# Patient Record
Sex: Female | Born: 1955 | Race: White | Hispanic: No | Smoking: Never smoker
Health system: Southern US, Community
[De-identification: ages and names within clinical notes are randomized; demographics above are authoritative.]

## PROBLEM LIST (undated history)

## (undated) DIAGNOSIS — K219 Gastro-esophageal reflux disease without esophagitis: Secondary | ICD-10-CM

## (undated) DIAGNOSIS — I10 Essential (primary) hypertension: Secondary | ICD-10-CM

## (undated) DIAGNOSIS — N289 Disorder of kidney and ureter, unspecified: Secondary | ICD-10-CM

## (undated) DIAGNOSIS — F32A Depression, unspecified: Secondary | ICD-10-CM

## (undated) DIAGNOSIS — T7840XA Allergy, unspecified, initial encounter: Secondary | ICD-10-CM

## (undated) DIAGNOSIS — R011 Cardiac murmur, unspecified: Secondary | ICD-10-CM

## (undated) DIAGNOSIS — D649 Anemia, unspecified: Secondary | ICD-10-CM

## (undated) DIAGNOSIS — E039 Hypothyroidism, unspecified: Secondary | ICD-10-CM

## (undated) DIAGNOSIS — J45909 Unspecified asthma, uncomplicated: Secondary | ICD-10-CM

## (undated) DIAGNOSIS — E559 Vitamin D deficiency, unspecified: Secondary | ICD-10-CM

## (undated) DIAGNOSIS — K5792 Diverticulitis of intestine, part unspecified, without perforation or abscess without bleeding: Secondary | ICD-10-CM

## (undated) DIAGNOSIS — F329 Major depressive disorder, single episode, unspecified: Secondary | ICD-10-CM

## (undated) DIAGNOSIS — E785 Hyperlipidemia, unspecified: Secondary | ICD-10-CM

## (undated) DIAGNOSIS — G4700 Insomnia, unspecified: Secondary | ICD-10-CM

## (undated) DIAGNOSIS — N200 Calculus of kidney: Secondary | ICD-10-CM

## (undated) DIAGNOSIS — F419 Anxiety disorder, unspecified: Secondary | ICD-10-CM

## (undated) DIAGNOSIS — J309 Allergic rhinitis, unspecified: Secondary | ICD-10-CM

## (undated) DIAGNOSIS — Z780 Asymptomatic menopausal state: Secondary | ICD-10-CM

## (undated) DIAGNOSIS — K222 Esophageal obstruction: Secondary | ICD-10-CM

## (undated) DIAGNOSIS — K449 Diaphragmatic hernia without obstruction or gangrene: Secondary | ICD-10-CM

## (undated) HISTORY — DX: Depression, unspecified: F32.A

## (undated) HISTORY — DX: Cardiac murmur, unspecified: R01.1

## (undated) HISTORY — DX: Essential (primary) hypertension: I10

## (undated) HISTORY — DX: Insomnia, unspecified: G47.00

## (undated) HISTORY — DX: Gastro-esophageal reflux disease without esophagitis: K21.9

## (undated) HISTORY — DX: Allergic rhinitis, unspecified: J30.9

## (undated) HISTORY — DX: Vitamin D deficiency, unspecified: E55.9

## (undated) HISTORY — PX: RETINAL DETACHMENT SURGERY: SHX105

## (undated) HISTORY — PX: HEMORRHOID SURGERY: SHX153

## (undated) HISTORY — PX: STOMACH SURGERY: SHX791

## (undated) HISTORY — PX: UPPER GASTROINTESTINAL ENDOSCOPY: SHX188

## (undated) HISTORY — DX: Unspecified asthma, uncomplicated: J45.909

## (undated) HISTORY — DX: Anemia, unspecified: D64.9

## (undated) HISTORY — DX: Calculus of kidney: N20.0

## (undated) HISTORY — DX: Hypothyroidism, unspecified: E03.9

## (undated) HISTORY — DX: Asymptomatic menopausal state: Z78.0

## (undated) HISTORY — DX: Esophageal obstruction: K22.2

## (undated) HISTORY — DX: Diaphragmatic hernia without obstruction or gangrene: K44.9

## (undated) HISTORY — DX: Allergy, unspecified, initial encounter: T78.40XA

## (undated) HISTORY — DX: Disorder of kidney and ureter, unspecified: N28.9

## (undated) HISTORY — DX: Diverticulitis of intestine, part unspecified, without perforation or abscess without bleeding: K57.92

## (undated) HISTORY — DX: Major depressive disorder, single episode, unspecified: F32.9

## (undated) HISTORY — DX: Hyperlipidemia, unspecified: E78.5

## (undated) HISTORY — DX: Anxiety disorder, unspecified: F41.9

## (undated) HISTORY — PX: FOOT TENDON SURGERY: SHX958

---

## 1997-08-05 HISTORY — PX: NASAL SINUS SURGERY: SHX719

## 1998-03-10 ENCOUNTER — Other Ambulatory Visit: Admission: RE | Admit: 1998-03-10 | Discharge: 1998-03-10 | Payer: Self-pay | Admitting: Gynecology

## 1998-09-15 ENCOUNTER — Other Ambulatory Visit: Admission: RE | Admit: 1998-09-15 | Discharge: 1998-09-15 | Payer: Self-pay | Admitting: Gynecology

## 1998-11-07 ENCOUNTER — Ambulatory Visit (HOSPITAL_COMMUNITY): Admission: RE | Admit: 1998-11-07 | Discharge: 1998-11-07 | Payer: Self-pay | Admitting: Internal Medicine

## 1999-01-24 ENCOUNTER — Other Ambulatory Visit: Admission: RE | Admit: 1999-01-24 | Discharge: 1999-01-24 | Payer: Self-pay | Admitting: Gynecology

## 1999-03-02 DIAGNOSIS — K644 Residual hemorrhoidal skin tags: Secondary | ICD-10-CM | POA: Insufficient documentation

## 2000-03-25 DIAGNOSIS — N390 Urinary tract infection, site not specified: Secondary | ICD-10-CM | POA: Insufficient documentation

## 2001-12-03 DIAGNOSIS — R079 Chest pain, unspecified: Secondary | ICD-10-CM | POA: Insufficient documentation

## 2001-12-03 DIAGNOSIS — R1013 Epigastric pain: Secondary | ICD-10-CM | POA: Insufficient documentation

## 2001-12-03 DIAGNOSIS — R5383 Other fatigue: Secondary | ICD-10-CM | POA: Insufficient documentation

## 2002-03-04 DIAGNOSIS — M722 Plantar fascial fibromatosis: Secondary | ICD-10-CM | POA: Insufficient documentation

## 2003-11-10 DIAGNOSIS — G4700 Insomnia, unspecified: Secondary | ICD-10-CM | POA: Insufficient documentation

## 2003-11-10 DIAGNOSIS — J309 Allergic rhinitis, unspecified: Secondary | ICD-10-CM | POA: Insufficient documentation

## 2003-11-10 DIAGNOSIS — F329 Major depressive disorder, single episode, unspecified: Secondary | ICD-10-CM | POA: Insufficient documentation

## 2003-11-10 DIAGNOSIS — F32A Depression, unspecified: Secondary | ICD-10-CM | POA: Insufficient documentation

## 2003-11-10 DIAGNOSIS — N201 Calculus of ureter: Secondary | ICD-10-CM | POA: Insufficient documentation

## 2004-10-04 DIAGNOSIS — T675XXA Heat exhaustion, unspecified, initial encounter: Secondary | ICD-10-CM | POA: Insufficient documentation

## 2005-03-21 ENCOUNTER — Ambulatory Visit: Payer: Self-pay | Admitting: Internal Medicine

## 2005-04-04 ENCOUNTER — Ambulatory Visit: Payer: Self-pay | Admitting: Internal Medicine

## 2005-05-09 ENCOUNTER — Ambulatory Visit: Payer: Self-pay | Admitting: Internal Medicine

## 2005-08-05 HISTORY — PX: NISSEN FUNDOPLICATION: SHX2091

## 2005-08-06 ENCOUNTER — Ambulatory Visit (HOSPITAL_COMMUNITY): Admission: RE | Admit: 2005-08-06 | Discharge: 2005-08-07 | Payer: Self-pay | Admitting: Surgery

## 2005-09-18 ENCOUNTER — Encounter: Admission: RE | Admit: 2005-09-18 | Discharge: 2005-09-18 | Payer: Self-pay | Admitting: Surgery

## 2006-02-27 DIAGNOSIS — R202 Paresthesia of skin: Secondary | ICD-10-CM | POA: Insufficient documentation

## 2006-02-27 DIAGNOSIS — D239 Other benign neoplasm of skin, unspecified: Secondary | ICD-10-CM | POA: Insufficient documentation

## 2006-02-27 DIAGNOSIS — M542 Cervicalgia: Secondary | ICD-10-CM | POA: Insufficient documentation

## 2006-10-22 DIAGNOSIS — G5600 Carpal tunnel syndrome, unspecified upper limb: Secondary | ICD-10-CM | POA: Insufficient documentation

## 2007-04-23 DIAGNOSIS — E039 Hypothyroidism, unspecified: Secondary | ICD-10-CM | POA: Insufficient documentation

## 2008-04-21 DIAGNOSIS — L700 Acne vulgaris: Secondary | ICD-10-CM | POA: Insufficient documentation

## 2010-06-15 DIAGNOSIS — G4700 Insomnia, unspecified: Secondary | ICD-10-CM

## 2010-06-15 DIAGNOSIS — K644 Residual hemorrhoidal skin tags: Secondary | ICD-10-CM

## 2010-06-15 DIAGNOSIS — N201 Calculus of ureter: Secondary | ICD-10-CM

## 2010-06-15 DIAGNOSIS — N390 Urinary tract infection, site not specified: Secondary | ICD-10-CM

## 2010-06-15 DIAGNOSIS — E669 Obesity, unspecified: Secondary | ICD-10-CM

## 2010-06-15 DIAGNOSIS — T675XXA Heat exhaustion, unspecified, initial encounter: Secondary | ICD-10-CM

## 2010-06-15 DIAGNOSIS — E039 Hypothyroidism, unspecified: Secondary | ICD-10-CM

## 2010-06-15 DIAGNOSIS — M542 Cervicalgia: Secondary | ICD-10-CM

## 2010-06-15 DIAGNOSIS — J309 Allergic rhinitis, unspecified: Secondary | ICD-10-CM

## 2010-06-15 DIAGNOSIS — R1013 Epigastric pain: Secondary | ICD-10-CM

## 2010-06-15 DIAGNOSIS — J3089 Other allergic rhinitis: Secondary | ICD-10-CM

## 2010-06-15 DIAGNOSIS — G5600 Carpal tunnel syndrome, unspecified upper limb: Secondary | ICD-10-CM

## 2010-06-15 DIAGNOSIS — L700 Acne vulgaris: Secondary | ICD-10-CM

## 2010-06-15 DIAGNOSIS — L910 Hypertrophic scar: Secondary | ICD-10-CM

## 2010-06-15 DIAGNOSIS — R202 Paresthesia of skin: Secondary | ICD-10-CM

## 2010-06-15 DIAGNOSIS — E785 Hyperlipidemia, unspecified: Secondary | ICD-10-CM

## 2010-06-15 DIAGNOSIS — M722 Plantar fascial fibromatosis: Secondary | ICD-10-CM

## 2010-06-15 DIAGNOSIS — I10 Essential (primary) hypertension: Secondary | ICD-10-CM

## 2010-06-15 DIAGNOSIS — R5383 Other fatigue: Secondary | ICD-10-CM

## 2010-06-15 DIAGNOSIS — M949 Disorder of cartilage, unspecified: Secondary | ICD-10-CM

## 2010-06-15 DIAGNOSIS — R079 Chest pain, unspecified: Secondary | ICD-10-CM

## 2010-06-15 DIAGNOSIS — F329 Major depressive disorder, single episode, unspecified: Secondary | ICD-10-CM

## 2010-08-25 ENCOUNTER — Other Ambulatory Visit: Payer: Self-pay | Admitting: Allergy

## 2010-08-25 ENCOUNTER — Ambulatory Visit
Admission: RE | Admit: 2010-08-25 | Discharge: 2010-08-25 | Disposition: A | Discharge status: Home / Self Care | Payer: BC Managed Care – PPO | Source: Ambulatory Visit | Attending: Allergy | Admitting: Allergy

## 2010-08-25 DIAGNOSIS — J019 Acute sinusitis, unspecified: Secondary | ICD-10-CM

## 2010-08-25 DIAGNOSIS — J209 Acute bronchitis, unspecified: Secondary | ICD-10-CM

## 2011-04-24 ENCOUNTER — Encounter: Payer: Self-pay | Admitting: Internal Medicine

## 2011-04-24 ENCOUNTER — Ambulatory Visit (INDEPENDENT_AMBULATORY_CARE_PROVIDER_SITE_OTHER): Payer: BC Managed Care – PPO | Admitting: Internal Medicine

## 2011-04-24 ENCOUNTER — Ambulatory Visit (HOSPITAL_BASED_OUTPATIENT_CLINIC_OR_DEPARTMENT_OTHER)
Admission: RE | Admit: 2011-04-24 | Discharge: 2011-04-24 | Disposition: A | Discharge status: Home / Self Care | Payer: BC Managed Care – PPO | Source: Ambulatory Visit | Attending: Internal Medicine | Admitting: Internal Medicine

## 2011-04-24 ENCOUNTER — Other Ambulatory Visit: Payer: Self-pay | Admitting: Internal Medicine

## 2011-04-24 VITALS — BP 140/70 | HR 102 | Temp 97.9°F | Resp 12 | Ht 64.5 in | Wt 218.1 lb

## 2011-04-24 DIAGNOSIS — B009 Herpesviral infection, unspecified: Secondary | ICD-10-CM | POA: Insufficient documentation

## 2011-04-24 DIAGNOSIS — D649 Anemia, unspecified: Secondary | ICD-10-CM | POA: Insufficient documentation

## 2011-04-24 DIAGNOSIS — E039 Hypothyroidism, unspecified: Secondary | ICD-10-CM

## 2011-04-24 DIAGNOSIS — Z1231 Encounter for screening mammogram for malignant neoplasm of breast: Secondary | ICD-10-CM | POA: Insufficient documentation

## 2011-04-24 DIAGNOSIS — N951 Menopausal and female climacteric states: Secondary | ICD-10-CM

## 2011-04-24 DIAGNOSIS — F419 Anxiety disorder, unspecified: Secondary | ICD-10-CM | POA: Insufficient documentation

## 2011-04-24 DIAGNOSIS — I10 Essential (primary) hypertension: Secondary | ICD-10-CM

## 2011-04-24 DIAGNOSIS — Z780 Asymptomatic menopausal state: Secondary | ICD-10-CM

## 2011-04-24 NOTE — Patient Instructions (Signed)
Schedule CPE with me   Labs will be mailed to you
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
## 2011-04-25 LAB — CBC WITH DIFFERENTIAL/PLATELET
Basophils Absolute: 0.1 10*3/uL (ref 0.0–0.1)
Basophils Relative: 1 % (ref 0–1)
Eosinophils Relative: 4 % (ref 0–5)
Lymphocytes Relative: 25 % (ref 12–46)
MCHC: 32.7 g/dL (ref 30.0–36.0)
MCV: 92.6 fL (ref 78.0–100.0)
Neutro Abs: 4.5 10*3/uL (ref 1.7–7.7)
Platelets: 321 10*3/uL (ref 150–400)
RDW: 12.7 % (ref 11.5–15.5)
WBC: 7.3 10*3/uL (ref 4.0–10.5)

## 2011-04-25 LAB — COMPREHENSIVE METABOLIC PANEL
ALT: 13 U/L (ref 0–35)
AST: 27 U/L (ref 0–37)
Alkaline Phosphatase: 92 U/L (ref 39–117)
Calcium: 9.6 mg/dL (ref 8.4–10.5)
Chloride: 101 mEq/L (ref 96–112)
Creat: 0.97 mg/dL (ref 0.50–1.10)

## 2011-04-25 LAB — TSH: TSH: 3.245 u[IU]/mL (ref 0.350–4.500)

## 2011-04-25 LAB — LIPID PANEL
HDL: 52 mg/dL (ref >39)
LDL Cholesterol: 162 mg/dL — ABNORMAL HIGH (ref 0–99)
Total CHOL/HDL Ratio: 4.6 Ratio
VLDL: 25 mg/dL (ref 0–40)

## 2011-04-25 LAB — VITAMIN D 25 HYDROXY (VIT D DEFICIENCY, FRACTURES): Vit D, 25-Hydroxy: 20 ng/mL — ABNORMAL LOW (ref 30–89)

## 2011-04-30 ENCOUNTER — Telehealth: Payer: Self-pay | Admitting: Internal Medicine

## 2011-04-30 MED ORDER — VITAMIN D3 1.25 MG (50000 UT) PO CAPS: 1.0000 | ORAL_CAPSULE | ORAL | Status: DC

## 2011-04-30 NOTE — Telephone Encounter (Signed)
Call pt and let her know that her vitamin D is very low and I am going to give her 50,000 units once a week for 8 weeks.  Have her keep her April appt with me and we will discuss high cholesterol  OK for her to take fish oil 2 gms daily

## 2011-04-30 NOTE — Telephone Encounter (Signed)
Left message on answering machine for [name] to return call to the office

## 2011-05-01 NOTE — Telephone Encounter (Signed)
Marcia Adams came into the office this morning.  Explained vitamin D supplement and fish oil, she is agreeable.  Will keep appt in April

## 2011-05-16 ENCOUNTER — Encounter: Payer: Self-pay | Admitting: Internal Medicine

## 2011-05-16 ENCOUNTER — Ambulatory Visit (INDEPENDENT_AMBULATORY_CARE_PROVIDER_SITE_OTHER): Payer: BC Managed Care – PPO | Admitting: Internal Medicine

## 2011-05-16 VITALS — BP 132/98 | HR 72 | Temp 98.5°F | Ht 64.5 in | Wt 214.1 lb

## 2011-05-16 DIAGNOSIS — F329 Major depressive disorder, single episode, unspecified: Secondary | ICD-10-CM

## 2011-05-16 DIAGNOSIS — Z124 Encounter for screening for malignant neoplasm of cervix: Secondary | ICD-10-CM

## 2011-05-16 DIAGNOSIS — Z1151 Encounter for screening for human papillomavirus (HPV): Secondary | ICD-10-CM

## 2011-05-16 DIAGNOSIS — F411 Generalized anxiety disorder: Secondary | ICD-10-CM

## 2011-05-16 DIAGNOSIS — Z23 Encounter for immunization: Secondary | ICD-10-CM

## 2011-05-16 DIAGNOSIS — E039 Hypothyroidism, unspecified: Secondary | ICD-10-CM

## 2011-05-16 DIAGNOSIS — Z01419 Encounter for gynecological examination (general) (routine) without abnormal findings: Secondary | ICD-10-CM

## 2011-05-16 DIAGNOSIS — F419 Anxiety disorder, unspecified: Secondary | ICD-10-CM

## 2011-05-16 DIAGNOSIS — E785 Hyperlipidemia, unspecified: Secondary | ICD-10-CM

## 2011-05-16 DIAGNOSIS — I10 Essential (primary) hypertension: Secondary | ICD-10-CM

## 2011-05-16 DIAGNOSIS — M6790 Unspecified disorder of synovium and tendon, unspecified site: Secondary | ICD-10-CM

## 2011-05-16 DIAGNOSIS — Z139 Encounter for screening, unspecified: Secondary | ICD-10-CM

## 2011-05-16 DIAGNOSIS — M6789 Other specified disorders of synovium and tendon, multiple sites: Secondary | ICD-10-CM

## 2011-05-16 MED ORDER — SIMVASTATIN 10 MG PO TABS: 10.0000 mg | ORAL_TABLET | Freq: Every day | ORAL | Status: DC

## 2011-05-16 MED ORDER — ESTROGENS, CONJUGATED 0.625 MG/GM VA CREA: TOPICAL_CREAM | VAGINAL | Status: DC

## 2011-05-16 MED ORDER — HYDROCHLOROTHIAZIDE 50 MG PO TABS: 50.0000 mg | ORAL_TABLET | Freq: Every day | ORAL | Status: DC

## 2011-05-16 MED ORDER — LEVOTHYROXINE SODIUM 75 MCG PO TABS: 75.0000 ug | ORAL_TABLET | Freq: Every day | ORAL | Status: DC

## 2011-05-16 MED ORDER — POTASSIUM CHLORIDE CRYS ER 20 MEQ PO TBCR: 20.0000 meq | EXTENDED_RELEASE_TABLET | Freq: Every day | ORAL | Status: DC

## 2011-05-16 NOTE — Progress Notes (Signed)
Subjective:    Patient ID: [name], female    DOB: [birth year], 56 y.o.   MRN: [id number]  HPI  Marcia Adams Is here for comprehensive eval. overall patient is doing well. She does note that she has had a nodule on the back part of her right hand that has been present for 2-3 days. She denies injury or trauma to her right hand. The nodule is easily movable.  She is wondering if the nodule is a lipoma.  Patient reports that she is continuing to lose weight off her Remeron.  She has never had a colonoscopy. She has seen Dr. Julio Alm in the past.  She also reports that she has been account of her simvastatin for approximately the past 6 months  She is unsure when she had her last tetanus vaccine. She does work as a Runner, broadcasting/film/video and would like to be updated on her pertussis vaccine.  She has been on Premarin initially prescribed to her eye Dr. Elana Alm that she has been using vaginally but she reports that she uses it very rarely as her boyfriend is out of town living in [place].  She has not seen Dr.Kaur as yet about her adjusting her Cymbalta dose.  She also reports that she had been on spironolactone with her HCTZ in the past for her blood pressure She reports stopping spironolactone around 1 year ago.   See lab results her potassium is on the low end of normal.  She does take 50 mg of hydrochlorothiazide daily.   Patient also reports she was unable to tolerate her vitamin D as the high dose vitamin D made her nauseated.    Review of Systems  Constitutional: Negative.   HENT: Negative.   Eyes: Negative.   Respiratory: Negative.   Cardiovascular: Negative.   Gastrointestinal: Negative.   Genitourinary: Negative.   Musculoskeletal: Negative.   Skin: Negative.   Neurological: Negative.   Hematological: Bruises/bleeds easily.  Psychiatric/Behavioral: The patient is nervous/anxious.        Objective:   Physical Exam  Physical Exam  Vital signs and nursing note reviewed    Constitutional: She is oriented to person, place, and time. She appears well-developed and well-nourished. She is cooperative.  HENT:  Head: Normocephalic and atraumatic.  Right Ear: Tympanic membrane normal.  Left Ear: Tympanic membrane normal.  Nose: Nose normal.  Mouth/Throat: Oropharynx is clear and moist and mucous membranes are normal. No oropharyngeal exudate or posterior oropharyngeal erythema.  Eyes: Conjunctivae and EOM are normal. Pupils are equal, round, and reactive to light.  Neck: Neck supple. No JVD present. Carotid bruit is not present. No mass and no thyromegaly present.  Cardiovascular: Regular rhythm, normal heart sounds, intact distal pulses and normal pulses.  Exam reveals no gallop and no friction rub.   No murmur heard. Pulses:      Dorsalis pedis pulses are 2+ on the right side, and 2+ on the left side.  Pulmonary/Chest: Breath sounds normal. She has no wheezes. She has no rhonchi. She has no rales. Right breast exhibits no mass, no nipple discharge and no skin change. Left breast exhibits no mass, no nipple discharge and no skin change.  Abdominal: Soft. Bowel sounds are normal. She exhibits no distension and no mass. There is no hepatosplenomegaly. There is no tenderness. There is no CVA tenderness.  Genitourinary: Rectum normal, vagina normal and uterus normal. Rectal exam shows no mass. Guaiac negative stool. No labial fusion. There is no lesion on the right labia. There  is no lesion on the left labia. Cervix exhibits no motion tenderness. Right adnexum displays no mass, no tenderness and no fullness. Left adnexum displays no mass, no tenderness and no fullness. No erythema around the vagina.  Musculoskeletal:       No active synovitis to any joint.    Lymphadenopathy:       Right cervical: No superficial cervical adenopathy present.      Left cervical: No superficial cervical adenopathy present.       Right axillary: No pectoral and no lateral adenopathy present.        Left axillary: No pectoral and no lateral adenopathy present.      Right: No inguinal adenopathy present.       Left: No inguinal adenopathy present.  Neurological: She is alert and oriented to person, place, and time. She has normal strength and normal reflexes. No cranial nerve deficit or sensory deficit. She displays a negative Romberg sign. Coordination and gait normal.  Skin: Skin is warm and dry. No abrasion, no bruising, no ecchymosis and no rash noted. No cyanosis.  Healing area on upper chest wall Nails show no clubbing.  Psychiatric: She has a normal mood and affect. Her speech is normal and behavior is normal.          Assessment & Plan:  #1 health maintenance.   See  scanned health maintenance sheet. 5r Will refer to Dr. Julio Alm her gastroenterologist as she has never had a colonoscopy. I stressed the importance of getting his colonoscopy exam.  #2 nodule on right hand. We'll refer to hand surgeon for further evaluation. #3 hypertension well controlled on current medication. She stopped her spironolactone and her potassium is low normal. Will add K-Dur 20 mEq 1 daily shift. She is to return to see me in 3 months. #4 hyperlipidemia. She has been out of her Zocor for the past 6 months. Will restart Zocor at 10 mg dose number one daily. She is aware to report any complaints of muscle pain and I will recheck her liver function tests in 3 months. #5 allergic rhinitis well controlled on current therapy. #6 depression managed by Dr. Evelene Croon. #7 atrophic vaginitis. Will prescribe low dose Premarin vaginally 2 times per week. #8 hypothyroidism. Refilled her Synthroid today. #9 reflux esophagitis. I discussed with patient before trying a baby aspirin to talk with Dr. Dickie La about this.  We will administer T. dap today.       Assessment & Plan:

## 2011-05-16 NOTE — Patient Instructions (Signed)
Make appointment with Dr. Juanda Chance for colonoscopy  We will refer to hand surgeon  See me in 3 momths  Take simvastatin for cholesterol daily

## 2011-05-22 ENCOUNTER — Ambulatory Visit: Payer: BC Managed Care – PPO | Admitting: Internal Medicine

## 2011-05-26 ENCOUNTER — Encounter: Payer: Self-pay | Admitting: Internal Medicine

## 2011-05-26 DIAGNOSIS — N952 Postmenopausal atrophic vaginitis: Secondary | ICD-10-CM | POA: Insufficient documentation

## 2011-06-05 ENCOUNTER — Other Ambulatory Visit: Payer: Self-pay | Admitting: Orthopedic Surgery

## 2011-06-07 NOTE — H&P (Signed)
   Marcia Adams returns with a new predicament involving her right hand.   She has one or two mobile cystic masses on the dorsal aspect of the hand between her index and long finger metacarpals.  These either feel to be myxoid cyst or a hemangioma.  They do not move with her tendons.  They are minimally tender, but "icky" for her to move and feel.    She would like to have these evaluated.   PAST MEDICAL HISTORY:  Updated and she has no drug allergies.    MEDICATIONS:  Claritin, acyclovir, Wellbutrin, Cymbalta, simvastatin, Lunesta, lorazepam, HCTZ, levothyroxine, Dymista, D-3 vitamin supplements, Tazorac and calcium supplements.  SURGICAL HISTORY:   Sinus surgery, gastric surgery, Nissan fundoplication.  SOCIAL HISTORY:  She is divorced.  She is a nonsmoker.  She does not drink alcoholic beverages.    FAMILY MEDICAL HISTORY:  Detailed and noncontributory.    REVIEW OF SYSTEMS:   14-point review of systems detailed and otherwise noncontributory.    PHYSICAL EXAMINATION:  She is a well appearing 56 year-old woman.  Inspection of her hand reveals no visible deformity.  On palpation she has apple seed size masses x 2 which are consistent with a myxoid cyst or hemangioma.  We had a long discussion regarding the merits of excisional biopsy.  A biopsy would define her diagnosis and provide a prognosis.    We cannot provide either without a biopsy.  After age 69 it is my habit to biopsy most lumps.  She will schedule this at a mutually convenient time.   Plan:  Excisional biopsy of masses right dorsal hand.            Surgery, aftercare and expected outcome discussed in detail.            Questions invited and answered.  H&P documentation: 06/08/2011  -History and Physical Reviewed  -Patient has been re-examined  -No change in the plan of care  Wyn Forster, MD

## 2011-06-07 NOTE — Discharge Instructions (Signed)

## 2011-06-08 ENCOUNTER — Ambulatory Visit (HOSPITAL_BASED_OUTPATIENT_CLINIC_OR_DEPARTMENT_OTHER)
Admission: RE | Admit: 2011-06-08 | Discharge: 2011-06-08 | Disposition: A | Discharge status: Home / Self Care | Payer: BC Managed Care – PPO | Source: Ambulatory Visit | Attending: Orthopedic Surgery | Admitting: Orthopedic Surgery

## 2011-06-08 ENCOUNTER — Encounter (HOSPITAL_BASED_OUTPATIENT_CLINIC_OR_DEPARTMENT_OTHER)
Admission: RE | Disposition: A | Discharge status: Home / Self Care | Payer: Self-pay | Source: Ambulatory Visit | Attending: Orthopedic Surgery

## 2011-06-08 ENCOUNTER — Encounter (HOSPITAL_BASED_OUTPATIENT_CLINIC_OR_DEPARTMENT_OTHER): Payer: Self-pay

## 2011-06-08 DIAGNOSIS — D1779 Benign lipomatous neoplasm of other sites: Secondary | ICD-10-CM | POA: Insufficient documentation

## 2011-06-08 HISTORY — PX: MASS EXCISION: SHX2000

## 2011-06-08 SURGERY — MINOR EXCISION OF MASS
Anesthesia: LOCAL | Site: Hand | Laterality: Right | Wound class: Clean

## 2011-06-08 MED ORDER — LIDOCAINE HCL 2 % IJ SOLN
INTRAMUSCULAR | Status: DC | PRN
  Administered 2011-06-08: 2 mL

## 2011-06-08 MED ORDER — CHLORHEXIDINE GLUCONATE 4 % EX LIQD: 60.0000 mL | Freq: Once | CUTANEOUS | Status: DC

## 2011-06-08 MED ORDER — HYDROCODONE-ACETAMINOPHEN 5-325 MG PO TABS: ORAL_TABLET | ORAL | Status: AC

## 2011-06-08 SURGICAL SUPPLY — 35 items
BANDAGE ADHESIVE 1X3 (GAUZE/BANDAGES/DRESSINGS) IMPLANT
BLADE SURG 15 STRL LF DISP TIS (BLADE) ×1 IMPLANT
BLADE SURG 15 STRL SS (BLADE) ×2
BNDG CMPR 9X4 STRL LF SNTH (GAUZE/BANDAGES/DRESSINGS)
BNDG CMPR MD 5X2 ELC HKLP STRL (GAUZE/BANDAGES/DRESSINGS)
BNDG COHESIVE 1X5 TAN STRL LF (GAUZE/BANDAGES/DRESSINGS) IMPLANT
BNDG ELASTIC 2 VLCR STRL LF (GAUZE/BANDAGES/DRESSINGS) IMPLANT
BNDG ESMARK 4X9 LF (GAUZE/BANDAGES/DRESSINGS) IMPLANT
BRUSH SCRUB EZ PLAIN DRY (MISCELLANEOUS) ×2 IMPLANT
CLOTH BEACON ORANGE TIMEOUT ST (SAFETY) ×2 IMPLANT
CORDS BIPOLAR (ELECTRODE) IMPLANT
COVER MAYO STAND STRL (DRAPES) ×2 IMPLANT
CUFF TOURNIQUET SINGLE 18IN (TOURNIQUET CUFF) IMPLANT
DECANTER SPIKE VIAL GLASS SM (MISCELLANEOUS) IMPLANT
DRAIN PENROSE 1/2X12 LTX STRL (WOUND CARE) IMPLANT
DRAPE SURG 17X23 STRL (DRAPES) ×2 IMPLANT
GAUZE SPONGE 4X4 12PLY STRL LF (GAUZE/BANDAGES/DRESSINGS) ×4 IMPLANT
GAUZE XEROFORM 1X8 LF (GAUZE/BANDAGES/DRESSINGS) IMPLANT
GLOVE BIOGEL M STRL SZ7.5 (GLOVE) ×2 IMPLANT
GLOVE ORTHO TXT STRL SZ7.5 (GLOVE) ×2 IMPLANT
GOWN PREVENTION PLUS XLARGE (GOWN DISPOSABLE) ×2 IMPLANT
NEEDLE 27GAX1X1/2 (NEEDLE) IMPLANT
PACK BASIN DAY SURGERY FS (CUSTOM PROCEDURE TRAY) ×2 IMPLANT
PADDING CAST ABS 4INX4YD NS (CAST SUPPLIES) ×1
PADDING CAST ABS COTTON 4X4 ST (CAST SUPPLIES) ×1 IMPLANT
SPONGE GAUZE 4X4 12PLY (GAUZE/BANDAGES/DRESSINGS) ×2 IMPLANT
STOCKINETTE 4X48 STRL (DRAPES) ×2 IMPLANT
SUT ETHILON 5 0 P 3 18 (SUTURE) ×1
SUT NYLON ETHILON 5-0 P-3 1X18 (SUTURE) ×1 IMPLANT
SYR 3ML 23GX1 SAFETY (SYRINGE) IMPLANT
SYR CONTROL 10ML LL (SYRINGE) IMPLANT
TOWEL OR 17X24 6PK STRL BLUE (TOWEL DISPOSABLE) ×4 IMPLANT
TRAY DSU PREP LF (CUSTOM PROCEDURE TRAY) ×2 IMPLANT
UNDERPAD 30X30 INCONTINENT (UNDERPADS AND DIAPERS) ×2 IMPLANT
WATER STERILE IRR 1000ML POUR (IV SOLUTION) ×2 IMPLANT

## 2011-06-08 NOTE — Op Note (Signed)
OP NOTE DICTATED [id number]

## 2011-06-08 NOTE — Brief Op Note (Signed)
06/08/2011  9:15 AM  PATIENT:  Marcia Adams  56 y.o. female  PRE-OPERATIVE DIAGNOSIS:  cysts dorsal aspect of  right hand between index and long metacarpals  POST-OPERATIVE DIAGNOSIS:  excision of two lipoid firm masses subfascial right hand  PROCEDURE:  EXCISION OF MASSES DORSUM OF RIGHT HAND   SURGEON:  Wyn Forster., MD   PHYSICIAN ASSISTANT:   ASSISTANTS: Mallory Shirk.A-C   ANESTHESIA:   local  EBL:     BLOOD ADMINISTERED:none  DRAINS: none   LOCAL MEDICATIONS USED:  XYLOCAINE   SPECIMEN:  Biopsy / Limited Resection  DISPOSITION OF SPECIMEN:  PATHOLOGY  COUNTS:  YES  TOURNIQUET:   Total Tourniquet Time Documented: Forearm (Right) - 7 minutes  DICTATION: .Other Dictation: Dictation Number [id number]  PLAN OF CARE: Discharge to home after PACU  PATIENT DISPOSITION:  PACU - hemodynamically stable.

## 2011-06-09 NOTE — Op Note (Signed)
NAME:  Marcia Adams, Marcia Adams              ACCOUNT NO.:  [id number]  MEDICAL RECORD NO.:  [id number]  LOCATION:                                 FACILITY:  PHYSICIAN:  Katy Fitch. Coreen Shippee, M.D. DATE OF BIRTH:  03-22-1955  DATE OF PROCEDURE:  06/08/2011 DATE OF DISCHARGE:                              OPERATIVE REPORT   PREOPERATIVE DIAGNOSIS:  Uncomfortable masses on dorsal aspect of right hand between index and long finger metacarpals.  POSTOPERATIVE DIAGNOSIS:  Probable lipoid masses.  FINAL DIAGNOSIS:  Pending histopathologic evaluation.  OPERATION:  Excision of 2 dorsal masses between index and long finger metacarpals, subfascial, right hand.  OPERATING SURGEON:  Katy Fitch. Mahum Betten, MD  ASSISTANT:  Marveen Reeks Dasnoit, PA-C  ANESTHESIA:  2.5 mL of 2% lidocaine, field block under minor operating room procedure.  INDICATIONS:  Marcia Adams is a 56 year old woman who presented for evaluation of 2 mobile masses on the dorsal aspect of her right hand. This was subfascial between the index and long finger metacarpals.  She had no history of penetrating injury.  Her x-ray was unremarkable.  We advised excisional biopsy for diagnosis and hopeful resolution of this problem.  PROCEDURE:  Marcia Adams was brought to room #1 of the [facility] and placed in supine position upon the operating table.  Following informed consent, got Betadine prep, 2.5 mL of 2% lidocaine was infiltrated into a subfascial region for anesthesia in the region of the intended biopsy.  After 5 minutes, excellent anesthesia was achieved.  The arm was then prepped with Betadine soap and solution, sterilely draped.  A pneumatic tourniquet was applied to the proximal forearm.  Following exsanguination by direct compression, the arterial tourniquet was inflated to 220 mmHg.  After routine surgical time-out, a longitudinal incision was fashioned directly over the palpable mass. Subcutaneous tissues were  carefully divided taking care to identify the dorsal veins, dorsal cutaneous sensory branches of the radial nerve, and the extensors to the index and long fingers.  The fascia was released and 2 masses were identified.  These were lipid in nature, one was quite firm and borderline calcified. The second was attached to pedicle.  This appeared to be some type of dystrophic lipoid mass.  This was excised, placed in formalin, and passed off for pathologic evaluation.  The feeding vessels to this region were electrocauterized with bipolar current.  Care was taken to preserve the dorsal radial sensory branch.  The wound was then repaired with intradermal 4-0 Prolene with a Steri- Strip.  Compressive dressing was applied with sterile gauze and Ace wrap.     Katy Fitch Negin Hegg, M.D.     RVS/MEDQ  D:  06/08/2011  T:  06/08/2011  Job:  [id number]

## 2011-06-11 ENCOUNTER — Encounter (HOSPITAL_BASED_OUTPATIENT_CLINIC_OR_DEPARTMENT_OTHER): Payer: Self-pay | Admitting: Orthopedic Surgery

## 2011-06-12 ENCOUNTER — Telehealth: Payer: Self-pay | Admitting: Internal Medicine

## 2011-06-12 MED ORDER — POTASSIUM CHLORIDE CRYS ER 10 MEQ PO TBCR: EXTENDED_RELEASE_TABLET | ORAL | Status: DC

## 2011-06-12 NOTE — Telephone Encounter (Signed)
Pt called stating there Potassium chloride pill is to big. It's 21 meq and she needs the 10 meq. She would like a new prescription where she gets a month supply of the new size ( doesn't want to pay a big copay if she can't swallow)

## 2011-06-12 NOTE — Telephone Encounter (Signed)
Addended by: Raechel Chute D on: 06/12/2011 01:28 PM   Modules accepted: Orders

## 2011-06-12 NOTE — Telephone Encounter (Signed)
Spoke with pt and informed of pos.  HPV.  Pap is negative.  Counseled pt to be sure to have her pap done yearly  She voices understanding

## 2011-06-13 ENCOUNTER — Encounter: Payer: Self-pay | Admitting: *Deleted

## 2011-06-18 ENCOUNTER — Encounter: Payer: Self-pay | Admitting: Internal Medicine

## 2011-07-17 ENCOUNTER — Ambulatory Visit: Payer: BC Managed Care – PPO | Admitting: Internal Medicine

## 2011-07-20 ENCOUNTER — Ambulatory Visit: Payer: BC Managed Care – PPO | Admitting: Internal Medicine

## 2011-08-13 ENCOUNTER — Encounter: Payer: Self-pay | Admitting: *Deleted

## 2011-08-20 ENCOUNTER — Encounter: Payer: Self-pay | Admitting: Internal Medicine

## 2011-08-28 ENCOUNTER — Encounter: Payer: Self-pay | Admitting: Internal Medicine

## 2011-08-28 ENCOUNTER — Ambulatory Visit (INDEPENDENT_AMBULATORY_CARE_PROVIDER_SITE_OTHER): Payer: BC Managed Care – PPO | Admitting: Internal Medicine

## 2011-08-28 VITALS — BP 128/84 | HR 77 | Temp 97.3°F | Resp 16 | Ht 64.0 in | Wt 210.0 lb

## 2011-08-28 DIAGNOSIS — B977 Papillomavirus as the cause of diseases classified elsewhere: Secondary | ICD-10-CM

## 2011-08-28 DIAGNOSIS — N952 Postmenopausal atrophic vaginitis: Secondary | ICD-10-CM

## 2011-08-28 DIAGNOSIS — I10 Essential (primary) hypertension: Secondary | ICD-10-CM

## 2011-08-28 DIAGNOSIS — E785 Hyperlipidemia, unspecified: Secondary | ICD-10-CM

## 2011-08-28 DIAGNOSIS — E876 Hypokalemia: Secondary | ICD-10-CM

## 2011-08-28 LAB — BASIC METABOLIC PANEL
CO2: 30 mEq/L (ref 19–32)
Chloride: 102 mEq/L (ref 96–112)
Sodium: 142 mEq/L (ref 135–145)
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
## 2011-08-28 NOTE — Patient Instructions (Addendum)
See me in 6 months

## 2011-08-29 ENCOUNTER — Ambulatory Visit: Payer: BC Managed Care – PPO | Admitting: Internal Medicine

## 2011-08-29 ENCOUNTER — Telehealth: Payer: Self-pay | Admitting: *Deleted

## 2011-08-29 NOTE — Telephone Encounter (Signed)
Copy of labs mailed to pt's home address. 

## 2011-10-09 ENCOUNTER — Other Ambulatory Visit: Payer: Self-pay | Admitting: *Deleted

## 2011-10-10 MED ORDER — POTASSIUM CHLORIDE CRYS ER 10 MEQ PO TBCR: EXTENDED_RELEASE_TABLET | ORAL | Status: DC

## 2011-10-10 MED ORDER — ACYCLOVIR 400 MG PO TABS: 400.0000 mg | ORAL_TABLET | Freq: Every day | ORAL | Status: DC

## 2012-01-07 ENCOUNTER — Ambulatory Visit (INDEPENDENT_AMBULATORY_CARE_PROVIDER_SITE_OTHER): Payer: BC Managed Care – PPO | Admitting: Internal Medicine

## 2012-01-07 ENCOUNTER — Encounter: Payer: Self-pay | Admitting: *Deleted

## 2012-01-07 VITALS — BP 134/88 | HR 89 | Temp 97.8°F | Resp 18 | Wt 192.8 lb

## 2012-01-07 DIAGNOSIS — E876 Hypokalemia: Secondary | ICD-10-CM

## 2012-01-07 DIAGNOSIS — E785 Hyperlipidemia, unspecified: Secondary | ICD-10-CM

## 2012-01-07 DIAGNOSIS — B977 Papillomavirus as the cause of diseases classified elsewhere: Secondary | ICD-10-CM

## 2012-01-07 DIAGNOSIS — B009 Herpesviral infection, unspecified: Secondary | ICD-10-CM

## 2012-01-07 DIAGNOSIS — N952 Postmenopausal atrophic vaginitis: Secondary | ICD-10-CM

## 2012-01-07 DIAGNOSIS — I10 Essential (primary) hypertension: Secondary | ICD-10-CM

## 2012-01-07 DIAGNOSIS — Z23 Encounter for immunization: Secondary | ICD-10-CM

## 2012-01-07 LAB — BASIC METABOLIC PANEL
Calcium: 10 mg/dL (ref 8.4–10.5)
Sodium: 143 mEq/L (ref 135–145)

## 2012-01-07 LAB — LIPID PANEL
HDL: 51 mg/dL (ref >39)
LDL Cholesterol: 102 mg/dL — ABNORMAL HIGH (ref 0–99)
Total CHOL/HDL Ratio: 3.5 Ratio
VLDL: 25 mg/dL (ref 0–40)

## 2012-01-07 MED ORDER — HYDROCHLOROTHIAZIDE 50 MG PO TABS: 50.0000 mg | ORAL_TABLET | Freq: Every day | ORAL | Status: DC

## 2012-01-07 MED ORDER — ACYCLOVIR 400 MG PO TABS: 400.0000 mg | ORAL_TABLET | Freq: Every day | ORAL | Status: DC

## 2012-01-07 MED ORDER — ESTRADIOL 10 MCG VA TABS: ORAL_TABLET | VAGINAL | Status: DC

## 2012-01-07 NOTE — Patient Instructions (Addendum)
See me in May 2014 for repeat pap smear

## 2012-01-07 NOTE — Progress Notes (Signed)
Subjective:    Patient ID: Marcia Adams, female    DOB: 07-09-1955, 56 y.o.   MRN: [id number]  HPI  Marcia Adams is here for follow up.  Overall doing well.  Her psychiatric medcations have been adjusted and she is off Cymbalta.  She is very happy as she has been losing weight since Cymbalta has been stopped.    Tolerating BP meds  She tells me she has been taking her K pill daily with her HCTZ  She would like refill on vaginal estrogen   Allergies  Allergen Reactions  . Aripiprazole Other (See Comments)    Excessive weight gain  . Escitalopram Oxalate   . Shrimp (Shellfish Allergy) Other (See Comments)    On allergy testing only  . Enalapril Rash   Past Medical History  Diagnosis Date  . Depression   . Hypertension   . GERD (gastroesophageal reflux disease)   . Hyperlipidemia   . Menopause   . Thyroid disease   . Insomnia   . Anemia   . Anxiety   . Vitamin D deficiency   . Hiatal hernia   . Esophageal stricture    Past Surgical History  Procedure Date  . Nasal sinus surgery 7/99  . Nissen fundoplication 7/07  . Hemorrhoid surgery   . Mass excision 06/08/2011    Procedure: MINOR EXCISION OF MASS;  Surgeon: Wyn Forster., MD;  Location: [facility];  Service: Orthopedics;  Laterality: Right;  excisional biopsy dorsal right hand   History   Social History  . Marital Status: Divorced    Spouse Name: N/A    Number of Children: N/A  . Years of Education: N/A   Occupational History  . Not on file.   Social History Main Topics  . Smoking status: Never Smoker   . Smokeless tobacco: Never Used  . Alcohol Use: Yes     Comment: occasionally  . Drug Use: No  . Sexually Active: Yes   Other Topics Concern  . Not on file   Social History Narrative  . No narrative on file   Family History  Problem Relation Age of Onset  . COPD Mother   . COPD Father   . Urolithiasis Brother   . Arthritis Maternal Grandmother   . Depression Brother   .  Kidney cancer Maternal Grandfather    Patient Active Problem List  Diagnosis  . Acne vulgaris  . Keloid  . Hypothyroidism  . Carpal tunnel syndrome  . Benign neoplasm of skin  . Pain, neck  . Paresthesia  . Reflux esophagitis  . Obesity  . Heat exhaustion  . Depression  . Allergic rhinitis, cause unspecified  . Calculus of ureter  . Insomnia  . Disorder of bone and cartilage, unspecified  . Plantar fasciitis  . Hyperlipidemia  . Fatigue  . Pain in the chest  . Pain, abdominal, epigastric  . UTI (urinary tract infection)  . Hemorrhoids, external  . HTN (hypertension)  . Allergic rhinitis due to dust  . Anemia  . Anxiety  . Herpes  . Postmenopausal atrophic vaginitis  . HPV in female   Current Outpatient Prescriptions on File Prior to Visit  Medication Sig Dispense Refill  . buPROPion (WELLBUTRIN XL) 150 MG 24 hr tablet Take 300 mg by mouth daily.       . calcium-vitamin D (OSCAL WITH D) 500-200 MG-UNIT per tablet Take 1 tablet by mouth daily.        . Cholecalciferol (VITAMIN  D3) 50000 UNITS CAPS Take 1 capsule by mouth once a week.  8 capsule  0  . DYMISTA 137-50 MCG/ACT SUSP Place 1 spray into the nose Twice daily. Each nostril      . KLOR-CON 10 10 MEQ tablet       . levothyroxine (SYNTHROID, LEVOTHROID) 75 MCG tablet Take 1 tablet (75 mcg total) by mouth daily. Take one tablet once daily for thyroid supplement.  90 tablet  3  . loratadine (CLARITIN) 10 MG tablet Take one tablet once daily as needed for allergies.       Marland Kitchen LORazepam (ATIVAN) 1 MG tablet Take two tablets nightly for sleep       . potassium chloride (K-DUR,KLOR-CON) 10 MEQ tablet Take two tablets daily  60 tablet  5  . simvastatin (ZOCOR) 10 MG tablet Take 1 tablet (10 mg total) by mouth at bedtime.  90 tablet  3  . tazarotene (AVAGE) 0.1 % cream Apply once nightly for acne       . [DISCONTINUED] hydrochlorothiazide (HYDRODIURIL) 50 MG tablet Take 1 tablet (50 mg total) by mouth daily.  90 tablet  0  .  EPIPEN 2-PAK 0.3 MG/0.3ML DEVI       . Eszopiclone 3 MG TABS Take 3 mg by mouth at bedtime. Take immediately before bedtime      . Probiotic Product (PROBIOTIC FORMULA PO) Take 1 capsule by mouth daily.      . [DISCONTINUED] azelastine (ASTELIN) 137 MCG/SPRAY nasal spray One spray in each nostril twice daily as needed for allergies.       . [DISCONTINUED] cloNIDine (CATAPRES) 0.1 MG tablet Take one tablet up to three times daily as needed to reduce excessive sweating.       . [DISCONTINUED] mirtazapine (REMERON) 15 MG tablet Take one tablet once daily at bedtime       . [DISCONTINUED] spironolactone (ALDACTONE) 50 MG tablet Take one tablet twice daily for acne.          Review of Systems    see HPI Objective:   Physical Exam  Physical Exam  Nursing note and vitals reviewed.  Constitutional: She is oriented to person, place, and time. She appears well-developed and well-nourished.  HENT:  Head: Normocephalic and atraumatic.  Cardiovascular: Normal rate and regular rhythm. Exam reveals no gallop and no friction rub.  No murmur heard.  Pulmonary/Chest: Breath sounds normal. She has no wheezes. She has no rales.  Neurological: She is alert and oriented to person, place, and time.  Skin: Skin is warm and dry.  Psychiatric: She has a normal mood and affect. Her behavior is normal.  Ext no edema            Assessment & Plan:  HTN  Adequate control  Advised she must take HCTZ with her K pill daily  History of Hypokalemia will check today  Atrophic vaginitis  Will d/c premarin and will change to Vagifem 10 mcg once a week.  HPV positive  I counseled pt to be sure to have repeat pap in 2014.  She voices understanding

## 2012-01-09 ENCOUNTER — Telehealth: Payer: Self-pay | Admitting: *Deleted

## 2012-01-09 NOTE — Telephone Encounter (Signed)
Lab results mailed to pt home address

## 2012-01-10 ENCOUNTER — Telehealth: Payer: Self-pay | Admitting: *Deleted

## 2012-01-10 NOTE — Telephone Encounter (Signed)
Advised pt of lab results and to increase her K+ pt will also make a RN appt to begin the hep b series

## 2012-05-12 ENCOUNTER — Telehealth: Payer: Self-pay | Admitting: *Deleted

## 2012-05-12 ENCOUNTER — Other Ambulatory Visit: Payer: Self-pay | Admitting: *Deleted

## 2012-05-12 DIAGNOSIS — E785 Hyperlipidemia, unspecified: Secondary | ICD-10-CM

## 2012-05-12 MED ORDER — SIMVASTATIN 10 MG PO TABS: 10.0000 mg | ORAL_TABLET | Freq: Every day | ORAL | Status: DC

## 2012-05-12 NOTE — Telephone Encounter (Signed)
Refill request

## 2012-05-12 NOTE — Telephone Encounter (Signed)
Pt will come in for lab req.

## 2012-05-12 NOTE — Telephone Encounter (Signed)
Marcia Adams   Call pt and let her know that I refilled only 30 days of her Zocor .  I have not seen her in a  Year and she needs to see me in office before I will refilll anymore than 30 days

## 2012-05-13 ENCOUNTER — Telehealth: Payer: Self-pay | Admitting: *Deleted

## 2012-05-13 DIAGNOSIS — E785 Hyperlipidemia, unspecified: Secondary | ICD-10-CM

## 2012-05-13 LAB — HEPATIC FUNCTION PANEL
Albumin: 4.2 g/dL (ref 3.5–5.2)
Alkaline Phosphatase: 95 U/L (ref 39–117)
Indirect Bilirubin: 0.5 mg/dL (ref 0.0–0.9)
Total Bilirubin: 0.6 mg/dL (ref 0.3–1.2)

## 2012-05-13 LAB — LIPID PANEL
Cholesterol: 215 mg/dL — ABNORMAL HIGH (ref 0–200)
HDL: 57 mg/dL (ref >39)
LDL Cholesterol: 128 mg/dL — ABNORMAL HIGH (ref 0–99)
Total CHOL/HDL Ratio: 3.8 Ratio
Triglycerides: 149 mg/dL (ref <150)
VLDL: 30 mg/dL (ref 0–40)

## 2012-05-14 ENCOUNTER — Encounter: Payer: Self-pay | Admitting: *Deleted

## 2012-05-14 ENCOUNTER — Telehealth: Payer: Self-pay | Admitting: *Deleted

## 2012-05-14 NOTE — Telephone Encounter (Signed)
Message copied by Mathews Robinsons on Wed May 14, 2012  3:33 PM ------      Message from: Raechel Chute D      Created: Wed May 14, 2012  1:37 PM       Ok to mail labs to pt ------

## 2012-05-15 ENCOUNTER — Telehealth: Payer: Self-pay | Admitting: Internal Medicine

## 2012-05-15 MED ORDER — SIMVASTATIN 10 MG PO TABS: 10.0000 mg | ORAL_TABLET | Freq: Every day | ORAL | Status: DC

## 2012-05-15 NOTE — Telephone Encounter (Signed)
Ardenia    Call pt and let her know that I will order a 90 day supply of her cholesterol medication and send to her pharmacy  Advise  her she is due for her pap smear and schedule a 30 min appt for pap exam.  Message back with appt date to me  I sent 90 day supply of cholesterol med to [facility]  Thanks

## 2012-05-15 NOTE — Telephone Encounter (Signed)
Pt states she would like the results of her blood work... She states she needs to know if she needs to get her cholesterol... She can be reach at [phone]

## 2012-05-16 NOTE — Telephone Encounter (Signed)
Dr. Reece Levy.. Pt was called thru cell number at 807 am 04.11.14... She states she will call back later today to schedule appointment about the pap smear... Pt is aware of her meds going to [facility].Marland Kitchen

## 2012-05-19 ENCOUNTER — Telehealth: Payer: Self-pay | Admitting: *Deleted

## 2012-05-19 NOTE — Telephone Encounter (Signed)
Pt called about her hepatic function results and whether or not to pick up meds

## 2012-05-19 NOTE — Telephone Encounter (Signed)
Labs only

## 2012-05-19 NOTE — Telephone Encounter (Signed)
error 

## 2012-06-13 ENCOUNTER — Other Ambulatory Visit: Payer: Self-pay | Admitting: Internal Medicine

## 2012-06-16 NOTE — Telephone Encounter (Signed)
Error

## 2012-06-17 ENCOUNTER — Other Ambulatory Visit: Payer: Self-pay | Admitting: *Deleted

## 2012-06-17 MED ORDER — SIMVASTATIN 10 MG PO TABS: 10.0000 mg | ORAL_TABLET | Freq: Every day | ORAL | Status: DC

## 2012-06-17 NOTE — Telephone Encounter (Signed)
Pt assured only received 30 day supply of zocor re ordered for a 90 day supply

## 2012-06-26 ENCOUNTER — Other Ambulatory Visit: Payer: Self-pay | Admitting: Internal Medicine

## 2012-06-26 MED ORDER — LEVOTHYROXINE SODIUM 75 MCG PO TABS: 75.0000 ug | ORAL_TABLET | Freq: Every day | ORAL | Status: DC

## 2012-06-26 NOTE — Telephone Encounter (Signed)
Pt request refill on Levothyoxine 75 mcg.  Pharmacy [facility]; [phone].  Pt call back phone number [phone].

## 2012-06-26 NOTE — Telephone Encounter (Signed)
Refill request

## 2012-07-14 ENCOUNTER — Other Ambulatory Visit: Payer: Self-pay | Admitting: *Deleted

## 2012-07-14 MED ORDER — ACYCLOVIR 400 MG PO TABS: 400.0000 mg | ORAL_TABLET | Freq: Every day | ORAL | Status: DC

## 2012-07-14 NOTE — Telephone Encounter (Signed)
Refill request

## 2012-07-21 ENCOUNTER — Other Ambulatory Visit: Payer: Self-pay | Admitting: *Deleted

## 2012-07-21 MED ORDER — POTASSIUM CHLORIDE CRYS ER 10 MEQ PO TBCR: EXTENDED_RELEASE_TABLET | ORAL | Status: DC

## 2012-07-21 NOTE — Telephone Encounter (Signed)
Refill request

## 2012-07-21 NOTE — Telephone Encounter (Signed)
Patient needs refill called into [facility].  She would like to have more than 1 refill called in; so she does not have to call each month to get filled.

## 2012-07-21 NOTE — Telephone Encounter (Signed)
Pt would like a 3 month supply as this is cheaper

## 2012-07-22 MED ORDER — POTASSIUM CHLORIDE ER 10 MEQ PO TBCR: EXTENDED_RELEASE_TABLET | ORAL | Status: DC

## 2012-08-25 ENCOUNTER — Other Ambulatory Visit: Payer: Self-pay | Admitting: Internal Medicine

## 2012-08-25 ENCOUNTER — Telehealth: Payer: Self-pay | Admitting: *Deleted

## 2012-08-25 MED ORDER — LEVOTHYROXINE SODIUM 75 MCG PO TABS: 75.0000 ug | ORAL_TABLET | Freq: Every day | ORAL | Status: DC

## 2012-08-25 NOTE — Telephone Encounter (Signed)
Marcia Adams needs refill on her levothyroxine; she only has 2 pills left.

## 2012-08-26 ENCOUNTER — Telehealth: Payer: Self-pay | Admitting: *Deleted

## 2012-08-26 NOTE — Telephone Encounter (Signed)
Pt has an appt on 09/03/12 will come in on Monday before exam for labs

## 2012-09-01 ENCOUNTER — Other Ambulatory Visit: Payer: Self-pay | Admitting: *Deleted

## 2012-09-01 DIAGNOSIS — I10 Essential (primary) hypertension: Secondary | ICD-10-CM

## 2012-09-01 DIAGNOSIS — E785 Hyperlipidemia, unspecified: Secondary | ICD-10-CM

## 2012-09-01 DIAGNOSIS — B977 Papillomavirus as the cause of diseases classified elsewhere: Secondary | ICD-10-CM

## 2012-09-01 DIAGNOSIS — R5383 Other fatigue: Secondary | ICD-10-CM

## 2012-09-01 DIAGNOSIS — D649 Anemia, unspecified: Secondary | ICD-10-CM

## 2012-09-01 DIAGNOSIS — E039 Hypothyroidism, unspecified: Secondary | ICD-10-CM

## 2012-09-01 LAB — CBC WITH DIFFERENTIAL/PLATELET
Eosinophils Absolute: 0.2 10*3/uL (ref 0.0–0.7)
Eosinophils Relative: 3 % (ref 0–5)
HCT: 42.2 % (ref 36.0–46.0)
Lymphs Abs: 2.2 10*3/uL (ref 0.7–4.0)
MCH: 31.6 pg (ref 26.0–34.0)
MCV: 90.8 fL (ref 78.0–100.0)
Monocytes Absolute: 0.5 10*3/uL (ref 0.1–1.0)
Monocytes Relative: 9 % (ref 3–12)
Platelets: 344 10*3/uL (ref 150–400)
RBC: 4.65 MIL/uL (ref 3.87–5.11)

## 2012-09-01 LAB — TSH: TSH: 0.836 u[IU]/mL (ref 0.350–4.500)

## 2012-09-01 LAB — COMPREHENSIVE METABOLIC PANEL
Alkaline Phosphatase: 83 U/L (ref 39–117)
Glucose, Bld: 92 mg/dL (ref 70–99)
Sodium: 140 mEq/L (ref 135–145)
Total Bilirubin: 0.7 mg/dL (ref 0.3–1.2)
Total Protein: 6.8 g/dL (ref 6.0–8.3)

## 2012-09-01 LAB — LIPID PANEL
LDL Cholesterol: 118 mg/dL — ABNORMAL HIGH (ref 0–99)
Total CHOL/HDL Ratio: 3.7 Ratio
VLDL: 28 mg/dL (ref 0–40)

## 2012-09-03 ENCOUNTER — Ambulatory Visit (HOSPITAL_BASED_OUTPATIENT_CLINIC_OR_DEPARTMENT_OTHER)
Admission: RE | Admit: 2012-09-03 | Discharge: 2012-09-03 | Disposition: A | Discharge status: Home / Self Care | Payer: BC Managed Care – PPO | Source: Ambulatory Visit | Attending: Internal Medicine | Admitting: Internal Medicine

## 2012-09-03 ENCOUNTER — Encounter: Payer: Self-pay | Admitting: Internal Medicine

## 2012-09-03 ENCOUNTER — Ambulatory Visit (INDEPENDENT_AMBULATORY_CARE_PROVIDER_SITE_OTHER): Payer: BC Managed Care – PPO | Admitting: Internal Medicine

## 2012-09-03 VITALS — BP 122/81 | HR 97 | Temp 97.1°F | Resp 18 | Wt 163.0 lb

## 2012-09-03 DIAGNOSIS — E039 Hypothyroidism, unspecified: Secondary | ICD-10-CM

## 2012-09-03 DIAGNOSIS — Z0000 Encounter for general adult medical examination without abnormal findings: Secondary | ICD-10-CM

## 2012-09-03 DIAGNOSIS — E785 Hyperlipidemia, unspecified: Secondary | ICD-10-CM

## 2012-09-03 DIAGNOSIS — Z139 Encounter for screening, unspecified: Secondary | ICD-10-CM

## 2012-09-03 DIAGNOSIS — Z124 Encounter for screening for malignant neoplasm of cervix: Secondary | ICD-10-CM

## 2012-09-03 DIAGNOSIS — F3289 Other specified depressive episodes: Secondary | ICD-10-CM

## 2012-09-03 DIAGNOSIS — Z1231 Encounter for screening mammogram for malignant neoplasm of breast: Secondary | ICD-10-CM | POA: Insufficient documentation

## 2012-09-03 DIAGNOSIS — F329 Major depressive disorder, single episode, unspecified: Secondary | ICD-10-CM

## 2012-09-03 DIAGNOSIS — Z1151 Encounter for screening for human papillomavirus (HPV): Secondary | ICD-10-CM

## 2012-09-03 DIAGNOSIS — I10 Essential (primary) hypertension: Secondary | ICD-10-CM

## 2012-09-03 MED ORDER — ACYCLOVIR 400 MG PO TABS: 400.0000 mg | ORAL_TABLET | Freq: Every day | ORAL | Status: DC

## 2012-09-03 MED ORDER — LEVOTHYROXINE SODIUM 75 MCG PO TABS: 75.0000 ug | ORAL_TABLET | Freq: Every day | ORAL | Status: DC

## 2012-09-03 MED ORDER — SIMVASTATIN 10 MG PO TABS: 10.0000 mg | ORAL_TABLET | Freq: Every day | ORAL | Status: DC

## 2012-09-03 MED ORDER — TRIAMTERENE-HCTZ 37.5-25 MG PO TABS: 1.0000 | ORAL_TABLET | Freq: Every day | ORAL | Status: DC

## 2012-09-03 NOTE — Patient Instructions (Addendum)
See me in 3 months

## 2012-09-03 NOTE — Progress Notes (Signed)
Subjective:    Patient ID: Marcia Adams, female    DOB: 1955-05-18, 57 y.o.   MRN: [id number]  HPI Marcia Adams is here for CPE  She has lost several pounds  When she changed her antidepressant and is very happy about this.    She reports she has a long history of being HPV positive initially evaluated by Dr. Chevis Adams but she is not sure what procedures if any that she had at that time.  Her former husband had genital warts.   See recent labs her calcium is slightly elevated .  She takes 1000 mg of calcium daily with 2000 units of vitamin D  She would like to try a K sparing diuretic as she is about to retire and would like to simplify her medications   Allergies  Allergen Reactions  . Aripiprazole Other (See Comments)    Excessive weight gain  . Escitalopram Oxalate   . Shrimp (Shellfish Allergy) Other (See Comments)    On allergy testing only  . Enalapril Rash   Past Medical History  Diagnosis Date  . Depression   . Hypertension   . GERD (gastroesophageal reflux disease)   . Hyperlipidemia   . Menopause   . Thyroid disease   . Insomnia   . Anemia   . Anxiety   . Vitamin D deficiency   . Hiatal hernia   . Esophageal stricture    Past Surgical History  Procedure Laterality Date  . Nasal sinus surgery  7/99  . Nissen fundoplication  7/07  . Hemorrhoid surgery    . Mass excision  06/08/2011    Procedure: MINOR EXCISION OF MASS;  Surgeon: Marcia Adams., MD;  Location: [facility];  Service: Orthopedics;  Laterality: Right;  excisional biopsy dorsal right hand   History   Social History  . Marital Status: Divorced    Spouse Name: N/A    Number of Children: N/A  . Years of Education: N/A   Occupational History  . Not on file.   Social History Main Topics  . Smoking status: Never Smoker   . Smokeless tobacco: Never Used  . Alcohol Use: Yes     Comment: occasionally  . Drug Use: No  . Sexually Active: Yes   Other Topics Concern  . Not on file    Social History Narrative  . No narrative on file   Family History  Problem Relation Age of Onset  . COPD Mother   . COPD Father   . Urolithiasis Brother   . Arthritis Maternal Grandmother   . Depression Brother   . Kidney cancer Maternal Grandfather    Patient Active Problem List   Diagnosis Date Noted  . 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 Postmenopausal atrophic vaginitis 05/26/2011  . Herpes 04/24/2011  . Anemia   . Anxiety   . Acne vulgaris 04/21/2008  . Keloid 10/22/2007  . Hypothyroidism 04/23/2007  . Carpal tunnel syndrome 10/22/2006  . Benign neoplasm of skin 02/27/2006  . Pain, neck 02/27/2006  . Paresthesia 02/27/2006  . Reflux esophagitis 02/20/2005  . Obesity 10/04/2004  . Heat exhaustion 10/04/2004  . Depression 11/10/2003  . Allergic rhinitis, cause unspecified 11/10/2003  . Calculus of ureter 11/10/2003  . Insomnia 11/10/2003  . Disorder of bone and cartilage, unspecified 03/24/2003  . Plantar fasciitis 03/04/2002  . Hyperlipidemia 01/14/2002  . Fatigue 12/03/2001  . Pain in the chest 12/03/2001  . Pain, abdominal, epigastric 12/03/2001  . UTI (urinary tract infection) 03/25/2000  .  Hemorrhoids, external 03/02/1999  . HTN (hypertension) 01/24/1999  . Allergic rhinitis due to dust 01/24/1999   Current Outpatient Prescriptions on File Prior to Visit  Medication Sig Dispense Refill  . buPROPion (WELLBUTRIN XL) 150 MG 24 hr tablet Take 300 mg by mouth daily.       . calcium-vitamin D (OSCAL WITH D) 500-200 MG-UNIT per tablet Take 1 tablet by mouth daily.        Marland Kitchen DYMISTA 137-50 MCG/ACT SUSP Place 1 spray into the nose Twice daily. Each nostril      . Eszopiclone 3 MG TABS Take 3 mg by mouth at bedtime. Take immediately before bedtime      . loratadine (CLARITIN) 10 MG tablet Take one tablet once daily as needed for allergies.       Marland Kitchen LORazepam (ATIVAN) 1 MG tablet 1 mg. Take two tablets nightly for sleep      . tazarotene (AVAGE) 0.1 % cream Apply once  nightly for acne       . EPIPEN 2-PAK 0.3 MG/0.3ML DEVI       . Probiotic Product (PROBIOTIC FORMULA PO) Take 1 capsule by mouth daily.      . [DISCONTINUED] azelastine (ASTELIN) 137 MCG/SPRAY nasal spray One spray in each nostril twice daily as needed for allergies.       . [DISCONTINUED] cloNIDine (CATAPRES) 0.1 MG tablet Take one tablet up to three times daily as needed to reduce excessive sweating.       . [DISCONTINUED] mirtazapine (REMERON) 15 MG tablet Take one tablet once daily at bedtime       . [DISCONTINUED] spironolactone (ALDACTONE) 50 MG tablet Take one tablet twice daily for acne.        No current facility-administered medications on file prior to visit.    ba   Review of Systems See HPI    Objective:   Physical Exam Physical Exam  Vital signs and nursing note reviewed  Constitutional: She is oriented to person, place, and time. She appears well-developed and well-nourished. She is cooperative.  HENT:  Head: Normocephalic and atraumatic.  Right Ear: Tympanic membrane normal.  Left Ear: Tympanic membrane normal.  Nose: Nose normal.  Mouth/Throat: Oropharynx is clear and moist and mucous membranes are normal. No oropharyngeal exudate or posterior oropharyngeal erythema.  Eyes: Conjunctivae and EOM are normal. Pupils are equal, round, and reactive to light.  Neck: Neck supple. No JVD present. Carotid bruit is not present. No mass and no thyromegaly present.  Cardiovascular: Regular rhythm, normal heart sounds, intact distal pulses and normal pulses.  Exam reveals no gallop and no friction rub.   No murmur heard. Pulses:      Dorsalis pedis pulses are 2+ on the right side, and 2+ on the left side.  Pulmonary/Chest: Breath sounds normal. She has no wheezes. She has no rhonchi. She has no rales. Right breast exhibits no mass, no nipple discharge and no skin change. Left breast exhibits no mass, no nipple discharge and no skin change.  Abdominal: Soft. Bowel sounds are  normal. She exhibits no distension and no mass. There is no hepatosplenomegaly. There is no tenderness. There is no CVA tenderness.  Genitourinary: Rectum normal, vagina normal and uterus normal. Rectal exam shows no mass. Guaiac negative stool. No labial fusion. There is no lesion on the right labia. There is no lesion on the left labia. Cervix exhibits no motion tenderness. Right adnexum displays no mass, no tenderness and no fullness. Left adnexum displays no mass,  no tenderness and no fullness. No erythema around the vagina.  Musculoskeletal:       No active synovitis to any joint.    Lymphadenopathy:       Right cervical: No superficial cervical adenopathy present.      Left cervical: No superficial cervical adenopathy present.       Right axillary: No pectoral and no lateral adenopathy present.       Left axillary: No pectoral and no lateral adenopathy present.      Right: No inguinal adenopathy present.       Left: No inguinal adenopathy present.  Neurological: She is alert and oriented to person, place, and time. She has normal strength and normal reflexes. No cranial nerve deficit or sensory deficit. She displays a negative Romberg sign. Coordination and gait normal.  Skin: Skin is warm and dry. No abrasion, no bruising, no ecchymosis and no rash noted. No cyanosis. Nails show no clubbing.  Psychiatric: She has a normal mood and affect. Her speech is normal and behavior is normal.          Assessment & Plan:  Health Maintenance:  Pap today and with long history of HPV positive will refer to GYN for coloposcopy.  MM today   HTN  Continue meds  Will stop HCTZ and K-dur and change to maxzide  See me in 3 months.  Hypothyrodism  TSH normal   Continue synthroid   Hypercalcemia  Will recheck today.  ADvised to reduce her supplemental calcium to 500 mg daily  HPV pos  See above  Depression  Managed Dr. Evelene Croon  Obesity  Weight loss with change in meds  See me in 3 months        Assessment & Plan:

## 2012-09-03 NOTE — Addendum Note (Signed)
Addended by: Mathews Robinsons on: 09/03/2012 04:30 PM   Modules accepted: Orders

## 2012-09-04 ENCOUNTER — Other Ambulatory Visit: Payer: Self-pay | Admitting: Internal Medicine

## 2012-09-04 ENCOUNTER — Other Ambulatory Visit: Payer: Self-pay | Admitting: *Deleted

## 2012-09-04 ENCOUNTER — Telehealth: Payer: Self-pay | Admitting: *Deleted

## 2012-09-04 DIAGNOSIS — IMO0002 Reserved for concepts with insufficient information to code with codable children: Secondary | ICD-10-CM

## 2012-09-04 LAB — HEPATITIS PANEL, ACUTE
HCV Ab: NEGATIVE
Hep A IgM: NEGATIVE
Hep B C IgM: NEGATIVE
Hepatitis B Surface Ag: NEGATIVE

## 2012-09-04 NOTE — Telephone Encounter (Signed)
Message copied by Mathews Robinsons on Thu Sep 04, 2012  3:29 PM ------      Message from: Raechel Chute D      Created: Thu Sep 04, 2012 10:35 AM       Karen Kitchens            Add a serum intact PTH to her labs ------

## 2012-09-04 NOTE — Telephone Encounter (Signed)
Pt will come in to have more blood work.drawn on Monday8/4

## 2012-09-05 HISTORY — PX: OTHER SURGICAL HISTORY: SHX169

## 2012-09-17 ENCOUNTER — Telehealth: Payer: Self-pay | Admitting: Internal Medicine

## 2012-09-17 ENCOUNTER — Encounter: Payer: Self-pay | Admitting: Internal Medicine

## 2012-09-17 NOTE — Telephone Encounter (Signed)
Spoke with pt and informed of calcium and pap results.    She had to have emergent surgery for a detached retina.  She will reschedule her appt with Dr. Langston Masker GYN and she will come to [facility] office to have PTH level done

## 2012-09-24 ENCOUNTER — Encounter (HOSPITAL_BASED_OUTPATIENT_CLINIC_OR_DEPARTMENT_OTHER): Payer: Self-pay | Admitting: Emergency Medicine

## 2012-09-24 ENCOUNTER — Emergency Department (HOSPITAL_BASED_OUTPATIENT_CLINIC_OR_DEPARTMENT_OTHER): Payer: BC Managed Care – PPO

## 2012-09-24 ENCOUNTER — Inpatient Hospital Stay (HOSPITAL_BASED_OUTPATIENT_CLINIC_OR_DEPARTMENT_OTHER)
Admission: EM | Admit: 2012-09-24 | Discharge: 2012-09-26 | DRG: 551 | Disposition: A | Discharge status: Home / Self Care | Payer: BC Managed Care – PPO | Attending: Internal Medicine | Admitting: Internal Medicine

## 2012-09-24 DIAGNOSIS — B009 Herpesviral infection, unspecified: Secondary | ICD-10-CM

## 2012-09-24 DIAGNOSIS — R202 Paresthesia of skin: Secondary | ICD-10-CM

## 2012-09-24 DIAGNOSIS — K5732 Diverticulitis of large intestine without perforation or abscess without bleeding: Principal | ICD-10-CM | POA: Diagnosis present

## 2012-09-24 DIAGNOSIS — K529 Noninfective gastroenteritis and colitis, unspecified: Secondary | ICD-10-CM

## 2012-09-24 DIAGNOSIS — F329 Major depressive disorder, single episode, unspecified: Secondary | ICD-10-CM

## 2012-09-24 DIAGNOSIS — M542 Cervicalgia: Secondary | ICD-10-CM

## 2012-09-24 DIAGNOSIS — I10 Essential (primary) hypertension: Secondary | ICD-10-CM

## 2012-09-24 DIAGNOSIS — Z780 Asymptomatic menopausal state: Secondary | ICD-10-CM

## 2012-09-24 DIAGNOSIS — G4700 Insomnia, unspecified: Secondary | ICD-10-CM

## 2012-09-24 DIAGNOSIS — K644 Residual hemorrhoidal skin tags: Secondary | ICD-10-CM

## 2012-09-24 DIAGNOSIS — B977 Papillomavirus as the cause of diseases classified elsewhere: Secondary | ICD-10-CM

## 2012-09-24 DIAGNOSIS — J3089 Other allergic rhinitis: Secondary | ICD-10-CM

## 2012-09-24 DIAGNOSIS — F32A Depression, unspecified: Secondary | ICD-10-CM

## 2012-09-24 DIAGNOSIS — N201 Calculus of ureter: Secondary | ICD-10-CM

## 2012-09-24 DIAGNOSIS — Z888 Allergy status to other drugs, medicaments and biological substances status: Secondary | ICD-10-CM

## 2012-09-24 DIAGNOSIS — F419 Anxiety disorder, unspecified: Secondary | ICD-10-CM

## 2012-09-24 DIAGNOSIS — E876 Hypokalemia: Secondary | ICD-10-CM | POA: Diagnosis present

## 2012-09-24 DIAGNOSIS — K5289 Other specified noninfective gastroenteritis and colitis: Secondary | ICD-10-CM | POA: Diagnosis present

## 2012-09-24 DIAGNOSIS — D649 Anemia, unspecified: Secondary | ICD-10-CM

## 2012-09-24 DIAGNOSIS — E039 Hypothyroidism, unspecified: Secondary | ICD-10-CM

## 2012-09-24 DIAGNOSIS — R079 Chest pain, unspecified: Secondary | ICD-10-CM

## 2012-09-24 DIAGNOSIS — M722 Plantar fascial fibromatosis: Secondary | ICD-10-CM

## 2012-09-24 DIAGNOSIS — F3289 Other specified depressive episodes: Secondary | ICD-10-CM | POA: Diagnosis present

## 2012-09-24 DIAGNOSIS — Z91013 Allergy to seafood: Secondary | ICD-10-CM

## 2012-09-24 DIAGNOSIS — K5792 Diverticulitis of intestine, part unspecified, without perforation or abscess without bleeding: Secondary | ICD-10-CM

## 2012-09-24 DIAGNOSIS — N390 Urinary tract infection, site not specified: Secondary | ICD-10-CM

## 2012-09-24 DIAGNOSIS — E785 Hyperlipidemia, unspecified: Secondary | ICD-10-CM

## 2012-09-24 DIAGNOSIS — E669 Obesity, unspecified: Secondary | ICD-10-CM

## 2012-09-24 DIAGNOSIS — R1013 Epigastric pain: Secondary | ICD-10-CM

## 2012-09-24 DIAGNOSIS — R112 Nausea with vomiting, unspecified: Secondary | ICD-10-CM

## 2012-09-24 DIAGNOSIS — K2100 Gastro-esophageal reflux disease with esophagitis, without bleeding: Secondary | ICD-10-CM

## 2012-09-24 DIAGNOSIS — N952 Postmenopausal atrophic vaginitis: Secondary | ICD-10-CM

## 2012-09-24 DIAGNOSIS — J309 Allergic rhinitis, unspecified: Secondary | ICD-10-CM

## 2012-09-24 DIAGNOSIS — K219 Gastro-esophageal reflux disease without esophagitis: Secondary | ICD-10-CM | POA: Diagnosis present

## 2012-09-24 DIAGNOSIS — R5383 Other fatigue: Secondary | ICD-10-CM

## 2012-09-24 DIAGNOSIS — R109 Unspecified abdominal pain: Secondary | ICD-10-CM

## 2012-09-24 DIAGNOSIS — L910 Hypertrophic scar: Secondary | ICD-10-CM

## 2012-09-24 DIAGNOSIS — L700 Acne vulgaris: Secondary | ICD-10-CM

## 2012-09-24 DIAGNOSIS — N179 Acute kidney failure, unspecified: Secondary | ICD-10-CM

## 2012-09-24 DIAGNOSIS — M899 Disorder of bone, unspecified: Secondary | ICD-10-CM

## 2012-09-24 LAB — COMPREHENSIVE METABOLIC PANEL
ALT: 32 U/L (ref 0–35)
AST: 40 U/L — ABNORMAL HIGH (ref 0–37)
Alkaline Phosphatase: 83 U/L (ref 39–117)
CO2: 27 mEq/L (ref 19–32)
Chloride: 94 mEq/L — ABNORMAL LOW (ref 96–112)
Creatinine, Ser: 1.3 mg/dL — ABNORMAL HIGH (ref 0.50–1.10)
GFR calc non Af Amer: 44 mL/min — ABNORMAL LOW (ref >90)
Potassium: 3.6 mEq/L (ref 3.5–5.1)
Total Bilirubin: 0.8 mg/dL (ref 0.3–1.2)

## 2012-09-24 LAB — CBC
MCH: 32.4 pg (ref 26.0–34.0)
MCHC: 35.5 g/dL (ref 30.0–36.0)
MCV: 91.4 fL (ref 78.0–100.0)
Platelets: 317 10*3/uL (ref 150–400)
RBC: 4.87 MIL/uL (ref 3.87–5.11)
RDW: 11.9 % (ref 11.5–15.5)

## 2012-09-24 LAB — URINALYSIS, ROUTINE W REFLEX MICROSCOPIC
Bilirubin Urine: NEGATIVE
Ketones, ur: NEGATIVE mg/dL
Leukocytes, UA: NEGATIVE
Nitrite: NEGATIVE
Protein, ur: NEGATIVE mg/dL
Urobilinogen, UA: 1 mg/dL (ref 0.0–1.0)

## 2012-09-24 MED ORDER — CIPROFLOXACIN IN D5W 400 MG/200ML IV SOLN
400.0000 mg | Freq: Once | INTRAVENOUS | Status: AC
  Administered 2012-09-24: 400 mg via INTRAVENOUS
  Filled 2012-09-24: qty 200

## 2012-09-24 MED ORDER — METRONIDAZOLE IN NACL 5-0.79 MG/ML-% IV SOLN
500.0000 mg | Freq: Once | INTRAVENOUS | Status: AC
  Administered 2012-09-24: 500 mg via INTRAVENOUS
  Filled 2012-09-24: qty 100

## 2012-09-24 MED ORDER — HYDROMORPHONE HCL PF 1 MG/ML IJ SOLN
1.0000 mg | Freq: Once | INTRAMUSCULAR | Status: AC
  Administered 2012-09-24: 1 mg via INTRAVENOUS
  Filled 2012-09-24: qty 1

## 2012-09-24 MED ORDER — SODIUM CHLORIDE 0.9 % IV BOLUS (SEPSIS)
1000.0000 mL | Freq: Once | INTRAVENOUS | Status: AC
  Administered 2012-09-24: 1000 mL via INTRAVENOUS

## 2012-09-24 MED ORDER — ONDANSETRON HCL 4 MG/2ML IJ SOLN
4.0000 mg | Freq: Once | INTRAMUSCULAR | Status: AC
  Administered 2012-09-24: 4 mg via INTRAVENOUS
  Filled 2012-09-24: qty 2

## 2012-09-24 MED ORDER — IOHEXOL 300 MG/ML  SOLN
100.0000 mL | Freq: Once | INTRAMUSCULAR | Status: AC | PRN
  Administered 2012-09-24: 80 mL via INTRAVENOUS

## 2012-09-24 MED ORDER — IOHEXOL 300 MG/ML  SOLN
50.0000 mL | Freq: Once | INTRAMUSCULAR | Status: AC | PRN
  Administered 2012-09-24: 50 mL via ORAL

## 2012-09-24 NOTE — ED Notes (Signed)
Pt states abdominal pain with nausea and vomiting started on 09/23/12 at 10pm.  Incontinent bowel and bladder.

## 2012-09-24 NOTE — ED Provider Notes (Addendum)
CSN: [id number]     Arrival date & time 09/24/12  1704 History     First MD Initiated Contact with Patient 09/24/12 1734     Chief Complaint  Patient presents with  . Abdominal Pain   (Consider location/radiation/quality/duration/timing/severity/associated sxs/prior Treatment) HPI 57 y.o. Female with vomiting, diarrhea began last night about 10 p.m.  Vomiting and stooling at least every hour.  Some chills but no fever.  Diffuse abdominal pain began after initial vomiting and diarrhea.  Light headed with syncope x 2 today after vomiting.  Patient unable to keep fluids down.  NO meds given.   Past Medical History  Diagnosis Date  . Depression   . Hypertension   . GERD (gastroesophageal reflux disease)   . Hyperlipidemia   . Menopause   . Thyroid disease   . Insomnia   . Anemia   . Anxiety   . Vitamin D deficiency   . Hiatal hernia   . Esophageal stricture    Past Surgical History  Procedure Laterality Date  . Nasal sinus surgery  7/99  . Nissen fundoplication  7/07  . Hemorrhoid surgery    . Mass excision  06/08/2011    Procedure: MINOR EXCISION OF MASS;  Surgeon: Wyn Forster., MD;  Location: [facility];  Service: Orthopedics;  Laterality: Right;  excisional biopsy dorsal right hand  . Retinal detachment surgery      Gas bubble in center of right eye.  Pt not to lye completely flat.   Family History  Problem Relation Age of Onset  . COPD Mother   . COPD Father   . Urolithiasis Brother   . Arthritis Maternal Grandmother   . Depression Brother   . Kidney cancer Maternal Grandfather    History  Substance Use Topics  . Smoking status: Never Smoker   . Smokeless tobacco: Never Used  . Alcohol Use: No     Comment: occasionally   OB History   Grav Para Term Preterm Abortions TAB SAB Ect Mult Living   2 2             Review of Systems  All other systems reviewed and are negative.    Allergies  Aripiprazole; Escitalopram oxalate; Shrimp; and  Enalapril  Home Medications   Current Outpatient Rx  Name  Route  Sig  Dispense  Refill  . Vortioxetine HBr (BRINTELLIX) 5 MG TABS   Oral   Take by mouth.         Marland Kitchen acyclovir (ZOVIRAX) 400 MG tablet   Oral   Take 1 tablet (400 mg total) by mouth daily. Take one tablet once daily to prevent infection.   90 tablet   3   . buPROPion (WELLBUTRIN XL) 150 MG 24 hr tablet   Oral   Take 300 mg by mouth daily.          . calcium-vitamin D (OSCAL WITH D) 500-200 MG-UNIT per tablet   Oral   Take 1 tablet by mouth daily.           Marland Kitchen DYMISTA 137-50 MCG/ACT SUSP   Nasal   Place 1 spray into the nose Twice daily. Each nostril         . EPIPEN 2-PAK 0.3 MG/0.3ML DEVI               . Eszopiclone 3 MG TABS   Oral   Take 3 mg by mouth at bedtime. Take immediately before bedtime         .  levothyroxine (SYNTHROID, LEVOTHROID) 75 MCG tablet   Oral   Take 1 tablet (75 mcg total) by mouth daily. Take one tablet once daily for thyroid supplement.   90 tablet   3   . loratadine (CLARITIN) 10 MG tablet      Take one tablet once daily as needed for allergies.          Marland Kitchen LORazepam (ATIVAN) 1 MG tablet      1 mg. Take two tablets nightly for sleep         . Probiotic Product (PROBIOTIC FORMULA PO)   Oral   Take 1 capsule by mouth daily.         . simvastatin (ZOCOR) 10 MG tablet   Oral   Take 1 tablet (10 mg total) by mouth at bedtime.   90 tablet   1   . tazarotene (AVAGE) 0.1 % cream      Apply once nightly for acne          . triamterene-hydrochlorothiazide (MAXZIDE-25) 37.5-25 MG per tablet   Oral   Take 1 tablet by mouth daily.   90 tablet   1    BP 159/101  Pulse 97  Temp(Src) 98.8 F (37.1 C) (Oral)  Ht 5\' 4"  (1.626 m)  Wt 161 lb (73.029 kg)  BMI 27.62 kg/m2  SpO2 98% Physical Exam  Nursing note and vitals reviewed. Constitutional: She is oriented to person, place, and time. She appears well-developed and well-nourished.  HENT:  Head:  Normocephalic and atraumatic.  Right Ear: External ear normal.  Left Ear: External ear normal.  Nose: Nose normal.  Mouth/Throat: Oropharynx is clear and moist.  Eyes: Conjunctivae and EOM are normal. Pupils are equal, round, and reactive to light.  Neck: Normal range of motion. Neck supple.  Cardiovascular: Normal rate, regular rhythm, normal heart sounds and intact distal pulses.   Pulmonary/Chest: Effort normal and breath sounds normal.  Abdominal: Soft. Bowel sounds are normal. There is tenderness.  Bilateral lower abdominal tenderness to palpation abdomen is not distended there is no guarding or rebound noted.  Musculoskeletal: Normal range of motion.  Neurological: She is alert and oriented to person, place, and time. She has normal reflexes.  Skin: Skin is warm and dry.  Psychiatric: She has a normal mood and affect. Her behavior is normal. Thought content normal.    ED Course   Procedures (including critical care time)  Labs Reviewed - No data to display No results found. No diagnosis found.  MDM   Results for orders placed during the hospital encounter of 09/24/12  COMPREHENSIVE METABOLIC PANEL      Result Value Range   Sodium 135  135 - 145 mEq/L   Potassium 3.6  3.5 - 5.1 mEq/L   Chloride 94 (*) 96 - 112 mEq/L   CO2 27  19 - 32 mEq/L   Glucose, Bld 125 (*) 70 - 99 mg/dL   BUN 34 (*) 6 - 23 mg/dL   Creatinine, Ser 4.54 (*) 0.50 - 1.10 mg/dL   Calcium 09.8 (*) 8.4 - 10.5 mg/dL   Total Protein 6.8  6.0 - 8.3 g/dL   Albumin 3.6  3.5 - 5.2 g/dL   AST 40 (*) 0 - 37 U/L   ALT 32  0 - 35 U/L   Alkaline Phosphatase 83  39 - 117 U/L   Total Bilirubin 0.8  0.3 - 1.2 mg/dL   GFR calc non Af Amer 44 (*) >90 mL/min   GFR  calc Af Amer 51 (*) >90 mL/min  CBC      Result Value Range   WBC 16.5 (*) 4.0 - 10.5 K/uL   RBC 4.87  3.87 - 5.11 MIL/uL   Hemoglobin 15.8 (*) 12.0 - 15.0 g/dL   HCT 10.9  60.4 - 54.0 %   MCV 91.4  78.0 - 100.0 fL   MCH 32.4  26.0 - 34.0 pg   MCHC  35.5  30.0 - 36.0 g/dL   RDW 98.1  19.1 - 47.8 %   Platelets 317  150 - 400 K/uL   No information on file. Ct Abdomen Pelvis W Contrast  09/24/2012   *RADIOLOGY REPORT*  Clinical Data: Middle abdominal pain.  Nausea, vomiting and diarrhea.  Urinary incontinence for 1 day.  CT ABDOMEN AND PELVIS WITH CONTRAST  Technique:  Multidetector CT imaging of the abdomen and pelvis was performed following the standard protocol during bolus administration of intravenous contrast.  Contrast: 50mL OMNIPAQUE IOHEXOL 300 MG/ML  SOLN, 80mL OMNIPAQUE IOHEXOL 300 MG/ML  SOLN  Comparison: 02/01/2006  Findings: There is wall thickening of the descending colon and proximal sigmoid colon with associated pericolonic fat inflammation.  There are numerous sigmoid colon diverticula with other diverticula scattered elsewhere throughout the colon.  Given the relatively long segment of bowel involved with wall thickening and adjacent inflammation, diverticulitis is felt less likely. This is more likely a diffuse infectious or inflammatory colitis. The transverse right colon showed diverticula but no inflammatory changes.  A normal the appendix is not visualized, but there are no findings of appendicitis.  The small bowel is unremarkable.  The stomach is moderately distended and there is a moderate sized hiatal hernia with the surgical vascular clips at the esophageal hiatus and may be from previous hiatal hernia surgery or from the Nissen fundoplication.  There are minor reticular opacities at the lung bases consistent with subsegmental atelectasis.  The liver, spleen and pancreas normal appearing gallbladder is distended but otherwise unremarkable.  There is no bile duct dilation.  There are no adrenal masses.  There is a mid pole right renal cyst.  The kidneys are otherwise unremarkable.  Normal ureters and bladder.  The uterus and adnexa are unremarkable.  No adenopathy is seen.  There is a trace amount of ascites along the right  lateral margin of the liver and collecting in the posterior pelvic recess.  No osteoblastic or osteolytic lesions.  IMPRESSION: Wall thickening and adjacent fat inflammation is seen along the entire descending colon in the proximal sigmoid colon most likely due to an infectious or inflammatory colitis.  There are numerous diverticula.  Diverticulitis is not excluded but felt less likely given the extent of colonic involvement.  Trace amount of ascites.  Moderate gastric distention which may be a mild ileus related to the colonic inflammation.  Small to moderate hiatal hernia.  No other acute findings.   Original Report Authenticated By: Amie Portland, M.D.   Mm Digital Screening Patient with some subjective improvement here. She is given Cipro and Flagyl IV. Her abdomen continues to be moderately tender to palpation. She appears somewhat dehydrated with increased BUN and creatinine. Her white blood cell count is elevated at 16,500. CT shows diffuse wall thickening and inflammation along the entire descending colon in the proximal sigmoid colon. This is possibly secondary to diverticulitis the patient does not have any history of ulcerative colitis or autoimmune colitis. Since care is discussed with Dr. Toniann Fail and she will be placed in a  MedSurg bed at [facility] on team 10.  Hilario Quarry, MD 09/24/12 2029  Hilario Quarry, MD 09/24/12 2029

## 2012-09-24 NOTE — ED Notes (Signed)
Attempt to call report to  6 Chiropractor , nurse not available  For report

## 2012-09-25 ENCOUNTER — Encounter (HOSPITAL_COMMUNITY): Payer: Self-pay | Admitting: Internal Medicine

## 2012-09-25 DIAGNOSIS — N179 Acute kidney failure, unspecified: Secondary | ICD-10-CM | POA: Diagnosis present

## 2012-09-25 DIAGNOSIS — R197 Diarrhea, unspecified: Secondary | ICD-10-CM

## 2012-09-25 DIAGNOSIS — R112 Nausea with vomiting, unspecified: Secondary | ICD-10-CM | POA: Diagnosis present

## 2012-09-25 DIAGNOSIS — K5792 Diverticulitis of intestine, part unspecified, without perforation or abscess without bleeding: Secondary | ICD-10-CM | POA: Diagnosis present

## 2012-09-25 DIAGNOSIS — R109 Unspecified abdominal pain: Secondary | ICD-10-CM | POA: Diagnosis present

## 2012-09-25 DIAGNOSIS — K5289 Other specified noninfective gastroenteritis and colitis: Secondary | ICD-10-CM

## 2012-09-25 LAB — COMPREHENSIVE METABOLIC PANEL
ALT: 20 U/L (ref 0–35)
AST: 25 U/L (ref 0–37)
Calcium: 8.6 mg/dL (ref 8.4–10.5)
Sodium: 133 mEq/L — ABNORMAL LOW (ref 135–145)
Total Protein: 5.6 g/dL — ABNORMAL LOW (ref 6.0–8.3)

## 2012-09-25 LAB — CBC WITH DIFFERENTIAL/PLATELET
Basophils Relative: 0 % (ref 0–1)
Eosinophils Absolute: 0.1 10*3/uL (ref 0.0–0.7)
HCT: 36.4 % (ref 36.0–46.0)
Hemoglobin: 12.5 g/dL (ref 12.0–15.0)
MCH: 31.9 pg (ref 26.0–34.0)
MCHC: 34.3 g/dL (ref 30.0–36.0)
Monocytes Absolute: 1.4 10*3/uL — ABNORMAL HIGH (ref 0.1–1.0)
Monocytes Relative: 12 % (ref 3–12)
Neutro Abs: 7.9 10*3/uL — ABNORMAL HIGH (ref 1.7–7.7)

## 2012-09-25 LAB — POTASSIUM: Potassium: 4.4 mEq/L (ref 3.5–5.1)

## 2012-09-25 MED ORDER — ONDANSETRON HCL 4 MG/2ML IJ SOLN: 4.0000 mg | Freq: Four times a day (QID) | INTRAMUSCULAR | Status: DC | PRN

## 2012-09-25 MED ORDER — ACETAMINOPHEN 650 MG RE SUPP: 650.0000 mg | Freq: Four times a day (QID) | RECTAL | Status: DC | PRN

## 2012-09-25 MED ORDER — TRIAMTERENE-HCTZ 37.5-25 MG PO TABS
1.0000 | ORAL_TABLET | Freq: Every day | ORAL | Status: DC
  Filled 2012-09-25: qty 1

## 2012-09-25 MED ORDER — SODIUM CHLORIDE 0.9 % IV SOLN
INTRAVENOUS | Status: AC
  Administered 2012-09-25 (×2): via INTRAVENOUS

## 2012-09-25 MED ORDER — HYDROMORPHONE HCL PF 1 MG/ML IJ SOLN
0.5000 mg | INTRAMUSCULAR | Status: DC | PRN
  Administered 2012-09-25 (×2): 0.5 mg via INTRAVENOUS
  Filled 2012-09-25: qty 1

## 2012-09-25 MED ORDER — ZOLPIDEM TARTRATE 5 MG PO TABS
5.0000 mg | ORAL_TABLET | Freq: Every evening | ORAL | Status: DC | PRN
  Administered 2012-09-25 (×2): 5 mg via ORAL
  Filled 2012-09-25 (×2): qty 1

## 2012-09-25 MED ORDER — LEVOTHYROXINE SODIUM 75 MCG PO TABS
75.0000 ug | ORAL_TABLET | Freq: Every day | ORAL | Status: DC
  Administered 2012-09-25 – 2012-09-26 (×2): 75 ug via ORAL
  Filled 2012-09-25 (×3): qty 1

## 2012-09-25 MED ORDER — POTASSIUM CHLORIDE CRYS ER 20 MEQ PO TBCR: 40.0000 meq | EXTENDED_RELEASE_TABLET | Freq: Two times a day (BID) | ORAL | Status: DC

## 2012-09-25 MED ORDER — POTASSIUM CHLORIDE 10 MEQ/100ML IV SOLN
10.0000 meq | INTRAVENOUS | Status: AC
  Administered 2012-09-25 (×3): 10 meq via INTRAVENOUS
  Filled 2012-09-25: qty 300

## 2012-09-25 MED ORDER — SIMVASTATIN 10 MG PO TABS
10.0000 mg | ORAL_TABLET | Freq: Every day | ORAL | Status: DC
  Administered 2012-09-25 (×2): 10 mg via ORAL
  Filled 2012-09-25 (×3): qty 1

## 2012-09-25 MED ORDER — HYDRALAZINE HCL 20 MG/ML IJ SOLN: 10.0000 mg | INTRAMUSCULAR | Status: DC | PRN

## 2012-09-25 MED ORDER — ACYCLOVIR 200 MG PO CAPS
400.0000 mg | ORAL_CAPSULE | Freq: Every day | ORAL | Status: DC
  Administered 2012-09-25: 400 mg via ORAL
  Filled 2012-09-25 (×2): qty 2

## 2012-09-25 MED ORDER — ACETAMINOPHEN 325 MG PO TABS: 650.0000 mg | ORAL_TABLET | Freq: Four times a day (QID) | ORAL | Status: DC | PRN

## 2012-09-25 MED ORDER — POTASSIUM CHLORIDE CRYS ER 20 MEQ PO TBCR
40.0000 meq | EXTENDED_RELEASE_TABLET | ORAL | Status: AC
  Administered 2012-09-25 (×2): 40 meq via ORAL
  Filled 2012-09-25 (×2): qty 1

## 2012-09-25 MED ORDER — ONDANSETRON HCL 4 MG PO TABS: 4.0000 mg | ORAL_TABLET | Freq: Four times a day (QID) | ORAL | Status: DC | PRN

## 2012-09-25 MED ORDER — VORTIOXETINE HBR 5 MG PO TABS
20.0000 mg | ORAL_TABLET | Freq: Every day | ORAL | Status: DC
  Filled 2012-09-25 (×2): qty 4

## 2012-09-25 MED ORDER — TAZAROTENE 0.1 % EX CREA: TOPICAL_CREAM | Freq: Every day | CUTANEOUS | Status: DC

## 2012-09-25 MED ORDER — BUPROPION HCL ER (XL) 300 MG PO TB24
450.0000 mg | ORAL_TABLET | Freq: Every day | ORAL | Status: DC
  Administered 2012-09-25 – 2012-09-26 (×2): 450 mg via ORAL
  Filled 2012-09-25 (×2): qty 1

## 2012-09-25 MED ORDER — LORAZEPAM 1 MG PO TABS
3.0000 mg | ORAL_TABLET | Freq: Every day | ORAL | Status: DC
  Administered 2012-09-25 (×2): 3 mg via ORAL
  Filled 2012-09-25 (×2): qty 3

## 2012-09-25 MED ORDER — CIPROFLOXACIN IN D5W 400 MG/200ML IV SOLN
400.0000 mg | Freq: Two times a day (BID) | INTRAVENOUS | Status: DC
  Administered 2012-09-25 – 2012-09-26 (×3): 400 mg via INTRAVENOUS
  Filled 2012-09-25 (×4): qty 200

## 2012-09-25 MED ORDER — METRONIDAZOLE IN NACL 5-0.79 MG/ML-% IV SOLN
500.0000 mg | Freq: Three times a day (TID) | INTRAVENOUS | Status: DC
  Administered 2012-09-25 – 2012-09-26 (×4): 500 mg via INTRAVENOUS
  Filled 2012-09-25 (×6): qty 100

## 2012-09-25 MED ORDER — LORATADINE 10 MG PO TABS
10.0000 mg | ORAL_TABLET | Freq: Every day | ORAL | Status: DC
  Administered 2012-09-25 – 2012-09-26 (×2): 10 mg via ORAL
  Filled 2012-09-25 (×2): qty 1

## 2012-09-25 NOTE — H&P (Signed)
[facility] History and Physical  [name]:[id number] DOB: 13-Oct-1955 DOA: 09/24/2012  Referring physician: ER physician. Transfer from [employer]. PCP: Levon Hedger, MD   Chief Complaint: Abdominal pain with nausea vomiting and diarrhea.  HPI: Marcia Adams is a 57 y.o. female with history of hyperthyroidism, hypertension depression and hyperlipidemia has been experiencing nausea vomiting and diarrhea lasting for hours. Patient also has been having lower abdominal pain. The pain is crampy in nature. Denies any fever chills. Since patient's pain was persistent with nausea vomiting and diarrhea patient presented to the ER and CT abdomen and pelvis were showing features concerning for colitis involving the descending and sigmoid colon. Patient denies having taken any recent antibiotics or been hospitalized. Patient otherwise denies any chest pain or shortness of breath. Pain is improved with pain relief medication. Patient has not had any colonoscopy before.  Review of Systems: As presented in the history of presenting illness, rest negative.  Past Medical History  Diagnosis Date  . Depression   . Hypertension   . GERD (gastroesophageal reflux disease)   . Hyperlipidemia   . Menopause   . Thyroid disease   . Insomnia   . Anemia   . Anxiety   . Vitamin D deficiency   . Hiatal hernia   . Esophageal stricture    Past Surgical History  Procedure Laterality Date  . Nasal sinus surgery  7/99  . Nissen fundoplication  7/07  . Hemorrhoid surgery    . Mass excision  06/08/2011    Procedure: MINOR EXCISION OF MASS;  Surgeon: Wyn Forster., MD;  Location: [facility];  Service: Orthopedics;  Laterality: Right;  excisional biopsy dorsal right hand  . Retinal detachment surgery      Gas bubble in center of right eye.  Pt not to lye completely flat.   Social History:  reports that she has never smoked. She has never used smokeless tobacco.  She reports that she does not drink alcohol or use illicit drugs. Home. where does patient live-- Can do ADLs. Can patient participate in ADLs?  Allergies  Allergen Reactions  . Aripiprazole Other (See Comments)    Excessive weight gain  . Escitalopram Oxalate   . Shrimp [Shellfish Allergy] Other (See Comments)    On allergy testing only  . Enalapril Rash    Family History  Problem Relation Age of Onset  . COPD Mother   . COPD Father   . Urolithiasis Brother   . Arthritis Maternal Grandmother   . Depression Brother   . Kidney cancer Maternal Grandfather       Prior to Admission medications   Medication Sig Start Date End Date Taking? Authorizing Provider  acyclovir (ZOVIRAX) 400 MG tablet Take 1 tablet (400 mg total) by mouth daily. Take one tablet once daily to prevent infection. 09/03/12  Yes Kendrick Ranch, MD  buPROPion (WELLBUTRIN XL) 150 MG 24 hr tablet Take 450 mg by mouth daily.    Yes Historical Provider, MD  calcium-vitamin D (OSCAL WITH D) 500-200 MG-UNIT per tablet Take 1 tablet by mouth daily.     Yes Historical Provider, MD  DYMISTA 137-50 MCG/ACT SUSP Place 1 spray into the nose Twice daily. Each nostril 04/02/11  Yes Historical Provider, MD  EPIPEN 2-PAK 0.3 MG/0.3ML DEVI  06/02/11  Yes Historical Provider, MD  Eszopiclone 3 MG TABS Take 3 mg by mouth at bedtime. Take immediately before bedtime   Yes Historical Provider, MD  levothyroxine (  SYNTHROID, LEVOTHROID) 75 MCG tablet Take 1 tablet (75 mcg total) by mouth daily. Take one tablet once daily for thyroid supplement. 09/03/12  Yes Kendrick Ranch, MD  loratadine (CLARITIN) 10 MG tablet Take one tablet once daily as needed for allergies.    Yes Historical Provider, MD  LORazepam (ATIVAN) 1 MG tablet 3 mg. Take two tablets nightly for sleep   Yes Historical Provider, MD  Probiotic Product (PROBIOTIC FORMULA PO) Take 1 capsule by mouth daily.   Yes Historical Provider, MD  simvastatin (ZOCOR) 10 MG tablet  Take 1 tablet (10 mg total) by mouth at bedtime. 09/03/12 09/03/13 Yes Kendrick Ranch, MD  tazarotene (AVAGE) 0.1 % cream Apply once nightly for acne    Yes Historical Provider, MD  triamterene-hydrochlorothiazide (MAXZIDE-25) 37.5-25 MG per tablet Take 1 tablet by mouth daily. 09/03/12  Yes Kendrick Ranch, MD  Vortioxetine HBr (BRINTELLIX) 5 MG TABS Take 20 mg by mouth daily.    Yes Historical Provider, MD   Physical Exam: Filed Vitals:   09/24/12 1725 09/24/12 2039 09/24/12 2101 09/24/12 2220  BP: 159/101 121/74 116/80 118/79  Pulse: 97 90 98 87  Temp: 98.8 F (37.1 C) 98.3 F (36.8 C)  98.2 F (36.8 C)  TempSrc: Oral Oral  Oral  Resp:  18  19  Height: 5\' 4"  (1.626 m)   5\' 4"  (1.626 m)  Weight: 73.029 kg (161 lb)   74.707 kg (164 lb 11.2 oz)  SpO2: 98% 92% 100% 98%     General:  Well-developed well-nourished.  Eyes: Anicteric no pallor.  ENT: No discharge from the site also mouth.  Neck: No mass felt.  Cardiovascular: S1-S2 heard.  Respiratory: No rhonchi or crepitations.  Abdomen: Soft nontender bowel sounds present. No guarding or rigidity.  Skin: No rash.  Musculoskeletal: No edema.  Psychiatric: Appears normal.  Neurologic: Alert awake oriented to time place and person. Moves all extremities.  Labs on Admission:  Basic Metabolic Panel:  Recent Labs Lab 09/24/12 1745  NA 135  K 3.6  CL 94*  CO2 27  GLUCOSE 125*  BUN 34*  CREATININE 1.30*  CALCIUM 10.9*   Liver Function Tests:  Recent Labs Lab 09/24/12 1745  AST 40*  ALT 32  ALKPHOS 83  BILITOT 0.8  PROT 6.8  ALBUMIN 3.6   No results found for this basename: LIPASE, AMYLASE,  in the last 168 hours No results found for this basename: AMMONIA,  in the last 168 hours CBC:  Recent Labs Lab 09/24/12 1745  WBC 16.5*  HGB 15.8*  HCT 44.5  MCV 91.4  PLT 317   Cardiac Enzymes: No results found for this basename: CKTOTAL, CKMB, CKMBINDEX, TROPONINI,  in the last 168 hours  BNP  (last 3 results) No results found for this basename: PROBNP,  in the last 8760 hours CBG: No results found for this basename: GLUCAP,  in the last 168 hours  Radiological Exams on Admission: Ct Abdomen Pelvis W Contrast  09/24/2012   *RADIOLOGY REPORT*  Clinical Data: Middle abdominal pain.  Nausea, vomiting and diarrhea.  Urinary incontinence for 1 day.  CT ABDOMEN AND PELVIS WITH CONTRAST  Technique:  Multidetector CT imaging of the abdomen and pelvis was performed following the standard protocol during bolus administration of intravenous contrast.  Contrast: 50mL OMNIPAQUE IOHEXOL 300 MG/ML  SOLN, 80mL OMNIPAQUE IOHEXOL 300 MG/ML  SOLN  Comparison: 02/01/2006  Findings: There is wall thickening of the descending colon and proximal sigmoid colon with associated  pericolonic fat inflammation.  There are numerous sigmoid colon diverticula with other diverticula scattered elsewhere throughout the colon.  Given the relatively long segment of bowel involved with wall thickening and adjacent inflammation, diverticulitis is felt less likely. This is more likely a diffuse infectious or inflammatory colitis. The transverse right colon showed diverticula but no inflammatory changes.  A normal the appendix is not visualized, but there are no findings of appendicitis.  The small bowel is unremarkable.  The stomach is moderately distended and there is a moderate sized hiatal hernia with the surgical vascular clips at the esophageal hiatus and may be from previous hiatal hernia surgery or from the Nissen fundoplication.  There are minor reticular opacities at the lung bases consistent with subsegmental atelectasis.  The liver, spleen and pancreas normal appearing gallbladder is distended but otherwise unremarkable.  There is no bile duct dilation.  There are no adrenal masses.  There is a mid pole right renal cyst.  The kidneys are otherwise unremarkable.  Normal ureters and bladder.  The uterus and adnexa are  unremarkable.  No adenopathy is seen.  There is a trace amount of ascites along the right lateral margin of the liver and collecting in the posterior pelvic recess.  No osteoblastic or osteolytic lesions.  IMPRESSION: Wall thickening and adjacent fat inflammation is seen along the entire descending colon in the proximal sigmoid colon most likely due to an infectious or inflammatory colitis.  There are numerous diverticula.  Diverticulitis is not excluded but felt less likely given the extent of colonic involvement.  Trace amount of ascites.  Moderate gastric distention which may be a mild ileus related to the colonic inflammation.  Small to moderate hiatal hernia.  No other acute findings.   Original Report Authenticated By: Amie Portland, M.D.     Assessment/Plan Principal Problem:   Abdominal pain Active Problems:   HTN (hypertension)   Diverticulitis   Nausea vomiting and diarrhea   1. Abdominal pain most likely secondary to colitis/diverticulitis - check stool for C. difficile and cultures. Continue with hydration and IV antibiotics (Cipro and Flagyl). Check lactic acid levels. 2. Acute renal failure probably from dehydration - continue with hydration and closely follow metabolic panel and intake output. 3. Mild hypercalcemia - prior labs also show hypercalcemia. May need outpatient workup. 4. Depression - continue present medications. 5. Hypothyroidism - continue present medications. 6. Hypertension - continue present medications. 7. Hyperlipidemia.    Code Status: Full code.  Family Communication: None.  Disposition Plan: Admit to inpatient.    Barbera Perritt N. [facility] Pager [phone].  If 7PM-7AM, please contact night-coverage [email or web address] Password [facility] 09/25/2012, 12:39 AM

## 2012-09-25 NOTE — Progress Notes (Signed)
Patient seen and examined, admitted by Dr. Rueben Bash this morning, with acute diverticulitis  BP 113/60  Pulse 85  Temp(Src) 98.6 F (37 C) (Oral)  Resp 16  Ht 5\' 4"  (1.626 m)  Wt 74.707 kg (164 lb 11.2 oz)  BMI 28.26 kg/m2  SpO2 98%  Except for mild left lower quadrant tenderness, exam benign  Acute diverticulitis Patient has been tolerating clear liquid diet, advance diet to full liquids -Will likely need a coloscopy, made appointment with Dr. Juanda Chance for September 5th for followup, defer C. scope to GI - Will advance to low fiber diet tonight  Hypokalemia: Replaced  Diarrhea: Resolved no bowel movement since yesterday  If tolerating solids tomorrow, will DC home in am   Marcia Adams M.D. [facility] 09/25/2012, 2:18 PM  Pager: [phone]

## 2012-09-25 NOTE — Progress Notes (Signed)
57yo female c/o abdominal pain w/ N/V, CT reveals infectious/inflammatory colitis w/ possible diverticulitis, to begin IV ABX.  Will start Cipro 400mg  IV Q12H and monitor clinical progression.  Vernard Gambles, PharmD, BCPS 09/25/2012 12:43 AM

## 2012-09-25 NOTE — Progress Notes (Signed)
INITIAL NUTRITION ASSESSMENT  DOCUMENTATION CODES Per approved criteria  -Not Applicable   INTERVENTION:  No nutrition intervention at this time ---> patient declined RD to follow for nutrition care plan  NUTRITION DIAGNOSIS: Inadequate oral intake related to acute diverticulitis as evidenced by patient report  Goal: Pt to meet >/= 90% of their estimated nutrition needs   Monitor:  PO intake, weight, labs, I/O's  Reason for Assessment: Malnutrition Screening Tool Report  57 y.o. female  Admitting Dx: Abdominal pain  ASSESSMENT: Patient with PMH of hyperthyroidism, HTN, depression and HLD admitted for nausea, vomiting, diarrhea and lower abdominal pain ---> CT abdomen and pelvis were showing features concerning for colitis involving the descending and sigmoid colon.   Patient reports the last time she had solid food was dinner Monday evening, 8/18; started having N/V/D; had Clear Liquid tray for breakfast, Full Liquids for lunch and will have solids (Low Fiber) for dinner tonight; declined addition of supplements.  Height: Ht Readings from Last 1 Encounters:  09/24/12 5\' 4"  (1.626 m)    Weight: Wt Readings from Last 1 Encounters:  09/24/12 164 lb 11.2 oz (74.707 kg)    Ideal Body Weight: 120 lb  % Ideal Body Weight: 136%  Wt Readings from Last 10 Encounters:  09/24/12 164 lb 11.2 oz (74.707 kg)  09/03/12 163 lb (73.936 kg)  01/07/12 192 lb 12.8 oz (87.454 kg)  08/28/11 210 lb (95.255 kg)  05/16/11 214 lb 1.3 oz (97.106 kg)  04/24/11 218 lb 1.9 oz (98.939 kg)    Usual Body Weight: 163 lb  % Usual Body Weight: 101%  BMI:  Body mass index is 28.26 kg/(m^2).  Estimated Nutritional Needs: Kcal: 1700-1900 Protein: 80-90 gm Fluid: 1.7-1.9 L  Skin: Intact  Diet Order: Full Liquid  EDUCATION NEEDS: -No education needs identified at this time   Intake/Output Summary (Last 24 hours) at 09/25/12 1507 Last data filed at 09/25/12 1456  Gross per 24 hour   Intake   2165 ml  Output      0 ml  Net   2165 ml    Labs:   Recent Labs Lab 09/24/12 1745 09/25/12 0500 09/25/12 1150  NA 135 133*  --   K 3.6 2.9*  --   CL 94* 96  --   CO2 27 25  --   BUN 34* 23  --   CREATININE 1.30* 1.02  --   CALCIUM 10.9* 8.6  --   MG  --   --  1.8  GLUCOSE 125* 92  --     Scheduled Meds: . acyclovir  400 mg Oral Daily  . buPROPion  450 mg Oral Daily  . ciprofloxacin  400 mg Intravenous Q12H  . levothyroxine  75 mcg Oral QAC breakfast  . loratadine  10 mg Oral Daily  . LORazepam  3 mg Oral QHS  . metronidazole  500 mg Intravenous Q8H  . potassium chloride  10 mEq Intravenous Q1 Hr x 3  . potassium chloride  40 mEq Oral Q4H  . simvastatin  10 mg Oral QHS  . Vortioxetine HBr  20 mg Oral Daily    Continuous Infusions: . sodium chloride 100 mL/hr at 09/25/12 0547    Past Medical History  Diagnosis Date  . Depression   . Hypertension   . GERD (gastroesophageal reflux disease)   . Hyperlipidemia   . Menopause   . Thyroid disease   . Insomnia   . Anemia   . Anxiety   .  Vitamin D deficiency   . Hiatal hernia   . Esophageal stricture     Past Surgical History  Procedure Laterality Date  . Nasal sinus surgery  7/99  . Nissen fundoplication  7/07  . Hemorrhoid surgery    . Mass excision  06/08/2011    Procedure: MINOR EXCISION OF MASS;  Surgeon: Wyn Forster., MD;  Location: [facility];  Service: Orthopedics;  Laterality: Right;  excisional biopsy dorsal right hand  . Retinal detachment surgery      Gas bubble in center of right eye.  Pt not to lye completely flat.    Maureen Chatters, RD, LDN Pager #: [phone] After-Hours Pager #: [phone]

## 2012-09-26 DIAGNOSIS — K5732 Diverticulitis of large intestine without perforation or abscess without bleeding: Principal | ICD-10-CM

## 2012-09-26 DIAGNOSIS — N179 Acute kidney failure, unspecified: Secondary | ICD-10-CM

## 2012-09-26 DIAGNOSIS — E785 Hyperlipidemia, unspecified: Secondary | ICD-10-CM

## 2012-09-26 DIAGNOSIS — E039 Hypothyroidism, unspecified: Secondary | ICD-10-CM

## 2012-09-26 DIAGNOSIS — F411 Generalized anxiety disorder: Secondary | ICD-10-CM

## 2012-09-26 LAB — CBC
MCH: 32.9 pg (ref 26.0–34.0)
MCHC: 35.3 g/dL (ref 30.0–36.0)
Platelets: 192 10*3/uL (ref 150–400)
RBC: 3.59 MIL/uL — ABNORMAL LOW (ref 3.87–5.11)

## 2012-09-26 LAB — BASIC METABOLIC PANEL
BUN: 10 mg/dL (ref 6–23)
Calcium: 8.5 mg/dL (ref 8.4–10.5)
GFR calc Af Amer: 87 mL/min — ABNORMAL LOW (ref >90)
GFR calc non Af Amer: 75 mL/min — ABNORMAL LOW (ref >90)
Glucose, Bld: 87 mg/dL (ref 70–99)
Sodium: 139 mEq/L (ref 135–145)

## 2012-09-26 MED ORDER — PROMETHAZINE HCL 12.5 MG PO TABS: 12.5000 mg | ORAL_TABLET | Freq: Four times a day (QID) | ORAL | Status: DC | PRN

## 2012-09-26 MED ORDER — ACYCLOVIR 400 MG PO TABS
400.0000 mg | ORAL_TABLET | Freq: Every day | ORAL | Status: DC
  Administered 2012-09-26: 400 mg via ORAL
  Filled 2012-09-26: qty 1

## 2012-09-26 MED ORDER — TRAMADOL HCL 50 MG PO TABS: 50.0000 mg | ORAL_TABLET | Freq: Four times a day (QID) | ORAL | Status: DC | PRN

## 2012-09-26 MED ORDER — CIPROFLOXACIN HCL 500 MG PO TABS: 500.0000 mg | ORAL_TABLET | Freq: Two times a day (BID) | ORAL | Status: DC

## 2012-09-26 MED ORDER — METRONIDAZOLE 500 MG PO TABS: 500.0000 mg | ORAL_TABLET | Freq: Three times a day (TID) | ORAL | Status: DC

## 2012-09-26 NOTE — Progress Notes (Signed)
Patient alert and oriented, vital signs stable. No complaints of pain or nausea. Discharged orders received, patient education provided and patient d/c to home. Aldean Ast 09/26/2012

## 2012-09-26 NOTE — Discharge Summary (Signed)
Physician Discharge Summary  Patient ID: Marcia Adams MRN: [id number] DOB/AGE: 09/02/1955 57 y.o.  Admit date: 09/24/2012 Discharge date: 09/26/2012  Primary Care Physician:  Levon Hedger, MD  Discharge Diagnoses:    . Acute Diverticulitis . Abdominal pain- resolved  . Nausea vomiting and diarrhea- resolved  . HTN (hypertension) . ARF (acute renal failure) Hypokalemia  Consults:  None   Recommendations for Outpatient Follow-up:  1. patient had lower BP with history of hypertension, hence triamterene/hydrochlorothiazide has been stopped 2. please check BMET for potassium and renal function. Her potassium was 2.9 on admission, may benefit from a different antihypertensive. K 3.7 at discharge  Allergies:   Allergies  Allergen Reactions  . Aripiprazole Other (See Comments)    Excessive weight gain  . Escitalopram Oxalate   . Shrimp [Shellfish Allergy] Other (See Comments)    On allergy testing only  . Enalapril Rash     Discharge Medications:   Medication List    STOP taking these medications       triamterene-hydrochlorothiazide 37.5-25 MG per tablet  Commonly known as:  MAXZIDE-25      TAKE these medications       acyclovir 400 MG tablet  Commonly known as:  ZOVIRAX  Take 1 tablet (400 mg total) by mouth daily. Take one tablet once daily to prevent infection.     BRINTELLIX 5 MG Tabs  Generic drug:  Vortioxetine HBr  Take 20 mg by mouth daily.     buPROPion 150 MG 24 hr tablet  Commonly known as:  WELLBUTRIN XL  Take 450 mg by mouth daily.     calcium-vitamin D 500-200 MG-UNIT per tablet  Commonly known as:  OSCAL WITH D  Take 1 tablet by mouth daily.     ciprofloxacin 500 MG tablet  Commonly known as:  CIPRO  Take 1 tablet (500 mg total) by mouth 2 (two) times daily. For 2 weeks     DYMISTA 137-50 MCG/ACT Susp  Generic drug:  Azelastine-Fluticasone  Place 1 spray into the nose Twice daily. Each nostril     EPIPEN 2-PAK 0.3 mg/0.3 mL Soaj  injection  Generic drug:  EPINEPHrine     Eszopiclone 3 MG Tabs  Take 3 mg by mouth at bedtime. Take immediately before bedtime     levothyroxine 75 MCG tablet  Commonly known as:  SYNTHROID, LEVOTHROID  Take 1 tablet (75 mcg total) by mouth daily. Take one tablet once daily for thyroid supplement.     loratadine 10 MG tablet  Commonly known as:  CLARITIN  Take one tablet once daily as needed for allergies.     LORazepam 1 MG tablet  Commonly known as:  ATIVAN  3 mg. Take two tablets nightly for sleep     metroNIDAZOLE 500 MG tablet  Commonly known as:  FLAGYL  Take 1 tablet (500 mg total) by mouth 3 (three) times daily. For 2 weeks     PROBIOTIC FORMULA PO  Take 1 capsule by mouth daily.     promethazine 12.5 MG tablet  Commonly known as:  PHENERGAN  Take 1 tablet (12.5 mg total) by mouth every 6 (six) hours as needed for nausea.     simvastatin 10 MG tablet  Commonly known as:  ZOCOR  Take 1 tablet (10 mg total) by mouth at bedtime.     tazarotene 0.1 % cream  Commonly known as:  AVAGE  Apply once nightly for acne     traMADol 50 MG tablet  Commonly  known as:  ULTRAM  Take 1 tablet (50 mg total) by mouth every 6 (six) hours as needed for pain.         Brief H and P: For complete details please refer to admission H and P, but in brief Marcia Adams is a 57 y.o. female with history of hyperthyroidism, hypertension depression and hyperlipidemia had been experiencing nausea, vomiting and diarrhea lasting for hours. Patient was also having lower abdominal pain, crampy in nature but denied any fevers or chills. Since patient's pain was persistent with nausea vomiting and diarrhea, patient presented to the ER. CT abdomen and pelvis showed features concerning for colitis involving the descending and sigmoid colon. Patient denied having taken any recent antibiotics or been hospitalized. Patient otherwise denied any chest pain or shortness of breath. Patient has not had any  colonoscopy before.      Hospital Course:  Acute diverticulitis: Patient was admitted with symptoms of nausea, vomiting, crampy abdominal pain and diarrhea. CT of the abdomen and pelvis showed wall thickening in the adjacent fat inflammation along the entire descending colon in the proximal sigmoid colon most likely due to infectious or inflammatory colitis. Numerous diverticula seen, moderate gastric distention may be mild ileus related to colonic inflammation. Patient was placed on n.p.o. status initially, IV fluid hydration, IV ciprofloxacin and Flagyl. At that time RTC, she's tolerating regular diet without any symptoms. She was recommended 2 weeks of oral ciprofloxacin and Flagyl. I have made an appointment for her with Dr. Lina Sar from [facility] gastroenterology, she may need colonoscopy in 6-8 weeks for surveillance.  Hypokalemia with borderline hypertension: Patient's triamterene/HCTZ was held. Potassium was replaced. I recommended her to hold antihypertensive and to check her blood pressure daily. She should have a repeat renal panel checked at the time of follow-up.  Day of Discharge BP 92/56  Pulse 78  Temp(Src) 98.7 F (37.1 C) (Oral)  Resp 18  Ht 5\' 4"  (1.626 m)  Wt 74.707 kg (164 lb 11.2 oz)  BMI 28.26 kg/m2  SpO2 97%  Physical Exam: General: Alert and awake oriented x3 not in any acute distress. CVS: S1-S2 clear no murmur rubs or gallops Chest: clear to auscultation bilaterally, no wheezing rales or rhonchi Abdomen: soft nontender, nondistended, normal bowel sounds, no organomegaly Extremities: no cyanosis, clubbing or edema noted bilaterally Neuro: Cranial nerves II-XII intact, no focal neurological deficits   The results of significant diagnostics from this hospitalization (including imaging, microbiology, ancillary and laboratory) are listed below for reference.    LAB RESULTS: Basic Metabolic Panel:  Recent Labs Lab 09/25/12 0500 09/25/12 1150 09/25/12 1717  09/26/12 0945  NA 133*  --   --  139  K 2.9*  --  4.4 3.7  CL 96  --   --  106  CO2 25  --   --  24  GLUCOSE 92  --   --  87  BUN 23  --   --  10  CREATININE 1.02  --   --  0.84  CALCIUM 8.6  --   --  8.5  MG  --  1.8  --   --    Liver Function Tests:  Recent Labs Lab 09/24/12 1745 09/25/12 0500  AST 40* 25  ALT 32 20  ALKPHOS 83 59  BILITOT 0.8 0.6  PROT 6.8 5.6*  ALBUMIN 3.6 2.8*   No results found for this basename: LIPASE, AMYLASE,  in the last 168 hours No results found for this basename:  AMMONIA,  in the last 168 hours CBC:  Recent Labs Lab 09/25/12 0500 09/26/12 0945  WBC 11.8* 5.7  NEUTROABS 7.9*  --   HGB 12.5 11.8*  HCT 36.4 33.4*  MCV 92.9 93.0  PLT 222 192   Cardiac Enzymes: No results found for this basename: CKTOTAL, CKMB, CKMBINDEX, TROPONINI,  in the last 168 hours BNP: No components found with this basename: POCBNP,  CBG: No results found for this basename: GLUCAP,  in the last 168 hours  Significant Diagnostic Studies:  Ct Abdomen Pelvis W Contrast  09/24/2012   *RADIOLOGY REPORT*  Clinical Data: Middle abdominal pain.  Nausea, vomiting and diarrhea.  Urinary incontinence for 1 day.  CT ABDOMEN AND PELVIS WITH CONTRAST  Technique:  Multidetector CT imaging of the abdomen and pelvis was performed following the standard protocol during bolus administration of intravenous contrast.  Contrast: 50mL OMNIPAQUE IOHEXOL 300 MG/ML  SOLN, 80mL OMNIPAQUE IOHEXOL 300 MG/ML  SOLN  Comparison: 02/01/2006  Findings: There is wall thickening of the descending colon and proximal sigmoid colon with associated pericolonic fat inflammation.  There are numerous sigmoid colon diverticula with other diverticula scattered elsewhere throughout the colon.  Given the relatively long segment of bowel involved with wall thickening and adjacent inflammation, diverticulitis is felt less likely. This is more likely a diffuse infectious or inflammatory colitis. The transverse right  colon showed diverticula but no inflammatory changes.  A normal the appendix is not visualized, but there are no findings of appendicitis.  The small bowel is unremarkable.  The stomach is moderately distended and there is a moderate sized hiatal hernia with the surgical vascular clips at the esophageal hiatus and may be from previous hiatal hernia surgery or from the Nissen fundoplication.  There are minor reticular opacities at the lung bases consistent with subsegmental atelectasis.  The liver, spleen and pancreas normal appearing gallbladder is distended but otherwise unremarkable.  There is no bile duct dilation.  There are no adrenal masses.  There is a mid pole right renal cyst.  The kidneys are otherwise unremarkable.  Normal ureters and bladder.  The uterus and adnexa are unremarkable.  No adenopathy is seen.  There is a trace amount of ascites along the right lateral margin of the liver and collecting in the posterior pelvic recess.  No osteoblastic or osteolytic lesions.  IMPRESSION: Wall thickening and adjacent fat inflammation is seen along the entire descending colon in the proximal sigmoid colon most likely due to an infectious or inflammatory colitis.  There are numerous diverticula.  Diverticulitis is not excluded but felt less likely given the extent of colonic involvement.  Trace amount of ascites.  Moderate gastric distention which may be a mild ileus related to the colonic inflammation.  Small to moderate hiatal hernia.  No other acute findings.   Original Report Authenticated By: Amie Portland, M.D.      Disposition and Follow-up:     Discharge Orders   Future Appointments Provider Department Dept Phone   09/30/2012 12:00 PM Kendrick Ranch, MD [facility] [place] [phone]   10/10/2012 3:00 PM Hart Carwin, MD [facility] [phone]   12/01/2012 8:15 AM Kendrick Ranch, MD [facility] [place] [phone]   Future  Orders Complete By Expires   Diet - low sodium heart healthy  As directed    Increase activity slowly  As directed        DISPOSITION: Home  DIET: Patient was recommended low fiber diet  for next few weeks until diverticulitis is improved  ACTIVITY: As tolerated   TESTS THAT NEED FOLLOW-UP BMET at the followup appointment   DISCHARGE FOLLOW-UP Follow-up Information   Follow up with Lina Sar, MD On 10/10/2012. (at 3:00PM)    Specialty:  Gastroenterology   Contact information:   [address] [place] [address] [phone]       Follow up with SCHOENHOFF,DEBBIE, MD. Schedule an appointment as soon as possible for a visit in 10 days. (for hospital follow-up, please have them check renal panel/BMET for potassium level   )    Specialty:  Internal Medicine   Contact information:   [address] [place] [address] [phone]       Time spent on Discharge: 40 mins  Signed:   Makahla Kiser M.D. [facility] 09/26/2012, 11:53 AM Pager: [phone]

## 2012-09-30 ENCOUNTER — Encounter: Payer: Self-pay | Admitting: Internal Medicine

## 2012-09-30 ENCOUNTER — Ambulatory Visit (INDEPENDENT_AMBULATORY_CARE_PROVIDER_SITE_OTHER): Payer: BC Managed Care – PPO | Admitting: Internal Medicine

## 2012-09-30 VITALS — BP 122/74 | HR 85 | Temp 97.8°F | Resp 18 | Wt 164.0 lb

## 2012-09-30 DIAGNOSIS — H3320 Serous retinal detachment, unspecified eye: Secondary | ICD-10-CM | POA: Insufficient documentation

## 2012-09-30 DIAGNOSIS — R935 Abnormal findings on diagnostic imaging of other abdominal regions, including retroperitoneum: Secondary | ICD-10-CM

## 2012-09-30 DIAGNOSIS — E876 Hypokalemia: Secondary | ICD-10-CM

## 2012-09-30 DIAGNOSIS — R197 Diarrhea, unspecified: Secondary | ICD-10-CM

## 2012-09-30 NOTE — Progress Notes (Signed)
Subjective:    Patient ID: Marcia Adams, female    DOB: 01/17/1956, 57 y.o.   MRN: [id number]  HPI Adrine is here for HFU  ADmitted 8/20-8/22 with severe vomiting/diarrheal illness See Ct.  She was treated for diverticulitis but very long segment of large bowel involved questioning whether this was infact infectious or inflammatory bowel.  Seh does have a follow up appt with Dr. Juanda Chance in early September  Dehedration in hospital with low K+  She is feeling much better, stool more formed  No vomiting no fever.    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 Aripiprazole Other (See Comments)    Excessive weight gain  . Escitalopram Oxalate   . Shrimp [Shellfish Allergy] Other (See Comments)    On allergy testing only  . Enalapril Rash   Past Medical History  Diagnosis Date  . Depression   . Hypertension   . GERD (gastroesophageal reflux disease)   . Hyperlipidemia   . Menopause   . Thyroid disease   . Insomnia   . Anemia   . Anxiety   . Vitamin D deficiency   . Hiatal hernia   . Esophageal stricture    Past Surgical History  Procedure Laterality Date  . Nasal sinus surgery  7/99  . Nissen fundoplication  7/07  . Hemorrhoid surgery    . Mass excision  06/08/2011    Procedure: MINOR EXCISION OF MASS;  Surgeon: Wyn Forster., MD;  Location: [facility];  Service: Orthopedics;  Laterality: Right;  excisional biopsy dorsal right hand  . Retinal detachment surgery      Gas bubble in center of right eye.  Pt not to lye completely flat.   History   Social History  . Marital Status: Divorced    Spouse Name: N/A    Number of Children: N/A  . Years of Education: N/A   Occupational History  . Not on file.   Social History Main Topics  .  Smoking status: Never Smoker   . Smokeless tobacco: Never Used  . Alcohol Use: No     Comment: occasionally  . Drug Use: No  . Sexual Activity: No   Other Topics Concern  . Not on file   Social History Narrative  . No narrative on file   Family History  Problem Relation Age of Onset  . COPD Mother   . COPD Father   . Urolithiasis Brother   . Arthritis Maternal Grandmother   . Depression Brother   . Kidney cancer Maternal Grandfather    Patient Active Problem List   Diagnosis Date Noted  . Diarrhea 09/30/2012  . Retinal detachment 09/30/2012  . Diverticulitis 09/25/2012  . Abdominal pain 09/25/2012  . Nausea vomiting and diarrhea 09/25/2012  . ARF (acute renal failure) 09/25/2012  . Hypercalcemia 09/17/2012  . 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 Postmenopausal atrophic vaginitis 05/26/2011  . Herpes 04/24/2011  . Anemia   . Anxiety   . Acne vulgaris 04/21/2008  . Keloid 10/22/2007  . Hypothyroidism 04/23/2007  . Carpal tunnel syndrome 10/22/2006  . Benign neoplasm of skin 02/27/2006  . Pain, neck 02/27/2006  . Paresthesia 02/27/2006  . Reflux esophagitis 02/20/2005  .  Obesity 10/04/2004  . Heat exhaustion 10/04/2004  . Depression 11/10/2003  . Allergic rhinitis, cause unspecified 11/10/2003  . Calculus of ureter 11/10/2003  . Insomnia 11/10/2003  . Disorder of bone and cartilage, unspecified 03/24/2003  . Plantar fasciitis 03/04/2002  . Hyperlipidemia 01/14/2002  . Fatigue 12/03/2001  . Pain in the chest 12/03/2001  . Pain, abdominal, epigastric 12/03/2001  . UTI (urinary tract infection) 03/25/2000  . Hemorrhoids, external 03/02/1999  . HTN (hypertension) 01/24/1999  . Allergic rhinitis due to dust 01/24/1999   Current Outpatient Prescriptions on File Prior to Visit  Medication Sig Dispense Refill  . acyclovir (ZOVIRAX) 400 MG tablet Take 1 tablet (400 mg total) by mouth daily. Take one tablet once daily to prevent infection.  90 tablet  3  . buPROPion  (WELLBUTRIN XL) 150 MG 24 hr tablet Take 450 mg by mouth daily.       . calcium-vitamin D (OSCAL WITH D) 500-200 MG-UNIT per tablet Take 0.5 tablets by mouth daily.       . ciprofloxacin (CIPRO) 500 MG tablet Take 1 tablet (500 mg total) by mouth 2 (two) times daily. For 2 weeks  28 tablet  0  . DYMISTA 137-50 MCG/ACT SUSP Place 1 spray into the nose Twice daily. Each nostril      . Eszopiclone 3 MG TABS Take 3 mg by mouth at bedtime. Take immediately before bedtime      . levothyroxine (SYNTHROID, LEVOTHROID) 75 MCG tablet Take 1 tablet (75 mcg total) by mouth daily. Take one tablet once daily for thyroid supplement.  90 tablet  3  . loratadine (CLARITIN) 10 MG tablet Take one tablet once daily as needed for allergies.       Marland Kitchen LORazepam (ATIVAN) 1 MG tablet 3 mg. Take two tablets nightly for sleep      . metroNIDAZOLE (FLAGYL) 500 MG tablet Take 1 tablet (500 mg total) by mouth 3 (three) times daily. For 2 weeks  42 tablet  0  . Probiotic Product (PROBIOTIC FORMULA PO) Take 1 capsule by mouth daily.      . simvastatin (ZOCOR) 10 MG tablet Take 1 tablet (10 mg total) by mouth at bedtime.  90 tablet  1  . Vortioxetine HBr (BRINTELLIX) 5 MG TABS Take 20 mg by mouth daily.       Marland Kitchen EPIPEN 2-PAK 0.3 MG/0.3ML DEVI       . promethazine (PHENERGAN) 12.5 MG tablet Take 1 tablet (12.5 mg total) by mouth every 6 (six) hours as needed for nausea.  30 tablet  0  . tazarotene (AVAGE) 0.1 % cream Apply once nightly for acne       . traMADol (ULTRAM) 50 MG tablet Take 1 tablet (50 mg total) by mouth every 6 (six) hours as needed for pain.  30 tablet  0  . [DISCONTINUED] azelastine (ASTELIN) 137 MCG/SPRAY nasal spray One spray in each nostril twice daily as needed for allergies.       . [DISCONTINUED] cloNIDine (CATAPRES) 0.1 MG tablet Take one tablet up to three times daily as needed to reduce excessive sweating.       . [DISCONTINUED] mirtazapine (REMERON) 15 MG tablet Take one tablet once daily at bedtime        . [DISCONTINUED] spironolactone (ALDACTONE) 50 MG tablet Take one tablet twice daily for acne.        No current facility-administered medications on file prior to visit.       Review of Systems See HPI  Objective:   Physical Exam Physical Exam  Nursing note and vitals reviewed.  Constitutional: She is oriented to person, place, and time. She appears well-developed and well-nourished.  HENT:  Head: Normocephalic and atraumatic.  Cardiovascular: Normal rate and regular rhythm. Exam reveals no gallop and no friction rub.  No murmur heard.  Pulmonary/Chest: Breath sounds normal. She has no wheezes. She has no rales. Abd:  Soft  BS +  NT/ND  Neurological: She is alert and oriented to person, place, and time.  Skin: Skin is warm and dry.  Psychiatric: She has a normal mood and affect. Her behavior is normal.             Assessment & Plan:  Severe vomiting/diarrhea  R/O divertic vs infectious vs inflammatory  Will have colonoscopy pending with her GI MD.  ADvised to avoid dairy and high fiber until stools return to normal  Renal insufficiency  Corrected by discharge  Hypokalemia  Will recheck today  She was on Maxzide and would like to keep this as she has problems with edema.  Ok to resume and advised to increase her fluid intake  Hypercalcemia  Will recheck today with PTH  HTN  OK to resume her Maxzide  See me at her schedule appt or sooner as needed

## 2012-09-30 NOTE — Patient Instructions (Addendum)
See me at next appt

## 2012-10-02 LAB — CBC WITH DIFFERENTIAL/PLATELET
Basophils Absolute: 0 10*3/uL (ref 0.0–0.1)
Lymphocytes Relative: 38 % (ref 12–46)
Lymphs Abs: 2.2 10*3/uL (ref 0.7–4.0)
Neutro Abs: 2.8 10*3/uL (ref 1.7–7.7)
Platelets: 322 10*3/uL (ref 150–400)
RBC: 4.02 MIL/uL (ref 3.87–5.11)
RDW: 13.6 % (ref 11.5–15.5)
WBC: 5.8 10*3/uL (ref 4.0–10.5)

## 2012-10-03 ENCOUNTER — Encounter: Payer: Self-pay | Admitting: *Deleted

## 2012-10-03 LAB — COMPREHENSIVE METABOLIC PANEL
ALT: 23 U/L (ref 0–35)
AST: 33 U/L (ref 0–37)
Albumin: 3.8 g/dL (ref 3.5–5.2)
Calcium: 9.3 mg/dL (ref 8.4–10.5)
Chloride: 108 mEq/L (ref 96–112)
Potassium: 4.1 mEq/L (ref 3.5–5.3)
Sodium: 140 mEq/L (ref 135–145)
Total Protein: 5.7 g/dL — ABNORMAL LOW (ref 6.0–8.3)

## 2012-10-07 ENCOUNTER — Encounter: Payer: Self-pay | Admitting: *Deleted

## 2012-10-10 ENCOUNTER — Encounter: Payer: Self-pay | Admitting: Internal Medicine

## 2012-10-10 ENCOUNTER — Ambulatory Visit (INDEPENDENT_AMBULATORY_CARE_PROVIDER_SITE_OTHER): Payer: BC Managed Care – PPO | Admitting: Internal Medicine

## 2012-10-10 VITALS — BP 112/80 | HR 96 | Ht 64.0 in | Wt 163.5 lb

## 2012-10-10 DIAGNOSIS — R933 Abnormal findings on diagnostic imaging of other parts of digestive tract: Secondary | ICD-10-CM

## 2012-10-10 DIAGNOSIS — K5289 Other specified noninfective gastroenteritis and colitis: Secondary | ICD-10-CM

## 2012-10-10 MED ORDER — MOVIPREP 100 G PO SOLR: 1.0000 | Freq: Once | ORAL | Status: DC

## 2012-10-10 NOTE — Patient Instructions (Addendum)
You have been scheduled for a colonoscopy with propofol. Please follow written instructions given to you at your visit today.  Please pick up your prep kit at the pharmacy within the next 1-3 days. If you use inhalers (even only as needed), please bring them with you on the day of your procedure. Your physician has requested that you go to www.startemmi.com and enter the access code given to you at your visit today. This web site gives a general overview about your procedure. However, you should still follow specific instructions given to you by our office regarding your preparation for the procedure.  CC: Dr Constance Goltz

## 2012-10-10 NOTE — Progress Notes (Signed)
Marcia Adams January 15, 1956 MRN [id number]  History of Present Illness:  This is a 57 year old white female who is post recent hospitalization from 09/26/2012-09/28/2012 for  nausea, vomiting and acute colitis. A CT scan showed a long segment of the descending colon with pericolic stranding consistent with colitis. Diverticulitis was a possibility too. She had a small amount of pelvic fluid and a small hiatal hernia. Her white cell count was 16,500. She was treated with intravenous antibiotics and bowel rest. She has been gradually improving and has been able to advance her diet from full liquids to a low residue diet and currently to a regular diet. She denies abdominal pain. Her stools are getting more solid. She has a history of Nissen fundoplication which has been successful. There have been no complaints of gastroesophageal reflux.   Past Medical History  Diagnosis Date  . Depression   . Hypertension   . GERD (gastroesophageal reflux disease)   . Hyperlipidemia   . Menopause   . Hypothyroidism   . Insomnia   . Anemia   . Anxiety   . Vitamin D deficiency   . Hiatal hernia   . Esophageal stricture   . Renal insufficiency   . Diverticulitis   . Kidney stones   . Allergic rhinitis    Past Surgical History  Procedure Laterality Date  . Nasal sinus surgery  7/99  . Nissen fundoplication  7/07  . Hemorrhoid surgery    . Mass excision  06/08/2011    Procedure: MINOR EXCISION OF MASS;  Surgeon: Wyn Forster., MD;  Location: [facility];  Service: Orthopedics;  Laterality: Right;  excisional biopsy dorsal right hand  . Retinal detachment surgery      Gas bubble in center of right eye.  Pt not to lye completely flat.    reports that she has never smoked. She has never used smokeless tobacco. She reports that  drinks alcohol. She reports that she does not use illicit drugs. family history includes Arthritis in her maternal grandmother; COPD in her father and mother;  Depression in her brother; Eczema in her daughter; Heart failure in her father and mother; Kidney cancer in her maternal grandfather; Urolithiasis in her brother. Allergies  Allergen Reactions  . Aripiprazole Other (See Comments)    Excessive weight gain  . Escitalopram Oxalate   . Shrimp [Shellfish Allergy] Other (See Comments)    On allergy testing only  . Enalapril Rash        Review of Systems: Denies abdominal pain rectal bleeding fever  The remainder of the 10 point ROS is negative except as outlined in H&P   Physical Exam: General appearance  Well developed, in no distress. Eyes- non icteric. HEENT nontraumatic, normocephalic. Mouth no lesions, tongue papillated, no cheilosis. Neck supple without adenopathy, thyroid not enlarged, no carotid bruits, no JVD. Lungs Clear to auscultation bilaterally. Cor normal S1, normal S2, regular rhythm, no murmur,  quiet precordium. Abdomen: Soft abdomen with hyperactive bowel sounds. No palpable mass or tenderness. Liver edge at costal margin. Rectal: Not done. Extremities no pedal edema. Skin no lesions. Neurological alert and oriented x 3. Psychological normal mood and affect.  Assessment and Plan:  Problem #71 57 year old white female recovering from an episode of acute colitis or possibly diverticulitis. She has never had a colonoscopy but has no family history of colon cancer or inflammatory bowel disease. She is an appropriate candidate for a screening colonoscopy as she is slowly recovering from the acute episode.  We will wait 4 more weeks before scheduling her colonoscopy. We have discussed the sedation, prep and the procedure. She will continue to advance her diet and will resume her fiber supplements within the next 2 weeks.   10/10/2012 Lina Sar

## 2012-10-12 ENCOUNTER — Encounter: Payer: Self-pay | Admitting: Internal Medicine

## 2012-10-13 ENCOUNTER — Encounter: Payer: Self-pay | Admitting: Internal Medicine

## 2012-11-05 ENCOUNTER — Encounter: Payer: BC Managed Care – PPO | Admitting: Internal Medicine

## 2012-11-11 ENCOUNTER — Encounter: Payer: Self-pay | Admitting: Internal Medicine

## 2012-12-01 ENCOUNTER — Ambulatory Visit: Payer: BC Managed Care – PPO | Admitting: Internal Medicine

## 2012-12-03 ENCOUNTER — Encounter: Payer: BC Managed Care – PPO | Admitting: Internal Medicine

## 2012-12-11 ENCOUNTER — Other Ambulatory Visit: Payer: Self-pay

## 2012-12-12 ENCOUNTER — Encounter: Payer: Self-pay | Admitting: Internal Medicine

## 2012-12-23 ENCOUNTER — Encounter: Payer: BC Managed Care – PPO | Admitting: Internal Medicine

## 2013-03-20 ENCOUNTER — Other Ambulatory Visit: Payer: Self-pay | Admitting: Internal Medicine

## 2013-07-01 ENCOUNTER — Other Ambulatory Visit: Payer: Self-pay | Admitting: Internal Medicine

## 2013-07-02 ENCOUNTER — Other Ambulatory Visit: Payer: Self-pay | Admitting: *Deleted

## 2013-07-02 NOTE — Telephone Encounter (Signed)
Refill request

## 2013-07-05 MED ORDER — SIMVASTATIN 10 MG PO TABS: ORAL_TABLET | ORAL | Status: DC

## 2013-07-05 NOTE — Telephone Encounter (Signed)
Heather  Call pt and let her know I refilled 3 months of her cholesterol med  I need to see her in office once a year while taking this medication  (last seen 10/2012)  Schedule an OV with me in August or September    Thanks

## 2013-09-21 ENCOUNTER — Ambulatory Visit (INDEPENDENT_AMBULATORY_CARE_PROVIDER_SITE_OTHER): Payer: BC Managed Care – PPO | Admitting: Internal Medicine

## 2013-09-21 ENCOUNTER — Telehealth: Payer: Self-pay | Admitting: *Deleted

## 2013-09-21 ENCOUNTER — Encounter: Payer: Self-pay | Admitting: Internal Medicine

## 2013-09-21 VITALS — BP 106/83 | HR 97 | Temp 98.1°F | Resp 16 | Ht 64.0 in | Wt 157.0 lb

## 2013-09-21 DIAGNOSIS — K5289 Other specified noninfective gastroenteritis and colitis: Secondary | ICD-10-CM

## 2013-09-21 DIAGNOSIS — E038 Other specified hypothyroidism: Secondary | ICD-10-CM

## 2013-09-21 DIAGNOSIS — R87810 Cervical high risk human papillomavirus (HPV) DNA test positive: Secondary | ICD-10-CM | POA: Diagnosis not present

## 2013-09-21 DIAGNOSIS — Z139 Encounter for screening, unspecified: Secondary | ICD-10-CM

## 2013-09-21 LAB — COMPREHENSIVE METABOLIC PANEL
ALBUMIN: 4.5 g/dL (ref 3.5–5.2)
ALT: 18 U/L (ref 0–35)
AST: 25 U/L (ref 0–37)
Alkaline Phosphatase: 82 U/L (ref 39–117)
BUN: 22 mg/dL (ref 6–23)
CALCIUM: 11.1 mg/dL — AB (ref 8.4–10.5)
CHLORIDE: 101 meq/L (ref 96–112)
CO2: 27 meq/L (ref 19–32)
CREATININE: 1.31 mg/dL — AB (ref 0.50–1.10)
Glucose, Bld: 104 mg/dL — ABNORMAL HIGH (ref 70–99)
POTASSIUM: 4.9 meq/L (ref 3.5–5.3)
Sodium: 138 mEq/L (ref 135–145)
Total Bilirubin: 0.5 mg/dL (ref 0.2–1.2)
Total Protein: 7.1 g/dL (ref 6.0–8.3)

## 2013-09-21 LAB — CBC WITH DIFFERENTIAL/PLATELET
BASOS ABS: 0.1 10*3/uL (ref 0.0–0.1)
Basophils Relative: 1 % (ref 0–1)
EOS PCT: 4 % (ref 0–5)
Eosinophils Absolute: 0.3 10*3/uL (ref 0.0–0.7)
HEMATOCRIT: 41.1 % (ref 36.0–46.0)
Hemoglobin: 14.4 g/dL (ref 12.0–15.0)
LYMPHS ABS: 2.5 10*3/uL (ref 0.7–4.0)
LYMPHS PCT: 35 % (ref 12–46)
MCH: 32.7 pg (ref 26.0–34.0)
MCHC: 35 g/dL (ref 30.0–36.0)
MCV: 93.2 fL (ref 78.0–100.0)
MONO ABS: 0.8 10*3/uL (ref 0.1–1.0)
Monocytes Relative: 12 % (ref 3–12)
NEUTROS ABS: 3.4 10*3/uL (ref 1.7–7.7)
Neutrophils Relative %: 48 % (ref 43–77)
Platelets: 406 10*3/uL — ABNORMAL HIGH (ref 150–400)
RBC: 4.41 MIL/uL (ref 3.87–5.11)
RDW: 12.7 % (ref 11.5–15.5)
WBC: 7 10*3/uL (ref 4.0–10.5)

## 2013-09-21 LAB — LIPID PANEL
CHOL/HDL RATIO: 3 ratio
CHOLESTEROL: 188 mg/dL (ref 0–200)
HDL: 63 mg/dL (ref >39)
LDL Cholesterol: 106 mg/dL — ABNORMAL HIGH (ref 0–99)
Triglycerides: 95 mg/dL (ref <150)
VLDL: 19 mg/dL (ref 0–40)

## 2013-09-21 LAB — TSH: TSH: 3.864 u[IU]/mL (ref 0.350–4.500)

## 2013-09-21 NOTE — Telephone Encounter (Signed)
Called Kloee and left message for her to call  Dr Corena Pilgrim office [phone] to reschedule her appt.

## 2013-09-21 NOTE — Progress Notes (Signed)
Subjective:    Patient ID: Marcia Adams, female    DOB: 1955-10-25, 58 y.o.   MRN: [id number]  HPI [name] is here for follow up of several issues.  It has been nearly a year since I have seen her  In interim she developed acute coloitis vs diverticulitis.  See Dr. Nichola Sizer note.   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  She is getting spironolactone for her skin from her dematologist  Hypothyrroidism she is taking her meds daily  Allergies  Allergen Reactions  . Aripiprazole Other (See Comments)    Excessive weight gain  . Escitalopram Oxalate   . Shrimp [Shellfish Allergy] Other (See Comments)    On allergy testing only  . Enalapril Rash   Past Medical History  Diagnosis Date  . Depression   . Hypertension   . GERD (gastroesophageal reflux disease)   . Hyperlipidemia   . Menopause   . Hypothyroidism   . Insomnia   . Anemia   . Anxiety   . Vitamin D deficiency   . Hiatal hernia   . Esophageal stricture   . Renal insufficiency   . Diverticulitis   . Kidney stones   . Allergic rhinitis    Past Surgical History  Procedure Laterality Date  . Nasal sinus surgery  7/99  . Nissen fundoplication  9/24  . Hemorrhoid surgery    . Mass excision  06/08/2011    Procedure: MINOR EXCISION OF MASS;  Surgeon: Cammie Sickle., MD;  Location: [facility];  Service: Orthopedics;  Laterality: Right;  excisional biopsy dorsal right hand  . Retinal detachment surgery      Gas bubble in center of right eye.  Pt not to lye completely flat.   History   Social History  . Marital Status: Divorced    Spouse Name: N/A    Number of Children: 2  . Years of Education: N/A   Occupational History  . Program [employer]   Social History Main Topics  . Smoking status: Never Smoker   . Smokeless tobacco: Never  Used  . Alcohol Use: Yes     Comment: occasionally  . Drug Use: No  . Sexual Activity: No   Other Topics Concern  . Not on file   Social History Narrative  . No narrative on file   Family History  Problem Relation Age of Onset  . COPD Mother   . COPD Father   . Urolithiasis Brother   . Arthritis Maternal Grandmother   . Depression Brother   . Kidney cancer Maternal Grandfather   . Heart failure Father   . Heart failure Mother   . Eczema Daughter    Patient Active Problem List   Diagnosis Date Noted  . Diarrhea 09/30/2012  . Retinal detachment 09/30/2012  . Diverticulitis 09/25/2012  . Abdominal pain 09/25/2012  . Nausea vomiting and diarrhea 09/25/2012  . ARF (acute renal failure) 09/25/2012  . Hypercalcemia 09/17/2012  . 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 Postmenopausal atrophic vaginitis 05/26/2011  . Herpes 04/24/2011  . Anemia   . Anxiety   . Acne vulgaris 04/21/2008  . Keloid 10/22/2007  . Hypothyroidism 04/23/2007  . Carpal tunnel syndrome 10/22/2006  . Benign neoplasm of skin 02/27/2006  . Pain, neck 02/27/2006  . Paresthesia 02/27/2006  .  Reflux esophagitis 02/20/2005  . Obesity 10/04/2004  . Heat exhaustion 10/04/2004  . Depression 11/10/2003  . Allergic rhinitis, cause unspecified 11/10/2003  . Calculus of ureter 11/10/2003  . Insomnia 11/10/2003  . Disorder of bone and cartilage, unspecified 03/24/2003  . Plantar fasciitis 03/04/2002  . Hyperlipidemia 01/14/2002  . Fatigue 12/03/2001  . Pain in the chest 12/03/2001  . Pain, abdominal, epigastric 12/03/2001  . UTI (urinary tract infection) 03/25/2000  . Hemorrhoids, external 03/02/1999  . HTN (hypertension) 01/24/1999  . Allergic rhinitis due to dust 01/24/1999   Current Outpatient Prescriptions on File Prior to Visit  Medication Sig Dispense Refill  . acyclovir (ZOVIRAX) 400 MG tablet Take 1 tablet (400 mg total) by mouth daily. Take one tablet once daily to prevent infection.  90 tablet  3  .  buPROPion (WELLBUTRIN XL) 150 MG 24 hr tablet Take 450 mg by mouth daily.       . calcium-vitamin D (OSCAL WITH D) 500-200 MG-UNIT per tablet Take 0.5 tablets by mouth daily.       Marland Kitchen DYMISTA 137-50 MCG/ACT SUSP Place 1 spray into the nose Twice daily. Each nostril      . EPIPEN 2-PAK 0.3 MG/0.3ML DEVI       . Eszopiclone 3 MG TABS Take 3 mg by mouth at bedtime. Take immediately before bedtime      . levothyroxine (SYNTHROID, LEVOTHROID) 75 MCG tablet Take 1 tablet (75 mcg total) by mouth daily. Take one tablet once daily for thyroid supplement.  90 tablet  3  . loratadine (CLARITIN) 10 MG tablet Take one tablet once daily as needed for allergies.       Marland Kitchen LORazepam (ATIVAN) 1 MG tablet 3 mg. Take two tablets nightly for sleep      . Probiotic Product (PROBIOTIC FORMULA PO) Take 1 capsule by mouth daily.      . Psyllium (METAMUCIL PO) Take 1 capsule by mouth 2 (two) times daily.      . simvastatin (ZOCOR) 10 MG tablet Take one at hs  90 tablet  0  . tazarotene (AVAGE) 0.1 % cream Apply once nightly for acne       . Vortioxetine HBr (BRINTELLIX) 5 MG TABS Take 20 mg by mouth daily.       Marland Kitchen MOVIPREP 100 G SOLR Take 1 kit (200 g total) by mouth once.  1 kit  0  . potassium chloride (K-DUR,KLOR-CON) 10 MEQ tablet Take 10 mEq by mouth daily.      . [DISCONTINUED] azelastine (ASTELIN) 137 MCG/SPRAY nasal spray One spray in each nostril twice daily as needed for allergies.       . [DISCONTINUED] cloNIDine (CATAPRES) 0.1 MG tablet Take one tablet up to three times daily as needed to reduce excessive sweating.       . [DISCONTINUED] mirtazapine (REMERON) 15 MG tablet Take one tablet once daily at bedtime        No current facility-administered medications on file prior to visit.       Review of Systems See HPI    Objective:   Physical Exam   Physical Exam  Nursing note and vitals reviewed.  Constitutional: She is oriented to person, place, and time. She appears well-developed and  well-nourished.  HENT:  Head: Normocephalic and atraumatic.  Cardiovascular: Normal rate and regular rhythm. Exam reveals no gallop and no friction rub.  No murmur heard.  Pulmonary/Chest: Breath sounds normal. She has no wheezes. She has no rales.  Neurological: She is  alert and oriented to person, place, and time.  Skin: Skin is warm and dry.  Psychiatric: She has a normal mood and affect. Her behavior is normal.        Assessment & Plan:  Hypothyroidism  Will check today with all labs  HPV pos  I had referred pt to Dr. Lynnette Caffey and I advised her to reschedule her appt and counseled complications of untreated high risk HPV that is ongoing including risk for cervical cancer.  She voices understanding  Colitis  Advised to see Dr. Olevia Perches as she had advised colonoscopy    Pt walked out of office without stopping to see registrar  Nira Conn will call pt to give her GYN number for pt to reschedule her appt

## 2013-09-22 LAB — VITAMIN D 25 HYDROXY (VIT D DEFICIENCY, FRACTURES): VIT D 25 HYDROXY: 69 ng/mL (ref 30–89)

## 2013-09-24 ENCOUNTER — Telehealth: Payer: Self-pay | Admitting: Internal Medicine

## 2013-09-24 NOTE — Telephone Encounter (Signed)
Left message for pt to call regarding lab results   Informed that I am not in office on Friday and pt to call Monday am to discuss test results

## 2013-09-27 ENCOUNTER — Other Ambulatory Visit: Payer: Self-pay | Admitting: Internal Medicine

## 2013-09-28 NOTE — Telephone Encounter (Signed)
Requested Medications     Medication name:  Name from pharmacy:  levothyroxine (SYNTHROID, LEVOTHROID) 75 MCG tablet  LEVOTHYROXINE 0.075MG  (75MCG) TABS    Sig: TAKE 1 TABLET BY MOUTH EVERY DAY FOR THYROID SUPPLEMENT    Dispense: 90 tablet Refills: 0 Start: 09/27/2013  Class: Normal    Requested on: 09/27/2013    Originally ordered on: 06/15/2010 Last refill: 07/05/2013 Order History and Details           Medication name:  Name from pharmacy:  simvastatin (ZOCOR) 10 MG tablet  SIMVASTATIN 10MG  TABLETS    Sig: TAKE 1 TABLET BY MOUTH EVERY NIGHT AT BEDTIME    Dispense: 90 tablet Refills: 0 Start: 09/27/2013  Class: Normal    Requested on: 09/27/2013    Originally ordered on: 05/16/2011 Last refill: 07/05/2013 Order History and Details

## 2013-10-06 ENCOUNTER — Other Ambulatory Visit: Payer: Self-pay | Admitting: Obstetrics & Gynecology

## 2013-10-07 LAB — CYTOLOGY - PAP

## 2013-10-11 ENCOUNTER — Other Ambulatory Visit: Payer: Self-pay | Admitting: Internal Medicine

## 2013-10-13 NOTE — Telephone Encounter (Signed)
Marcia Adams returned you call. Please call hall her back   She also needs a refill of acyclovir .

## 2013-10-13 NOTE — Telephone Encounter (Signed)
Refill request

## 2013-10-14 ENCOUNTER — Telehealth: Payer: Self-pay | Admitting: Internal Medicine

## 2013-10-14 NOTE — Telephone Encounter (Signed)
Spoke with pt. And informed of calicium results.   She is on oral calcium supplements   Will hold calcium and recheck in two months .  She is advised to see me in office in 2 months    She has seen Dr. Lynnette Caffey.  GYN for HPV.   I do not have note as yet  .  Advised to be sure to keep follow up with her.

## 2013-10-19 ENCOUNTER — Encounter: Payer: Self-pay | Admitting: Internal Medicine

## 2013-10-19 DIAGNOSIS — IMO0002 Reserved for concepts with insufficient information to code with codable children: Secondary | ICD-10-CM | POA: Insufficient documentation

## 2013-10-28 ENCOUNTER — Encounter: Payer: Self-pay | Admitting: *Deleted

## 2013-11-16 ENCOUNTER — Other Ambulatory Visit: Payer: Self-pay | Admitting: Obstetrics & Gynecology

## 2013-11-20 ENCOUNTER — Other Ambulatory Visit: Payer: Self-pay

## 2013-11-22 ENCOUNTER — Emergency Department (HOSPITAL_COMMUNITY): Payer: BC Managed Care – PPO

## 2013-11-22 ENCOUNTER — Encounter (HOSPITAL_COMMUNITY): Payer: Self-pay | Admitting: Emergency Medicine

## 2013-11-22 ENCOUNTER — Emergency Department (HOSPITAL_COMMUNITY)
Admission: EM | Admit: 2013-11-22 | Discharge: 2013-11-23 | Disposition: A | Discharge status: Home / Self Care | Payer: BC Managed Care – PPO | Attending: Emergency Medicine | Admitting: Emergency Medicine

## 2013-11-22 DIAGNOSIS — F329 Major depressive disorder, single episode, unspecified: Secondary | ICD-10-CM | POA: Diagnosis not present

## 2013-11-22 DIAGNOSIS — R109 Unspecified abdominal pain: Secondary | ICD-10-CM | POA: Diagnosis present

## 2013-11-22 DIAGNOSIS — R112 Nausea with vomiting, unspecified: Secondary | ICD-10-CM | POA: Insufficient documentation

## 2013-11-22 DIAGNOSIS — R197 Diarrhea, unspecified: Secondary | ICD-10-CM | POA: Insufficient documentation

## 2013-11-22 DIAGNOSIS — Z79899 Other long term (current) drug therapy: Secondary | ICD-10-CM | POA: Diagnosis not present

## 2013-11-22 DIAGNOSIS — I10 Essential (primary) hypertension: Secondary | ICD-10-CM | POA: Insufficient documentation

## 2013-11-22 DIAGNOSIS — Z862 Personal history of diseases of the blood and blood-forming organs and certain disorders involving the immune mechanism: Secondary | ICD-10-CM | POA: Diagnosis not present

## 2013-11-22 DIAGNOSIS — F419 Anxiety disorder, unspecified: Secondary | ICD-10-CM | POA: Insufficient documentation

## 2013-11-22 DIAGNOSIS — Z87442 Personal history of urinary calculi: Secondary | ICD-10-CM | POA: Insufficient documentation

## 2013-11-22 DIAGNOSIS — Z8719 Personal history of other diseases of the digestive system: Secondary | ICD-10-CM | POA: Diagnosis not present

## 2013-11-22 DIAGNOSIS — E039 Hypothyroidism, unspecified: Secondary | ICD-10-CM | POA: Insufficient documentation

## 2013-11-22 DIAGNOSIS — K5289 Other specified noninfective gastroenteritis and colitis: Secondary | ICD-10-CM | POA: Diagnosis not present

## 2013-11-22 DIAGNOSIS — E785 Hyperlipidemia, unspecified: Secondary | ICD-10-CM | POA: Diagnosis not present

## 2013-11-22 DIAGNOSIS — K529 Noninfective gastroenteritis and colitis, unspecified: Secondary | ICD-10-CM

## 2013-11-22 LAB — CBC WITH DIFFERENTIAL/PLATELET
Basophils Absolute: 0 10*3/uL (ref 0.0–0.1)
Basophils Relative: 0 % (ref 0–1)
EOS ABS: 0 10*3/uL (ref 0.0–0.7)
Eosinophils Relative: 0 % (ref 0–5)
HCT: 41 % (ref 36.0–46.0)
HEMOGLOBIN: 14.5 g/dL (ref 12.0–15.0)
LYMPHS ABS: 0.5 10*3/uL — AB (ref 0.7–4.0)
LYMPHS PCT: 3 % — AB (ref 12–46)
MCH: 33.3 pg (ref 26.0–34.0)
MCHC: 35.4 g/dL (ref 30.0–36.0)
MCV: 94 fL (ref 78.0–100.0)
MONOS PCT: 11 % (ref 3–12)
Monocytes Absolute: 1.7 10*3/uL — ABNORMAL HIGH (ref 0.1–1.0)
NEUTROS ABS: 13.9 10*3/uL — AB (ref 1.7–7.7)
NEUTROS PCT: 86 % — AB (ref 43–77)
PLATELETS: 213 10*3/uL (ref 150–400)
RBC: 4.36 MIL/uL (ref 3.87–5.11)
RDW: 11.8 % (ref 11.5–15.5)
WBC: 16.1 10*3/uL — AB (ref 4.0–10.5)

## 2013-11-22 LAB — COMPREHENSIVE METABOLIC PANEL
ALK PHOS: 66 U/L (ref 39–117)
ALT: 16 U/L (ref 0–35)
AST: 24 U/L (ref 0–37)
Albumin: 4 g/dL (ref 3.5–5.2)
Anion gap: 18 — ABNORMAL HIGH (ref 5–15)
BILIRUBIN TOTAL: 0.5 mg/dL (ref 0.3–1.2)
BUN: 18 mg/dL (ref 6–23)
CHLORIDE: 104 meq/L (ref 96–112)
CO2: 20 meq/L (ref 19–32)
Calcium: 9.7 mg/dL (ref 8.4–10.5)
Creatinine, Ser: 0.99 mg/dL (ref 0.50–1.10)
GFR calc non Af Amer: 61 mL/min — ABNORMAL LOW (ref >90)
GFR, EST AFRICAN AMERICAN: 70 mL/min — AB (ref >90)
GLUCOSE: 155 mg/dL — AB (ref 70–99)
POTASSIUM: 4.3 meq/L (ref 3.7–5.3)
SODIUM: 142 meq/L (ref 137–147)
TOTAL PROTEIN: 6.8 g/dL (ref 6.0–8.3)

## 2013-11-22 LAB — LIPASE, BLOOD: LIPASE: 10 U/L — AB (ref 11–59)

## 2013-11-22 LAB — I-STAT CG4 LACTIC ACID, ED: Lactic Acid, Venous: 1.56 mmol/L (ref 0.5–2.2)

## 2013-11-22 MED ORDER — SODIUM CHLORIDE 0.9 % IV BOLUS (SEPSIS)
1000.0000 mL | Freq: Once | INTRAVENOUS | Status: AC
  Administered 2013-11-22: 1000 mL via INTRAVENOUS

## 2013-11-22 MED ORDER — ONDANSETRON HCL 4 MG/2ML IJ SOLN
4.0000 mg | Freq: Once | INTRAMUSCULAR | Status: AC
  Administered 2013-11-22: 4 mg via INTRAVENOUS
  Filled 2013-11-22: qty 2

## 2013-11-22 MED ORDER — MORPHINE SULFATE 4 MG/ML IJ SOLN
4.0000 mg | Freq: Once | INTRAMUSCULAR | Status: AC
  Administered 2013-11-22: 4 mg via INTRAVENOUS
  Filled 2013-11-22: qty 1

## 2013-11-22 MED ORDER — IOHEXOL 300 MG/ML  SOLN
100.0000 mL | Freq: Once | INTRAMUSCULAR | Status: AC | PRN
  Administered 2013-11-22: 100 mL via INTRAVENOUS

## 2013-11-22 MED ORDER — CIPROFLOXACIN IN D5W 400 MG/200ML IV SOLN
400.0000 mg | Freq: Once | INTRAVENOUS | Status: AC
  Administered 2013-11-23: 400 mg via INTRAVENOUS
  Filled 2013-11-22: qty 200

## 2013-11-22 MED ORDER — METRONIDAZOLE IN NACL 5-0.79 MG/ML-% IV SOLN
500.0000 mg | Freq: Once | INTRAVENOUS | Status: AC
  Administered 2013-11-22: 500 mg via INTRAVENOUS
  Filled 2013-11-22: qty 100

## 2013-11-22 NOTE — ED Notes (Signed)
Per EMS: pt from home for eval of bilateral flank pain that radiates to back that started today, pt states vomiting since 1345 today, denies any fevers. Pt states she was seen for same symptoms before and was found to be severely dehydrated. Pt denies any urinary symptoms. EN route, pt given 500 cc bolus, 50 mcg of fentanyl with pain relief and 4 mg of zofran. Pt axo x4, nad noted.

## 2013-11-22 NOTE — Progress Notes (Signed)
   Subjective:    Patient ID: Marcia Adams, female    DOB: 1955-09-08, 58 y.o.   MRN: [id number]  HPI 09/2013 Hypothyroidism Will check today with all labs  HPV pos I had referred pt to Dr. Lynnette Caffey and I advised her to reschedule her appt and counseled complications of untreated high risk HPV that is ongoing including risk for cervical cancer. She voices understanding  Colitis Advised to see Dr. Olevia Perches as she had advised colonoscopy  Pt walked out of office without stopping to see registrar Nira Conn will call pt to give her GYN number for pt to reschedule her appt      Today   Review of Systems See HPI    Objective:   Physical Exam  Physical Exam  Nursing note and vitals reviewed.  Constitutional: She is oriented to person, place, and time. She appears well-developed and well-nourished.  HENT:  Head: Normocephalic and atraumatic.  Cardiovascular: Normal rate and regular rhythm. Exam reveals no gallop and no friction rub.  No murmur heard.  Pulmonary/Chest: Breath sounds normal. She has no wheezes. She has no rales.  Neurological: She is alert and oriented to person, place, and time.  Skin: Skin is warm and dry.  Psychiatric: She has a normal mood and affect. Her behavior is normal.             Assessment & Plan:

## 2013-11-22 NOTE — ED Notes (Signed)
Pt alert, NAD, calm, interactive, resps e/u, no dyspnea noted, VSS, "feel better than before when I arrived". Pending urine sample and CT scan. Blood sent to lab, IVF w.o. to gravity.

## 2013-11-22 NOTE — ED Provider Notes (Signed)
CSN: [id number]     Arrival date & time 11/22/13  2045 History   First MD Initiated Contact with Patient 11/22/13 2046     Chief Complaint  Patient presents with  . Flank Pain  . Emesis     (Consider location/radiation/quality/duration/timing/severity/associated sxs/prior Treatment) HPI Comments: Patient presents to the emergency department with chief complaints of abdominal pain, nausea, vomiting, and diarrhea. She states that the symptoms started today around 2:00. She states that the pain is severe. She denies any associated fevers, chills, chest pain, shortness of breath, dysuria, or vaginal discharge. She has not tried taking anything to alleviate her symptoms. She states that she feels dehydrated. The symptoms are aggravated with palpation and with vomiting. She denies past abdominal surgical history.  The history is provided by the patient. No language interpreter was used.    Past Medical History  Diagnosis Date  . Depression   . Hypertension   . GERD (gastroesophageal reflux disease)   . Hyperlipidemia   . Menopause   . Hypothyroidism   . Insomnia   . Anemia   . Anxiety   . Vitamin D deficiency   . Hiatal hernia   . Esophageal stricture   . Renal insufficiency   . Diverticulitis   . Kidney stones   . Allergic rhinitis    Past Surgical History  Procedure Laterality Date  . Nasal sinus surgery  7/99  . Nissen fundoplication  3/84  . Hemorrhoid surgery    . Mass excision  06/08/2011    Procedure: MINOR EXCISION OF MASS;  Surgeon: Cammie Sickle., MD;  Location: [facility];  Service: Orthopedics;  Laterality: Right;  excisional biopsy dorsal right hand  . Retinal detachment surgery      Gas bubble in center of right eye.  Pt not to lye completely flat.   Family History  Problem Relation Age of Onset  . COPD Mother   . COPD Father   . Urolithiasis Brother   . Arthritis Maternal Grandmother   . Depression Brother   . Kidney cancer Maternal  Grandfather   . Heart failure Father   . Heart failure Mother   . Eczema Daughter    History  Substance Use Topics  . Smoking status: Never Smoker   . Smokeless tobacco: Never Used  . Alcohol Use: Yes     Comment: occasionally   OB History   Grav Para Term Preterm Abortions TAB SAB Ect Mult Living   2 2             Review of Systems  Constitutional: Negative for fever and chills.  Respiratory: Negative for shortness of breath.   Cardiovascular: Negative for chest pain.  Gastrointestinal: Negative for nausea, vomiting, diarrhea and constipation.  Genitourinary: Negative for dysuria.  All other systems reviewed and are negative.     Allergies  Aripiprazole; Escitalopram oxalate; Shrimp; and Enalapril  Home Medications   Prior to Admission medications   Medication Sig Start Date End Date Taking? Authorizing Provider  acyclovir (ZOVIRAX) 400 MG tablet TAKE 1 TABLET BY MOUTH EVERY DAY TO PREVENT INFECTION 10/14/13   Lanice Shirts, MD  buPROPion (WELLBUTRIN XL) 150 MG 24 hr tablet Take 450 mg by mouth daily.     Historical Provider, MD  calcium-vitamin D (OSCAL WITH D) 500-200 MG-UNIT per tablet Take 0.5 tablets by mouth daily.     Historical Provider, MD  DYMISTA 137-50 MCG/ACT SUSP Place 1 spray into the nose Twice daily. Each  nostril 04/02/11   Historical Provider, MD  EPIPEN 2-PAK 0.3 MG/0.3ML DEVI  06/02/11   Historical Provider, MD  Eszopiclone 3 MG TABS Take 3 mg by mouth at bedtime. Take immediately before bedtime    Historical Provider, MD  levothyroxine (SYNTHROID, LEVOTHROID) 75 MCG tablet TAKE 1 TABLET BY MOUTH EVERY DAY FOR THYROID SUPPLEMENT 09/28/13   Lanice Shirts, MD  loratadine (CLARITIN) 10 MG tablet Take one tablet once daily as needed for allergies.     Historical Provider, MD  LORazepam (ATIVAN) 1 MG tablet 3 mg. Take two tablets nightly for sleep    Historical Provider, MD  MOVIPREP 100 G SOLR Take 1 kit (200 g total) by mouth once. 10/10/12   Lafayette Dragon, MD  potassium chloride (K-DUR,KLOR-CON) 10 MEQ tablet Take 10 mEq by mouth daily.    Historical Provider, MD  Probiotic Product (PROBIOTIC FORMULA PO) Take 1 capsule by mouth daily.    Historical Provider, MD  Psyllium (METAMUCIL PO) Take 1 capsule by mouth 2 (two) times daily.    Historical Provider, MD  simvastatin (ZOCOR) 10 MG tablet TAKE 1 TABLET BY MOUTH EVERY NIGHT AT BEDTIME 09/28/13   Lanice Shirts, MD  spironolactone (ALDACTONE) 25 MG tablet Take 25 mg by mouth daily.    Historical Provider, MD  tazarotene (AVAGE) 0.1 % cream Apply once nightly for acne     Historical Provider, MD  Vortioxetine HBr (BRINTELLIX) 5 MG TABS Take 20 mg by mouth daily.     Historical Provider, MD   BP 134/78  Pulse 75  Temp(Src) 98.7 F (37.1 C) (Oral)  Resp 16  Ht _0  (1.651 m)  Wt 153 lb (69.4 kg)  BMI 25.46 kg/m2  SpO2 100% Physical Exam  Nursing note and vitals reviewed. Constitutional: She is oriented to person, place, and time. She appears well-developed and well-nourished.  HENT:  Head: Normocephalic and atraumatic.  Eyes: Conjunctivae and EOM are normal. Pupils are equal, round, and reactive to light.  Neck: Normal range of motion. Neck supple.  Cardiovascular: Normal rate and regular rhythm.  Exam reveals no gallop and no friction rub.   No murmur heard. Pulmonary/Chest: Effort normal and breath sounds normal. No respiratory distress. She has no wheezes. She has no rales. She exhibits no tenderness.  Abdominal: Soft. Bowel sounds are normal. She exhibits no distension and no mass. There is tenderness. There is no rebound and no guarding.  Abdomen is diffusely tender to palpation, but not distended, or rigid, no focal abdominal tenderness  Musculoskeletal: Normal range of motion. She exhibits no edema and no tenderness.  Neurological: She is alert and oriented to person, place, and time.  Skin: Skin is warm and dry.  Psychiatric: She has a normal mood and affect. Her  behavior is normal. Judgment and thought content normal.    ED Course  Procedures (including critical care time) Results for orders placed during the hospital encounter of 11/22/13  CBC WITH DIFFERENTIAL      Result Value Ref Range   WBC 16.1 (*) 4.0 - 10.5 K/uL   RBC 4.36  3.87 - 5.11 MIL/uL   Hemoglobin 14.5  12.0 - 15.0 g/dL   HCT 41.0  36.0 - 46.0 %   MCV 94.0  78.0 - 100.0 fL   MCH 33.3  26.0 - 34.0 pg   MCHC 35.4  30.0 - 36.0 g/dL   RDW 11.8  11.5 - 15.5 %   Platelets 213  150 - 400  K/uL   Neutrophils Relative % 86 (*) 43 - 77 %   Neutro Abs 13.9 (*) 1.7 - 7.7 K/uL   Lymphocytes Relative 3 (*) 12 - 46 %   Lymphs Abs 0.5 (*) 0.7 - 4.0 K/uL   Monocytes Relative 11  3 - 12 %   Monocytes Absolute 1.7 (*) 0.1 - 1.0 K/uL   Eosinophils Relative 0  0 - 5 %   Eosinophils Absolute 0.0  0.0 - 0.7 K/uL   Basophils Relative 0  0 - 1 %   Basophils Absolute 0.0  0.0 - 0.1 K/uL  COMPREHENSIVE METABOLIC PANEL      Result Value Ref Range   Sodium 142  137 - 147 mEq/L   Potassium 4.3  3.7 - 5.3 mEq/L   Chloride 104  96 - 112 mEq/L   CO2 20  19 - 32 mEq/L   Glucose, Bld 155 (*) 70 - 99 mg/dL   BUN 18  6 - 23 mg/dL   Creatinine, Ser 0.99  0.50 - 1.10 mg/dL   Calcium 9.7  8.4 - 10.5 mg/dL   Total Protein 6.8  6.0 - 8.3 g/dL   Albumin 4.0  3.5 - 5.2 g/dL   AST 24  0 - 37 U/L   ALT 16  0 - 35 U/L   Alkaline Phosphatase 66  39 - 117 U/L   Total Bilirubin 0.5  0.3 - 1.2 mg/dL   GFR calc non Af Amer 61 (*) >90 mL/min   GFR calc Af Amer 70 (*) >90 mL/min   Anion gap 18 (*) 5 - 15  LIPASE, BLOOD      Result Value Ref Range   Lipase 10 (*) 11 - 59 U/L  URINALYSIS, ROUTINE W REFLEX MICROSCOPIC      Result Value Ref Range   Color, Urine AMBER (*) YELLOW   APPearance CLEAR  CLEAR   Specific Gravity, Urine <1.005 (*) 1.005 - 1.030   pH 5.0  5.0 - 8.0   Glucose, UA NEGATIVE  NEGATIVE mg/dL   Hgb urine dipstick NEGATIVE  NEGATIVE   Bilirubin Urine SMALL (*) NEGATIVE   Ketones, ur 15 (*)  NEGATIVE mg/dL   Protein, ur NEGATIVE  NEGATIVE mg/dL   Urobilinogen, UA 1.0  0.0 - 1.0 mg/dL   Nitrite NEGATIVE  NEGATIVE   Leukocytes, UA NEGATIVE  NEGATIVE  I-STAT CG4 LACTIC ACID, ED      Result Value Ref Range   Lactic Acid, Venous 1.56  0.5 - 2.2 mmol/L   Ct Abdomen Pelvis W Contrast  11/22/2013   CLINICAL DATA:  58 year old female with acute bilateral flank pain, nausea vomiting diarrhea. Initial encounter. Personal history of Nissen fundoplication.  EXAM: CT ABDOMEN AND PELVIS WITH CONTRAST  TECHNIQUE: Multidetector CT imaging of the abdomen and pelvis was performed using the standard protocol following bolus administration of intravenous contrast.  CONTRAST:  167m OMNIPAQUE IOHEXOL 300 MG/ML  SOLN  COMPARISON:  CT Abdomen and Pelvis 22,014 and earlier.  FINDINGS: Chronic elevation of the right hemidiaphragm is similar. Minor lung base atelectasis. No pericardial or pleural effusion.  No acute osseous abnormality identified.  Decompressed rectum. Uterus and adnexa appear within normal limits. Decompressed bladder.  Thick walled and inflamed sigmoid colon along a segment of about 20 cm where there are numerous diverticula. Small volume free fluid surrounding the abnormal colon. Mesenteric stranding. No organized fluid collection or extraluminal gas.  The colonic wall thickening in inflammation and continues into the left colon which  is redundant. A continues through the splenic flexure, and abates in the distal transverse colon. Mesenteric stranding noted throughout the left colon. Only occasional diverticula in that segment.  Proximal transverse colon is within normal limits. Redundant hepatic flexure with moderate to severe diverticulosis but no active inflammation. Cecum and appendix within normal limits. No dilated small bowel.  The stomach is distended. There is a chronic moderate gastric hernia. Duodenum is mildly distended.  Interval resolved perihepatic free fluid. Stable mildly distended  gallbladder. Liver, spleen, pancreas, adrenal glands, and kidneys are stable and within normal limits for age. Portal venous system within normal limits. Major arterial structures in the abdomen and pelvis are patent. Mild Aortoiliac calcified atherosclerosis noted. No lymphadenopathy identified.  IMPRESSION: 1. Acute long segment colitis from the splenic flexure through the sigmoid colon. This may have started as an acute diverticulitis of the sigmoid colon, but there are few if any diverticula in the more proximally affected segment. Mesenteric inflammation and small volume free fluid with no abscess or free air. 2. Diverticulosis in the right colon without active inflammation. 3. No other acute findings in the abdomen or pelvis.   Electronically Signed   By: Lars Pinks M.D.   On: 11/22/2013 22:20     Imaging Review No results found.   EKG Interpretation None      MDM   Final diagnoses:  Colitis    Patient with moderate to severe abdominal pain, with associated vomiting and diarrhea. No past abdominal surgical history. Will check basic labs, treat pain, and will reassess.  CT remarkable for colitis.  Patient seen by and discussed with Dr. Wyvonnia Dusky.  Will treat with cipro and flagyl.    Patient feels improved, will discharge to home with cipro and flagyl.  She is tolerating oral intake. Will give pain meds and nausea meds.      Montine Circle, PA-C 11/23/13 [phone]

## 2013-11-23 ENCOUNTER — Telehealth: Payer: Self-pay | Admitting: Internal Medicine

## 2013-11-23 ENCOUNTER — Encounter: Payer: BC Managed Care – PPO | Admitting: Internal Medicine

## 2013-11-23 LAB — URINALYSIS, ROUTINE W REFLEX MICROSCOPIC
Glucose, UA: NEGATIVE mg/dL
HGB URINE DIPSTICK: NEGATIVE
Ketones, ur: 15 mg/dL — AB
Leukocytes, UA: NEGATIVE
NITRITE: NEGATIVE
Protein, ur: NEGATIVE mg/dL
Urobilinogen, UA: 1 mg/dL (ref 0.0–1.0)
pH: 5 (ref 5.0–8.0)

## 2013-11-23 MED ORDER — CIPROFLOXACIN HCL 500 MG PO TABS: 500.0000 mg | ORAL_TABLET | Freq: Two times a day (BID) | ORAL | Status: DC

## 2013-11-23 MED ORDER — ONDANSETRON HCL 4 MG PO TABS: 4.0000 mg | ORAL_TABLET | Freq: Four times a day (QID) | ORAL | Status: DC

## 2013-11-23 MED ORDER — METRONIDAZOLE 500 MG PO TABS: 500.0000 mg | ORAL_TABLET | Freq: Two times a day (BID) | ORAL | Status: DC

## 2013-11-23 MED ORDER — HYDROCODONE-ACETAMINOPHEN 5-325 MG PO TABS: 1.0000 | ORAL_TABLET | Freq: Four times a day (QID) | ORAL | Status: DC | PRN

## 2013-11-23 NOTE — ED Provider Notes (Signed)
Medical screening examination/treatment/procedure(s) were conducted as a shared visit with non-physician practitioner(s) and myself.  I personally evaluated the patient during the encounter.  Lower abdominal pain with diarrhea, nausea, vomiting since 1 pm.  No fever. Abdomen soft without peritoneal signs.  Appears well. Vitals stable, moist mucus membranes. IV antibiotics here.  Possible candidate for home treatment if symptoms controlled.   EKG Interpretation None       Ezequiel Essex, MD 11/23/13 [phone]

## 2013-11-23 NOTE — Telephone Encounter (Signed)
Marcia Adams  Call pt and schedule her today or tomorrow for ER follow up..  Leave 30 min appt   Route back with appt date    Thanks

## 2013-11-23 NOTE — Discharge Instructions (Signed)

## 2013-11-23 NOTE — ED Notes (Signed)
Pt A&OX4, ambulatory at d/c with steady gait, NAD 

## 2013-11-24 NOTE — Telephone Encounter (Signed)
Guida will call me back to schedule appointment, she got home at 4 4 am this morning and was getting ready to go get her medicine.

## 2013-11-24 NOTE — Telephone Encounter (Signed)
Talked with Marcia Adams and scheduled her to come in next Wednesday at 2:45, she did not want to try to come in this week.

## 2013-12-02 ENCOUNTER — Ambulatory Visit (INDEPENDENT_AMBULATORY_CARE_PROVIDER_SITE_OTHER): Payer: BC Managed Care – PPO | Admitting: Internal Medicine

## 2013-12-02 ENCOUNTER — Encounter: Payer: Self-pay | Admitting: Internal Medicine

## 2013-12-02 VITALS — BP 143/91 | HR 86 | Temp 98.8°F | Resp 16 | Ht 64.0 in | Wt 158.0 lb

## 2013-12-02 DIAGNOSIS — Z1211 Encounter for screening for malignant neoplasm of colon: Secondary | ICD-10-CM | POA: Diagnosis not present

## 2013-12-02 DIAGNOSIS — K5732 Diverticulitis of large intestine without perforation or abscess without bleeding: Secondary | ICD-10-CM | POA: Diagnosis not present

## 2013-12-02 LAB — COMPLETE METABOLIC PANEL WITH GFR
ALK PHOS: 53 U/L (ref 39–117)
ALT: 40 U/L — ABNORMAL HIGH (ref 0–35)
AST: 47 U/L — ABNORMAL HIGH (ref 0–37)
Albumin: 4 g/dL (ref 3.5–5.2)
BILIRUBIN TOTAL: 0.5 mg/dL (ref 0.2–1.2)
BUN: 17 mg/dL (ref 6–23)
CO2: 24 mEq/L (ref 19–32)
Calcium: 9.7 mg/dL (ref 8.4–10.5)
Chloride: 105 mEq/L (ref 96–112)
Creat: 0.94 mg/dL (ref 0.50–1.10)
GFR, Est African American: 76 mL/min
GFR, Est Non African American: 66 mL/min
Glucose, Bld: 81 mg/dL (ref 70–99)
Potassium: 4.1 mEq/L (ref 3.5–5.3)
Sodium: 140 mEq/L (ref 135–145)
Total Protein: 6.3 g/dL (ref 6.0–8.3)

## 2013-12-02 LAB — CBC WITH DIFFERENTIAL/PLATELET
BASOS PCT: 1 % (ref 0–1)
Basophils Absolute: 0.1 10*3/uL (ref 0.0–0.1)
EOS ABS: 0.2 10*3/uL (ref 0.0–0.7)
Eosinophils Relative: 3 % (ref 0–5)
HEMATOCRIT: 39 % (ref 36.0–46.0)
HEMOGLOBIN: 13.1 g/dL (ref 12.0–15.0)
Lymphocytes Relative: 35 % (ref 12–46)
Lymphs Abs: 2.8 10*3/uL (ref 0.7–4.0)
MCH: 32.8 pg (ref 26.0–34.0)
MCHC: 33.6 g/dL (ref 30.0–36.0)
MCV: 97.5 fL (ref 78.0–100.0)
MONO ABS: 0.8 10*3/uL (ref 0.1–1.0)
MONOS PCT: 10 % (ref 3–12)
NEUTROS PCT: 51 % (ref 43–77)
Neutro Abs: 4.1 10*3/uL (ref 1.7–7.7)
Platelets: 343 10*3/uL (ref 150–400)
RBC: 4 MIL/uL (ref 3.87–5.11)
RDW: 12.8 % (ref 11.5–15.5)
WBC: 8 10*3/uL (ref 4.0–10.5)

## 2013-12-02 LAB — HEMOCCULT GUIAC POC 1CARD (OFFICE): FECAL OCCULT BLD: NEGATIVE

## 2013-12-02 MED ORDER — CIPROFLOXACIN HCL 750 MG PO TABS: 750.0000 mg | ORAL_TABLET | Freq: Two times a day (BID) | ORAL | Status: DC

## 2013-12-02 MED ORDER — METRONIDAZOLE 500 MG PO TABS: 500.0000 mg | ORAL_TABLET | Freq: Three times a day (TID) | ORAL | Status: DC

## 2013-12-02 NOTE — Progress Notes (Signed)
Subjective:    Patient ID: Marcia Adams, female    DOB: 19-Feb-1955, 58 y.o.   MRN: [id number]  HPI 11/22/2013  ER note Patient with moderate to severe abdominal pain, with associated vomiting and diarrhea. No past abdominal surgical history. Will check basic labs, treat pain, and will reassess.  CT remarkable for colitis. Patient seen by and discussed with Dr. Wyvonnia Dusky. Will treat with cipro and flagyl.  Patient feels improved, will discharge to home with cipro and flagyl. She is tolerating oral intake. Will give pain meds and nausea meds.   Today   Marcia Adams is here for ER follow up  Dx 10/18 with colitis likely secondary to diverticulitis.  Treated with Cipro and Flagyl  For 7 days   Wbc 16K.  Feeling much better no pain no vomiting no fever.   She did not follow up with GI who had recommended a colonoscopy 10/2012.    Pt has no explanation for this   Allergies  Allergen Reactions  . Aripiprazole Other (See Comments)    Excessive weight gain  . Escitalopram Oxalate Other (See Comments)    unknown  . Shrimp [Shellfish Allergy] Other (See Comments)    On allergy testing only  . Enalapril Rash   Past Medical History  Diagnosis Date  . Depression   . Hypertension   . GERD (gastroesophageal reflux disease)   . Hyperlipidemia   . Menopause   . Hypothyroidism   . Insomnia   . Anemia   . Anxiety   . Vitamin D deficiency   . Hiatal hernia   . Esophageal stricture   . Renal insufficiency   . Diverticulitis   . Kidney stones   . Allergic rhinitis    Past Surgical History  Procedure Laterality Date  . Nasal sinus surgery  7/99  . Nissen fundoplication  5/18  . Hemorrhoid surgery    . Mass excision  06/08/2011    Procedure: MINOR EXCISION OF MASS;  Surgeon: Cammie Sickle., MD;  Location: [facility];  Service: Orthopedics;  Laterality: Right;  excisional biopsy dorsal right hand  . Retinal detachment surgery      Gas bubble in center of right eye.  Pt not to  lye completely flat.   History   Social History  . Marital Status: Divorced    Spouse Name: N/A    Number of Children: 2  . Years of Education: N/A   Occupational History  . Program [employer]   Social History Main Topics  . Smoking status: Never Smoker   . Smokeless tobacco: Never Used  . Alcohol Use: Yes     Comment: occasionally  . Drug Use: No  . Sexual Activity: No   Other Topics Concern  . Not on file   Social History Narrative  . No narrative on file   Family History  Problem Relation Age of Onset  . COPD Mother   . COPD Father   . Urolithiasis Brother   . Arthritis Maternal Grandmother   . Depression Brother   . Kidney cancer Maternal Grandfather   . Heart failure Father   . Heart failure Mother   . Eczema Daughter    Patient Active Problem List   Diagnosis Date Noted  . ASCUS with positive high risk HPV  Dr. Lynnette Caffey 10/2013 10/19/2013  . Diarrhea 09/30/2012  . Retinal detachment 09/30/2012  . Diverticulitis 09/25/2012  . Abdominal pain 09/25/2012  . Nausea vomiting and diarrhea 09/25/2012  .  ARF (acute renal failure) 09/25/2012  . Hypercalcemia 09/17/2012  . 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 Postmenopausal atrophic vaginitis 05/26/2011  . Herpes 04/24/2011  . Anemia   . Anxiety   . Acne vulgaris 04/21/2008  . Keloid 10/22/2007  . Hypothyroidism 04/23/2007  . Carpal tunnel syndrome 10/22/2006  . Benign neoplasm of skin 02/27/2006  . Pain, neck 02/27/2006  . Paresthesia 02/27/2006  . Reflux esophagitis 02/20/2005  . Obesity 10/04/2004  . Heat exhaustion 10/04/2004  . Depression 11/10/2003  . Allergic rhinitis, cause unspecified 11/10/2003  . Calculus of ureter 11/10/2003  . Insomnia 11/10/2003  . Disorder of bone and cartilage, unspecified 03/24/2003  . Plantar fasciitis 03/04/2002  . Hyperlipidemia 01/14/2002  . Fatigue 12/03/2001  . Pain in the chest 12/03/2001  . Pain, abdominal, epigastric 12/03/2001  . UTI  (urinary tract infection) 03/25/2000  . Hemorrhoids, external 03/02/1999  . HTN (hypertension) 01/24/1999  . Allergic rhinitis due to dust 01/24/1999   Current Outpatient Prescriptions on File Prior to Visit  Medication Sig Dispense Refill  . acyclovir (ZOVIRAX) 400 MG tablet Take 400 mg by mouth daily.      Marland Kitchen buPROPion (WELLBUTRIN XL) 150 MG 24 hr tablet Take 450 mg by mouth daily.       . ciprofloxacin (CIPRO) 500 MG tablet Take 1 tablet (500 mg total) by mouth 2 (two) times daily.  14 tablet  0  . conjugated estrogens (PREMARIN) vaginal cream Place 1 Applicatorful vaginally 2 (two) times a week.      . DYMISTA 137-50 MCG/ACT SUSP Place 1 spray into the nose 2 (two) times daily as needed (allergies). Each nostril      . EPIPEN 2-PAK 0.3 MG/0.3ML DEVI Inject 0.3 mg into the muscle daily as needed (allergic reaction).       . Eszopiclone 3 MG TABS Take 3 mg by mouth at bedtime. Take immediately before bedtime      . HYDROcodone-acetaminophen (NORCO/VICODIN) 5-325 MG per tablet Take 1-2 tablets by mouth every 6 (six) hours as needed.  15 tablet  0  . levothyroxine (SYNTHROID, LEVOTHROID) 75 MCG tablet Take 75 mcg by mouth daily before breakfast.      . loratadine (CLARITIN) 10 MG tablet Take 10 mg by mouth at bedtime.       Marland Kitchen LORazepam (ATIVAN) 1 MG tablet Take 2 mg by mouth at bedtime. Take two tablets nightly for sleep      . metroNIDAZOLE (FLAGYL) 500 MG tablet Take 1 tablet (500 mg total) by mouth 2 (two) times daily.  14 tablet  0  . ondansetron (ZOFRAN) 4 MG tablet Take 1 tablet (4 mg total) by mouth every 6 (six) hours.  12 tablet  0  . Probiotic Product (PROBIOTIC FORMULA PO) Take 1 capsule by mouth daily.      . Psyllium (METAMUCIL PO) Take 1 Package by mouth once a week.       . simvastatin (ZOCOR) 10 MG tablet Take 10 mg by mouth daily.      Marland Kitchen spironolactone (ALDACTONE) 25 MG tablet Take 25 mg by mouth 2 (two) times daily.       . Vortioxetine HBr (BRINTELLIX) 5 MG TABS Take 20 mg  by mouth daily.       . [DISCONTINUED] azelastine (ASTELIN) 137 MCG/SPRAY nasal spray One spray in each nostril twice daily as needed for allergies.       . [DISCONTINUED] cloNIDine (CATAPRES) 0.1 MG tablet Take one tablet up to three times daily as  needed to reduce excessive sweating.       . [DISCONTINUED] mirtazapine (REMERON) 15 MG tablet Take one tablet once daily at bedtime        No current facility-administered medications on file prior to visit.       Review of Systems    see HPI Objective:   Physical Exam Physical Exam  Nursing note and vitals reviewed.  Constitutional: She is oriented to person, place, and time. She appears well-developed and well-nourished.  HENT:  Head: Normocephalic and atraumatic.  Cardiovascular: Normal rate and regular rhythm. Exam reveals no gallop and no friction rub.  No murmur heard.  Pulmonary/Chest: Breath sounds normal. She has no wheezes. She has no rales.  Abd:  Soft NT/ND  BS pos  No HSM.  No peritioneal sign no rebound no referred rebound  Rectal brown stool guaiac neg  Neurological: She is alert and oriented to person, place, and time.  Skin: Skin is warm and dry.  Psychiatric: She has a normal mood and affect. Her behavior is normal.            Assessment & Plan:  Acute colitis  Will give 3 more days of cipro 750 and flagyl 500 tid.  Will set up appt with [facility].  Pt advised of complications of abscess and perforation .  Check WBC today .   Clinically she is better but if WBC still elevated, will need to Ct again  Diverticulitis  Stressed importance of Gi follow up and will need eventual colonoscopy

## 2013-12-02 NOTE — Patient Instructions (Signed)
Will make appointment with [facility]  For diverticulitis  To lab today

## 2013-12-03 ENCOUNTER — Telehealth: Payer: Self-pay | Admitting: Internal Medicine

## 2013-12-03 NOTE — Telephone Encounter (Signed)
Spoke with pt and informed of lab results  She feels well.  Advised to keep appt with GI next weekand to follow through with the colonoscopy as advised in past by Dr. Olevia Perches

## 2013-12-06 ENCOUNTER — Encounter: Payer: Self-pay | Admitting: Internal Medicine

## 2013-12-07 ENCOUNTER — Encounter: Payer: Self-pay | Admitting: *Deleted

## 2013-12-07 NOTE — Progress Notes (Signed)
Marcia Adams is coming in this week for an acute visit, She will make her 4 week follow up appointment then-eh

## 2013-12-08 ENCOUNTER — Ambulatory Visit: Payer: BC Managed Care – PPO | Admitting: Physician Assistant

## 2013-12-10 ENCOUNTER — Encounter: Payer: Self-pay | Admitting: Internal Medicine

## 2013-12-10 ENCOUNTER — Ambulatory Visit (INDEPENDENT_AMBULATORY_CARE_PROVIDER_SITE_OTHER): Payer: BC Managed Care – PPO | Admitting: Internal Medicine

## 2013-12-10 VITALS — BP 130/93 | HR 89 | Temp 98.5°F | Resp 16 | Wt 156.0 lb

## 2013-12-10 DIAGNOSIS — M791 Myalgia: Secondary | ICD-10-CM | POA: Diagnosis not present

## 2013-12-10 DIAGNOSIS — IMO0001 Reserved for inherently not codable concepts without codable children: Secondary | ICD-10-CM

## 2013-12-10 DIAGNOSIS — M609 Myositis, unspecified: Secondary | ICD-10-CM

## 2013-12-10 LAB — COMPREHENSIVE METABOLIC PANEL
ALBUMIN: 4.1 g/dL (ref 3.5–5.2)
ALK PHOS: 61 U/L (ref 39–117)
ALT: 20 U/L (ref 0–35)
AST: 22 U/L (ref 0–37)
BUN: 20 mg/dL (ref 6–23)
CO2: 23 mEq/L (ref 19–32)
Calcium: 10.1 mg/dL (ref 8.4–10.5)
Chloride: 104 mEq/L (ref 96–112)
Creat: 0.87 mg/dL (ref 0.50–1.10)
Glucose, Bld: 91 mg/dL (ref 70–99)
POTASSIUM: 4.6 meq/L (ref 3.5–5.3)
SODIUM: 137 meq/L (ref 135–145)
TOTAL PROTEIN: 6.6 g/dL (ref 6.0–8.3)
Total Bilirubin: 0.6 mg/dL (ref 0.2–1.2)

## 2013-12-10 LAB — CK: CK TOTAL: 45 U/L (ref 7–177)

## 2013-12-10 NOTE — Patient Instructions (Signed)
Will set up appt with Sports medicine   No Ibuprofen or NSAID use

## 2013-12-10 NOTE — Progress Notes (Addendum)
Subjective:    Patient ID: Marcia Adams, female    DOB: Jun 12, 1955, 58 y.o.   MRN: [id number]  HPI    Marcia Adams is here for acute visit.  She reports she has severe bilateral shoulder and muscle pain in both upper arms.  She has completed a 10 day course of Cipro and Flagyl for and acute diverticulitis initially diagnosed 10/18 at ER visit. She has Been off cipro for 3 days now  Pain only with movement.  No neck pain or Achilles pain      Diverticular symptoms resolved no abd pain nofevef no diarrhea  Allergies  Allergen Reactions  . Ciprofloxacin Other (See Comments)    Severe muscle pain   . Aripiprazole Other (See Comments)    Excessive weight gain  . Escitalopram Oxalate Other (See Comments)    unknown  . Shrimp [Shellfish Allergy] Other (See Comments)    On allergy testing only  . Enalapril Rash   Past Medical History  Diagnosis Date  . Depression   . Hypertension   . GERD (gastroesophageal reflux disease)   . Hyperlipidemia   . Menopause   . Hypothyroidism   . Insomnia   . Anemia   . Anxiety   . Vitamin D deficiency   . Hiatal hernia   . Esophageal stricture   . Renal insufficiency   . Diverticulitis   . Kidney stones   . Allergic rhinitis    Past Surgical History  Procedure Laterality Date  . Nasal sinus surgery  7/99  . Nissen fundoplication  2/95  . Hemorrhoid surgery    . Mass excision  06/08/2011    Procedure: MINOR EXCISION OF MASS;  Surgeon: Cammie Sickle., MD;  Location: [facility];  Service: Orthopedics;  Laterality: Right;  excisional biopsy dorsal right hand  . Retinal detachment surgery      Gas bubble in center of right eye.  Pt not to lye completely flat.  . 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 Marital Status: Divorced    Spouse Name: N/A    Number of Children: 2  . Years of Education: N/A   Occupational History  . Program [employer]   Social History Main Topics   . Smoking status: Never Smoker   . Smokeless tobacco: Never Used  . Alcohol Use: Yes     Comment: occasionally  . Drug Use: No  . Sexual Activity: No   Other Topics Concern  . Not on file   Social History Narrative   Family History  Problem Relation Age of Onset  . COPD Mother   . COPD Father   . Urolithiasis Brother   . Arthritis Maternal Grandmother   . Depression Brother   . Kidney cancer Maternal Grandfather   . Heart failure Father   . Heart failure Mother   . Eczema Daughter    Patient Active Problem List   Diagnosis Date Noted  . ASCUS with positive high risk HPV  Dr. Lynnette Caffey 10/2013 10/19/2013  . Diarrhea 09/30/2012  . Retinal detachment 09/30/2012  . Diverticulitis 09/25/2012  . Abdominal pain 09/25/2012  . Nausea vomiting and diarrhea 09/25/2012  . ARF (acute renal failure) 09/25/2012  . Hypercalcemia 09/17/2012  . 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 Postmenopausal atrophic vaginitis 05/26/2011  . Herpes 04/24/2011  . Anemia   . Anxiety   . Acne vulgaris 04/21/2008  . Keloid 10/22/2007  . Hypothyroidism 04/23/2007  .  Carpal tunnel syndrome 10/22/2006  . Benign neoplasm of skin 02/27/2006  . Pain, neck 02/27/2006  . Paresthesia 02/27/2006  . Reflux esophagitis 02/20/2005  . Obesity 10/04/2004  . Heat exhaustion 10/04/2004  . Depression 11/10/2003  . Allergic rhinitis, cause unspecified 11/10/2003  . Calculus of ureter 11/10/2003  . Insomnia 11/10/2003  . Disorder of bone and cartilage, unspecified 03/24/2003  . Plantar fasciitis 03/04/2002  . Hyperlipidemia 01/14/2002  . Fatigue 12/03/2001  . Pain in the chest 12/03/2001  . Pain, abdominal, epigastric 12/03/2001  . UTI (urinary tract infection) 03/25/2000  . Hemorrhoids, external 03/02/1999  . HTN (hypertension) 01/24/1999  . Allergic rhinitis due to dust 01/24/1999   Current Outpatient Prescriptions on File Prior to Visit  Medication Sig Dispense Refill  . acyclovir (ZOVIRAX) 400 MG tablet Take 400  mg by mouth daily.    Marland Kitchen buPROPion (WELLBUTRIN XL) 150 MG 24 hr tablet Take 450 mg by mouth daily.     . ciprofloxacin (CIPRO) 750 MG tablet Take 1 tablet (750 mg total) by mouth 2 (two) times daily. 6 tablet 0  . conjugated estrogens (PREMARIN) vaginal cream Place 1 Applicatorful vaginally 2 (two) times a week.    . DYMISTA 137-50 MCG/ACT SUSP Place 1 spray into the nose 2 (two) times daily as needed (allergies). Each nostril    . EPIPEN 2-PAK 0.3 MG/0.3ML DEVI Inject 0.3 mg into the muscle daily as needed (allergic reaction).     . Eszopiclone 3 MG TABS Take 3 mg by mouth at bedtime. Take immediately before bedtime    . levothyroxine (SYNTHROID, LEVOTHROID) 75 MCG tablet Take 75 mcg by mouth daily before breakfast.    . loratadine (CLARITIN) 10 MG tablet Take 10 mg by mouth at bedtime.     Marland Kitchen LORazepam (ATIVAN) 1 MG tablet Take 2 mg by mouth at bedtime. Take two tablets nightly for sleep    . metroNIDAZOLE (FLAGYL) 500 MG tablet Take 1 tablet (500 mg total) by mouth 3 (three) times daily. 9 tablet 0  . Probiotic Product (PROBIOTIC FORMULA PO) Take 1 capsule by mouth daily.    . Psyllium (METAMUCIL PO) Take 1 Package by mouth once a week.     . simvastatin (ZOCOR) 10 MG tablet Take 10 mg by mouth daily.    Marland Kitchen spironolactone (ALDACTONE) 25 MG tablet Take 25 mg by mouth 2 (two) times daily.     . Vortioxetine HBr (BRINTELLIX) 5 MG TABS Take 20 mg by mouth daily.     . [DISCONTINUED] azelastine (ASTELIN) 137 MCG/SPRAY nasal spray One spray in each nostril twice daily as needed for allergies.     . [DISCONTINUED] cloNIDine (CATAPRES) 0.1 MG tablet Take one tablet up to three times daily as needed to reduce excessive sweating.     . [DISCONTINUED] mirtazapine (REMERON) 15 MG tablet Take one tablet once daily at bedtime      No current facility-administered medications on file prior to visit.       Review of Systems    see HPI Objective:   Physical Exam  Physical Exam  Nursing note and  vitals reviewed.  Constitutional: She is oriented to person, place, and time. She appears well-developed and well-nourished.  HENT:  Head: Normocephalic and atraumatic.  Cardiovascular: Normal rate and regular rhythm. Exam reveals no gallop and no friction rub.  No murmur heard.  Pulmonary/Chest: Breath sounds normal. She has no wheezes. She has no rales.  Neurological: She is alert and oriented to person, place, and  time.  M/S  No pain to palpation of muslce belly of either deltoid , biceps, triceps or neck muscles Difficult to perform provocative tendon testing as pt has pain Skin: Skin is warm and dry.  Psychiatric: She has a normal mood and affect. Her behavior is normal.             Assessment & Plan:  Shoulder and arm pain.  Suspect Cipro related rotator cuff or biceps tendinitis.    I will document muscle enzyme values but clinically does not fit with a myositis.   Advised to avoid any NSAID and no steroids at least for now Will refer to Sports medicine.   She has been off Cipro for 3 days now . Has Tylenol #3 at home for pain   Addendum:  Of note pt did not mention any muscle,  arm or shoulder pain at my prior office visit when I saw her for ER follow up.  She reported that she had no problems when she had taken Cipro in the past

## 2013-12-11 LAB — CBC WITH DIFFERENTIAL/PLATELET
BASOS PCT: 1 % (ref 0–1)
Basophils Absolute: 0.1 10*3/uL (ref 0.0–0.1)
EOS ABS: 0.2 10*3/uL (ref 0.0–0.7)
Eosinophils Relative: 3 % (ref 0–5)
HCT: 38.3 % (ref 36.0–46.0)
HEMOGLOBIN: 13.2 g/dL (ref 12.0–15.0)
Lymphocytes Relative: 33 % (ref 12–46)
Lymphs Abs: 1.7 10*3/uL (ref 0.7–4.0)
MCH: 32.5 pg (ref 26.0–34.0)
MCHC: 34.5 g/dL (ref 30.0–36.0)
MCV: 94.3 fL (ref 78.0–100.0)
MONOS PCT: 11 % (ref 3–12)
Monocytes Absolute: 0.6 10*3/uL (ref 0.1–1.0)
NEUTROS PCT: 52 % (ref 43–77)
Neutro Abs: 2.7 10*3/uL (ref 1.7–7.7)
PLATELETS: 364 10*3/uL (ref 150–400)
RBC: 4.06 MIL/uL (ref 3.87–5.11)
RDW: 12.8 % (ref 11.5–15.5)
WBC: 5.1 10*3/uL (ref 4.0–10.5)

## 2013-12-14 ENCOUNTER — Encounter: Payer: Self-pay | Admitting: Internal Medicine

## 2013-12-14 LAB — ALDOLASE: Aldolase: 5.9 U/L (ref <=8.1)

## 2013-12-14 NOTE — Progress Notes (Signed)
I spoke with Marcia Adams about her test results. She is going to keep her appointment with Dr .Oneida Alar. -eh

## 2013-12-15 ENCOUNTER — Ambulatory Visit (INDEPENDENT_AMBULATORY_CARE_PROVIDER_SITE_OTHER): Payer: BC Managed Care – PPO | Admitting: Sports Medicine

## 2013-12-15 VITALS — BP 127/81 | Ht 65.0 in | Wt 148.0 lb

## 2013-12-15 DIAGNOSIS — M7522 Bicipital tendinitis, left shoulder: Secondary | ICD-10-CM | POA: Insufficient documentation

## 2013-12-15 DIAGNOSIS — M7521 Bicipital tendinitis, right shoulder: Secondary | ICD-10-CM | POA: Insufficient documentation

## 2013-12-15 MED ORDER — NITROGLYCERIN 0.2 MG/HR TD PT24: MEDICATED_PATCH | TRANSDERMAL | Status: DC

## 2013-12-15 NOTE — Patient Instructions (Signed)

## 2013-12-15 NOTE — Progress Notes (Signed)
Patient ID: Marcia Adams, female   DOB: [birth year], 58 y.o.   MRN: [id number] CC: arm pain  Referred courtesy of Dr Coralyn Mark who discussed with me by phone  HPI:  58yoF with recent episode of diverticulitis treated with cipro and flagyl starting 3 weeks ago (11/22/13). On day 3 of cipro her L arm started hurting at tshoulder. On Day 9 of cipro her R arm started hurting similar location. Now has pain anytime her arms are hanging at her sides. Arms feel ok if she is sitting with them supported not moving. She is limited in her daily activities, has a hard time getting dressed, feeding cats, crossing her arms, picking anything up. With these activities the pain will radiate down the front of her upper arms, sometimes back of the L arm as well. At first she was not able to raise her arms over her head but now can with some pain. She was referred here from her PCP Dr. Coralyn Mark for evaluation of possible fluoroquinolone-induced tendonopathy. She has never had shoulder problems before.   She is on simvastatin 10 mg, HLD well-controlled per pt and has a h/o hypothyroidism, on thyroid replacement medicine.  OBJECTIVE: BP 127/81 mmHg  Ht 5\' 5"  (1.651 m)  Wt 148 lb (67.132 kg)  BMI 24.63 kg/m2  PE: Gen: alert, very pleasant, in no acute distress, holds arms bent at her sides MSK/ NEURO: Normal and symmetric inspection of shoulders b/l./ forward shoulder roll bilat + point tenderness over anterior shoulder b/l, L>R Pain with straight arm raise >90 degrees anteriorly and with abduction and elevation Strength 5/5 supraspinatus with arm raise against resistance at 10 degrees at sides b/l Limited back scratch b/l due to pain.  Internal and external rotation strength 5/5, equal b/l. All strength testing is normal but resistance causes pain Speeds and Yergason's cause the most pain  Ultrasound of shoulders b/l: No tendon tears in the rotator cuff muscles b/l. L proximal biceps tendon with small amount  of fluid surrounding tendon and probable partial tear/  RT BT also shows increase in fluid in upper sheath;  Rotator cuff tendons do show some increased hypoechoic changes suggestive of some fluid in SST and subscap but not significant.  AC joints with mild DJD on RT.  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

## 2013-12-17 ENCOUNTER — Encounter: Payer: Self-pay | Admitting: Physician Assistant

## 2013-12-17 ENCOUNTER — Ambulatory Visit (INDEPENDENT_AMBULATORY_CARE_PROVIDER_SITE_OTHER): Payer: BC Managed Care – PPO | Admitting: Physician Assistant

## 2013-12-17 VITALS — BP 110/72 | HR 88 | Ht 64.0 in | Wt 155.5 lb

## 2013-12-17 DIAGNOSIS — Z8371 Family history of colonic polyps: Secondary | ICD-10-CM

## 2013-12-17 DIAGNOSIS — K5790 Diverticulosis of intestine, part unspecified, without perforation or abscess without bleeding: Secondary | ICD-10-CM

## 2013-12-17 NOTE — Progress Notes (Signed)
Patient ID: AKIERA ALLBAUGH, female   DOB: 12-Nov-1955, 58 y.o.   MRN: [id number]     History of Present Illness: Neoma Laming is a 58 year old female who was hospitalized for several days in August 2014 with nausea, vomiting, and acute colitis. CT scan showed a long segment of the descending colon with pericolic stranding consistent with colitis. Diverticulitis was a possibility 2. She had a small amount of pelvic fluid and a small hiatal hernia. Her white cell count was 16,500. She was treated with intravenous antibiotics and bowel rest and discharged home. She was seen in follow-up by Dr. Maurene Capes and advised to have a colonoscopy but she had a $700 co-pay and was unable to schedule the colonoscopy.  On 10/03/2013 she went to [place] to visit her daughter. She developed lower abdominal pain and pain across the low back. She vomited the following morning and then felt okay. On October 18 she developed the same lower abdominal pain and lower back pain. She had vomiting and diarrhea and was unable to keep fluids down she went to the ER at [place] via ambulance. She had a CT scan that showed diverticulitis. She was sent home on oral Cipro and Flagyl and on the third day developed left shoulder and arm pain. 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 She completed her course of antibiotics and has no further abdominal pain. Her bowel movements have regulated and are small and soft. She is using a probiotic twice daily and Mira lax as needed. She does report that sometimes she has to push on her perineum to complete a bowel movement. She has no pneumaturia and no dysuria. She reports that her mother had colon polyps in her 35s. There is no family history of inflammatory bowel disease or colon cancer.   Past  Medical History  Diagnosis Date  . Depression   . Hypertension   . GERD (gastroesophageal reflux disease)   . Hyperlipidemia   . Menopause   . Hypothyroidism   . Insomnia   . Anemia   . Anxiety   . Vitamin D deficiency   . Hiatal hernia   . Esophageal stricture   . Renal insufficiency   . Diverticulitis   . Kidney stones   . Allergic rhinitis     Past Surgical History  Procedure Laterality Date  . Nasal sinus surgery  7/99  . Nissen fundoplication  7/74  . Hemorrhoid surgery    . Mass excision  06/08/2011    Procedure: MINOR EXCISION OF MASS;  Surgeon: Cammie Sickle., MD;  Location: [place];  Service: Orthopedics;  Laterality: Right;  excisional biopsy dorsal right hand  . Retinal detachment surgery      Gas bubble in center of right eye.  Pt not to lye completely flat.  . 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 COPD Mother   . COPD Father   . Urolithiasis Brother   . Arthritis Maternal Grandmother   . Depression Brother   . Kidney cancer Maternal Grandfather   . Heart failure Father   . Heart failure Mother   . Eczema Daughter    History  Substance Use Topics  . Smoking status: Never Smoker   .  Smokeless tobacco: Never Used  . Alcohol Use: Yes     Comment: occasionally   Current Outpatient Prescriptions  Medication Sig Dispense Refill  . buPROPion (WELLBUTRIN XL) 150 MG 24 hr tablet Take 450 mg by mouth daily.     Marland Kitchen conjugated estrogens (PREMARIN) vaginal cream Place 1 Applicatorful vaginally 2 (two) times a week.    . DYMISTA 137-50 MCG/ACT SUSP Place 1 spray into the nose 2 (two) times daily as needed (allergies). Each nostril    . EPIPEN 2-PAK 0.3 MG/0.3ML DEVI Inject 0.3 mg into the muscle daily as needed (allergic reaction).     . Eszopiclone 3 MG TABS Take 3 mg by mouth at bedtime. Take immediately before bedtime    . levothyroxine (SYNTHROID, LEVOTHROID) 75 MCG tablet Take 75 mcg by mouth  daily before breakfast.    . loratadine (CLARITIN) 10 MG tablet Take 10 mg by mouth at bedtime.     Marland Kitchen LORazepam (ATIVAN) 1 MG tablet Take 2 mg by mouth at bedtime. Take two tablets nightly for sleep    . nitroGLYCERIN (NITRODUR - DOSED IN MG/24 HR) 0.2 mg/hr patch Use 1/4 patch daily for the left shoulder 8 patch 1  . Probiotic Product (PROBIOTIC FORMULA PO) Take 1 capsule by mouth daily.    . Psyllium (METAMUCIL PO) Take 1 Package by mouth once a week.     . spironolactone (ALDACTONE) 25 MG tablet Take 25 mg by mouth 2 (two) times daily.     . Vortioxetine HBr (BRINTELLIX) 5 MG TABS Take 20 mg by mouth daily.     . [DISCONTINUED] azelastine (ASTELIN) 137 MCG/SPRAY nasal spray One spray in each nostril twice daily as needed for allergies.     . [DISCONTINUED] cloNIDine (CATAPRES) 0.1 MG tablet Take one tablet up to three times daily as needed to reduce excessive sweating.     . [DISCONTINUED] mirtazapine (REMERON) 15 MG tablet Take one tablet once daily at bedtime      No current facility-administered medications for this visit.   Allergies  Allergen Reactions  . Ciprofloxacin Other (See Comments)    Severe muscle pain; Pt got tendonosis  . Aripiprazole Other (See Comments)    Excessive weight gain  . Escitalopram Oxalate Other (See Comments)    unknown  . Shrimp [Shellfish Allergy] Other (See Comments)    On allergy testing only  . Enalapril Rash      Review of Systems: Gen: Denies any fever, chills, sweats, anorexia, fatigue, weakness, malaise, weight loss, and sleep disorder CV: Denies chest pain, angina, palpitations, syncope, orthopnea, PND, peripheral edema, and claudication. Resp: Denies dyspnea at rest, dyspnea with exercise, cough, sputum, wheezing, coughing up blood, and pleurisy. GI: Denies vomiting blood, jaundice, and fecal incontinence.   Denies dysphagia or odynophagia. GU : Denies urinary burning, blood in urine, urinary frequency, urinary hesitancy, nocturnal  urination, and urinary incontinence. MS: Denies joint pain, limitation of movement, and swelling, stiffness, low back pain, extremity pain. Denies muscle weakness, cramps, atrophy.  Derm: Denies rash, itching, dry skin, hives, moles, warts, or unhealing ulcers.  Psych: Denies depression, anxiety, memory loss, suicidal ideation, hallucinations, paranoia, and confusion. Heme: Denies bruising, bleeding, and enlarged lymph nodes. Neuro:  Denies any headaches, dizziness, paresthesia Endo:  Denies any problems with DM, thyroid, adrenal  Studies  ABDOMEN AND PELVIS WITH CONTRAST 09/24/12  Technique: Multidetector CT imaging of the abdomen and pelvis was performed following the standard protocol during bolus administration of intravenous contrast.  Contrast: 82mL OMNIPAQUE  IOHEXOL 300 MG/ML SOLN, 26mL OMNIPAQUE IOHEXOL 300 MG/ML SOLN  Comparison: 02/01/2006  Findings: There is wall thickening of the descending colon and proximal sigmoid colon with associated pericolonic fat inflammation. There are numerous sigmoid colon diverticula with other diverticula scattered elsewhere throughout the colon. Given the relatively long segment of bowel involved with wall thickening and adjacent inflammation, diverticulitis is felt less likely. This is more likely a diffuse infectious or inflammatory colitis. The transverse right colon showed diverticula but no inflammatory changes. A normal the appendix is not visualized, but there are no findings of appendicitis. The small bowel is unremarkable. The stomach is moderately distended and there is a moderate sized hiatal hernia with the surgical vascular clips at the esophageal hiatus and may be from previous hiatal hernia surgery or from the Nissen fundoplication.  There are minor reticular opacities at the lung bases consistent with subsegmental atelectasis.  The liver, spleen and pancreas normal appearing gallbladder is distended but  otherwise unremarkable. There is no bile duct dilation. There are no adrenal masses.  There is a mid pole right renal cyst. The kidneys are otherwise unremarkable. Normal ureters and bladder. The uterus and adnexa are unremarkable.  No adenopathy is seen.  There is a trace amount of ascites along the right lateral margin of the liver and collecting in the posterior pelvic recess.  No osteoblastic or osteolytic lesions.  IMPRESSION: Wall thickening and adjacent fat inflammation is seen along the entire descending colon in the proximal sigmoid colon most likely due to an infectious or inflammatory colitis. There are numerous diverticula. Diverticulitis is not excluded but felt less likely given the extent of colonic involvement.  Trace amount of ascites.  Moderate gastric distention which may be a mild ileus related to the colonic inflammation. Small to moderate hiatal hernia.  No other acute findings.   Original Report Authenticated By: Lajean Manes, M.D.         Ct Abdomen Pelvis W Contrast  11/22/2013   CLINICAL DATA:  58 year old female with acute bilateral flank pain, nausea vomiting diarrhea. Initial encounter. Personal history of Nissen fundoplication.  EXAM: CT ABDOMEN AND PELVIS WITH CONTRAST  TECHNIQUE: Multidetector CT imaging of the abdomen and pelvis was performed using the standard protocol following bolus administration of intravenous contrast.  CONTRAST:  130mL OMNIPAQUE IOHEXOL 300 MG/ML  SOLN  COMPARISON:  CT Abdomen and Pelvis 22,014 and earlier.  FINDINGS: Chronic elevation of the right hemidiaphragm is similar. Minor lung base atelectasis. No pericardial or pleural effusion.  No acute osseous abnormality identified.  Decompressed rectum. Uterus and adnexa appear within normal limits. Decompressed bladder.  Thick walled and inflamed sigmoid colon along a segment of about 20 cm where there are numerous diverticula. Small volume free fluid  surrounding the abnormal colon. Mesenteric stranding. No organized fluid collection or extraluminal gas.  The colonic wall thickening in inflammation and continues into the left colon which is redundant. A continues through the splenic flexure, and abates in the distal transverse colon. Mesenteric stranding noted throughout the left colon. Only occasional diverticula in that segment.  Proximal transverse colon is within normal limits. Redundant hepatic flexure with moderate to severe diverticulosis but no active inflammation. Cecum and appendix within normal limits. No dilated small bowel.  The stomach is distended. There is a chronic moderate gastric hernia. Duodenum is mildly distended.  Interval resolved perihepatic free fluid. Stable mildly distended gallbladder. Liver, spleen, pancreas, adrenal glands, and kidneys are stable and within normal limits for age. Portal venous system  within normal limits. Major arterial structures in the abdomen and pelvis are patent. Mild Aortoiliac calcified atherosclerosis noted. No lymphadenopathy identified.  IMPRESSION: 1. Acute long segment colitis from the splenic flexure through the sigmoid colon. This may have started as an acute diverticulitis of the sigmoid colon, but there are few if any diverticula in the more proximally affected segment. Mesenteric inflammation and small volume free fluid with no abscess or free air. 2. Diverticulosis in the right colon without active inflammation. 3. No other acute findings in the abdomen or pelvis.   Electronically Signed   By: Lars Pinks M.D.   On: 11/22/2013 22:20     Physical Exam: General: Pleasant, well developed ,female in no acute distress Head: Normocephalic and atraumatic Eyes:  sclerae anicteric, conjunctiva pink  Ears: Normal auditory acuity Lungs: Clear throughout to auscultation Heart: Regular rate and rhythm Abdomen: Soft, non distended, non-tender. No masses, no hepatomegaly. Normal bowel  sound Musculoskeletal: Symmetrical with no gross deformities  Extremities: No edema  Neurological: Alert oriented x 4, grossly nonfocal Psychological:  Alert and cooperative. Normal mood and affect  Assessment and Recommendations: 58 year old female with a family history of colon polyps, status post 2 episodes of diverticulitis. Here for evaluation. He has been advised to use Mira lax one half to one capful daily and titrate as needed to avoid becoming constipated. She will be scheduled for a colonoscopy to screen for polyps, neoplasia, or inflammatory process.The risks, benefits, and alternatives to colonoscopy with possible biopsy and possible polypectomy were discussed with the patient and they consent to proceed.  We will wait several weeks before scheduling her as her most recent episode of diverticulitis was so recent. The procedure will be scheduled with Dr. Olevia Perches. Further recommendations will be made pending the findings of her colonoscopy    Aarion Kittrell, Vita Barley PA-C 12/17/2013,

## 2013-12-17 NOTE — Patient Instructions (Signed)
You have been scheduled for a colonoscopy. Please follow written instructions given to you at your visit today.  Please pick up your prep kit at the pharmacy within the next 1-3 days. If you use inhalers (even only as needed), please bring them with you on the day of your procedure. Your physician has requested that you go to www.startemmi.com and enter the access code given to you at your visit today. This web site gives a general overview about your procedure. However, you should still follow specific instructions given to you by our office regarding your preparation for the procedure. Use Miralax 1/2-1 capful in 8 oz water daily as needed, may titrate if diarrhea occurs. CC:  Emi Belfast MD

## 2013-12-20 NOTE — Progress Notes (Signed)
Reviewed. CT scan was suggestive of segmental colitis( ischemic?) rather than diverticulitis. I agree with colonoscopy.

## 2013-12-29 ENCOUNTER — Other Ambulatory Visit: Payer: Self-pay | Admitting: Internal Medicine

## 2013-12-29 NOTE — Telephone Encounter (Signed)
Refill request

## 2013-12-30 ENCOUNTER — Encounter: Payer: Self-pay | Admitting: Internal Medicine

## 2014-01-14 ENCOUNTER — Ambulatory Visit: Payer: BC Managed Care – PPO | Admitting: Sports Medicine

## 2014-02-02 ENCOUNTER — Encounter: Payer: Self-pay | Admitting: Internal Medicine

## 2014-02-02 ENCOUNTER — Ambulatory Visit (AMBULATORY_SURGERY_CENTER): Payer: BC Managed Care – PPO | Admitting: Internal Medicine

## 2014-02-02 VITALS — BP 126/76 | HR 71 | Temp 98.6°F | Resp 14 | Ht 64.0 in | Wt 155.0 lb

## 2014-02-02 DIAGNOSIS — D120 Benign neoplasm of cecum: Secondary | ICD-10-CM

## 2014-02-02 DIAGNOSIS — K5790 Diverticulosis of intestine, part unspecified, without perforation or abscess without bleeding: Secondary | ICD-10-CM

## 2014-02-02 MED ORDER — SODIUM CHLORIDE 0.9 % IV SOLN: 500.0000 mL | INTRAVENOUS | Status: DC

## 2014-02-02 NOTE — Op Note (Signed)
[employer]  [address] [place], [id number]   COLONOSCOPY PROCEDURE REPORT  PATIENT: Marcia Adams, Marcia Adams  MR#: [id number] BIRTHDATE: [birth year] , 58  yrs. old GENDER: female ENDOSCOPIST: Lafayette Dragon, MD REFERRED KX:[id number] Coralyn Mark, M.D. PROCEDURE DATE:  02/02/2014 PROCEDURE:   Colonoscopy with snare polypectomy First Screening Colonoscopy - Avg.  risk and is 50 yrs.  old or older Yes.  Prior Negative Screening - Now for repeat screening. N/A  History of Adenoma - Now for follow-up colonoscopy & has been > or = to 3 yrs.  N/A  Polyps Removed Today? Yes. ASA CLASS:   Class II INDICATIONS:history of fall segmental colitis in August 2014.  Long segment of sigmoid colon consistent with colitis.  Positive family history of colon polyps.  Episode of diverticulitis in October 2015. MEDICATIONS: Monitored anesthesia care and Propofol 400 mg IV  DESCRIPTION OF PROCEDURE:   After the risks benefits and alternatives of the procedure were thoroughly explained, informed consent was obtained.  The digital rectal exam revealed no abnormalities of the rectum.   The LB [id number]  endoscope was introduced through the anus and advanced to the cecum, which was identified by both the appendix and ileocecal valve. No adverse events experienced.   The quality of the prep was good, using MoviPrep  The instrument was then slowly withdrawn as the colon was fully examined.      COLON FINDINGS: A firm sessile polyp measuring 10 mm in size was found at the cecum.  A polypectomy was performed with a cold snare. There was severe diverticulosis noted throughout the entire examined colon with associated colonic narrowing, tortuosity and angulation.  Retroflexed views revealed no abnormalities. The time to cecum=30 minutes 59 seconds.  Withdrawal time=6 minutes 14 seconds.  The scope was withdrawn and the procedure completed. COMPLICATIONS: There were no immediate  complications.  ENDOSCOPIC IMPRESSION: 1.   Sessile polyp was found at the cecum; polypectomy was performed with a cold snare 2.   There was severe diverticulosis noted throughout the entire examined colon with partial obstruction of the sigmoid colon. Narrow lumen. Difficult to negotiate through the sigmoid colon  RECOMMENDATIONS: 1.  Await pathology results 2.  Benefiber 2 teaspoons daily 3 .High fiber diet 4. Recall colonoscopy pending path report, consider surgical resection if she develops recurrent diverticulitis  eSigned:  Lafayette Dragon, MD 02/02/2014 2:23 PM   cc:   PATIENT NAME:  Marcia, Adams MR#: [id number]

## 2014-02-02 NOTE — Progress Notes (Signed)
Called to room to assist during endoscopic procedure.  Patient ID and intended procedure confirmed with present staff. Received instructions for my participation in the procedure from the performing physician.  

## 2014-02-02 NOTE — Progress Notes (Signed)
Report to PACU, RN, vss, BBS= Clear.  

## 2014-02-02 NOTE — Patient Instructions (Signed)
Discharge instructions given. Handouts on polyps and diverticulosis. Resume previous medications. YOU HAD AN ENDOSCOPIC PROCEDURE TODAY AT THE [facility]: Refer to the procedure report that was given to you for any specific questions about what was found during the examination.  If the procedure report does not answer your questions, please call your gastroenterologist to clarify.  If you requested that your care partner not be given the details of your procedure findings, then the procedure report has been included in a sealed envelope for you to review at your convenience later.  YOU SHOULD EXPECT: Some feelings of bloating in the abdomen. Passage of more gas than usual.  Walking can help get rid of the air that was put into your GI tract during the procedure and reduce the bloating. If you had a lower endoscopy (such as a colonoscopy or flexible sigmoidoscopy) you may notice spotting of blood in your stool or on the toilet paper. If you underwent a bowel prep for your procedure, then you may not have a normal bowel movement for a few days.  DIET: Your first meal following the procedure should be a light meal and then it is ok to progress to your normal diet.  A half-sandwich or bowl of soup is an example of a good first meal.  Heavy or fried foods are harder to digest and may make you feel nauseous or bloated.  Likewise meals heavy in dairy and vegetables can cause extra gas to form and this can also increase the bloating.  Drink plenty of fluids but you should avoid alcoholic beverages for 24 hours.  ACTIVITY: Your care partner should take you home directly after the procedure.  You should plan to take it easy, moving slowly for the rest of the day.  You can resume normal activity the day after the procedure however you should NOT DRIVE or use heavy machinery for 24 hours (because of the sedation medicines used during the test).    SYMPTOMS TO REPORT IMMEDIATELY: A gastroenterologist  can be reached at any hour.  During normal business hours, 8:30 AM to 5:00 PM Monday through Friday, call [phone].  After hours and on weekends, please call the GI answering service at [phone] who will take a message and have the physician on call contact you.   Following lower endoscopy (colonoscopy or flexible sigmoidoscopy):  Excessive amounts of blood in the stool  Significant tenderness or worsening of abdominal pains  Swelling of the abdomen that is new, acute  Fever of 100F or higher  FOLLOW UP: If any biopsies were taken you will be contacted by phone or by letter within the next 1-3 weeks.  Call your gastroenterologist if you have not heard about the biopsies in 3 weeks.  Our staff will call the home number listed on your records the next business day following your procedure to check on you and address any questions or concerns that you may have at that time regarding the information given to you following your procedure. This is a courtesy call and so if there is no answer at the home number and we have not heard from you through the emergency physician on call, we will assume that you have returned to your regular daily activities without incident.  SIGNATURES/CONFIDENTIALITY: You and/or your care partner have signed paperwork which will be entered into your electronic medical record.  These signatures attest to the fact that that the information above on your After Visit Summary has been reviewed   and is understood.  Full responsibility of the confidentiality of this discharge information lies with you and/or your care-partner. 

## 2014-02-03 ENCOUNTER — Telehealth: Payer: Self-pay

## 2014-02-03 NOTE — Telephone Encounter (Signed)
  Follow up Call-  Call back number 02/02/2014  Post procedure Call Back phone  # [phone] hm  Permission to leave phone message Yes     Patient questions:  Do you have a fever, pain , or abdominal swelling? No. Pain Score  0 *  Have you tolerated food without any problems? Yes.    Have you been able to return to your normal activities? Yes.    Do you have any questions about your discharge instructions: Diet   No. Medications  No. Follow up visit  No.  Do you have questions or concerns about your Care? No.  Actions: * If pain score is 4 or above: No action needed, pain <4.

## 2014-02-09 ENCOUNTER — Encounter: Payer: Self-pay | Admitting: Internal Medicine

## 2014-02-11 ENCOUNTER — Encounter: Payer: Self-pay | Admitting: Internal Medicine

## 2014-02-23 ENCOUNTER — Encounter: Payer: Self-pay | Admitting: Sports Medicine

## 2014-02-23 ENCOUNTER — Ambulatory Visit (INDEPENDENT_AMBULATORY_CARE_PROVIDER_SITE_OTHER): Payer: BC Managed Care – PPO | Admitting: Sports Medicine

## 2014-02-23 VITALS — BP 112/78 | Ht 65.0 in | Wt 148.0 lb

## 2014-02-23 DIAGNOSIS — M7521 Bicipital tendinitis, right shoulder: Secondary | ICD-10-CM | POA: Diagnosis not present

## 2014-02-23 DIAGNOSIS — M7522 Bicipital tendinitis, left shoulder: Secondary | ICD-10-CM

## 2014-02-23 NOTE — Progress Notes (Signed)
  Marcia Adams - 59 y.o. female MRN [id number]  Date of birth: 18-Apr-1955  SUBJECTIVE:  Including CC & ROS.  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 Patient reports that after 8 weeks of nitroglycerin patch treatment on her left shoulder particularly over the biceps tendon which showed small amount of fluid around the biceps with partial tear on [place], patient has had great clinical improvement and resolution of pain. She reports that her left shoulder feels like it's back to baseline. Her right shoulder continues to cause her discomfort particularly on the lateral aspect with radiation into the deltoid. Her ultrasound the last time on the right did show some increased fluid around the biceps tendon the rotator cuff muscles showed some fluid suggested of supraspinatus tendinopathy. She started treating her right shoulder with nitroglycerin for the past week. She's been working on strengthening as well with biceps curls with no weight and spokes of the wheel.   ROS: Review of systems otherwise negative except for information present in HPI  HISTORY: Past Medical, Surgical, Social, and Family History Reviewed & Updated per EMR. Pertinent Historical Findings include: Treated with fluoroquinolone ciprofloxacin back in October 2015 for diverticulitis Hyperlipidemia currently on statin therapy Hypothyroidism on thyroid replacement  DATA REVIEWED: Personally reviewed patient's last ultrasound  PHYSICAL EXAM:  VS: BP:112/78 mmHg  HR: bpm  TEMP: ( )  RESP:   HT:5\' 5"  (165.1 cm)   WT:148 lb (67.132 kg)  BMI:24.7 BILATERAL SHOULDER EXAM:  General: well nourished Skin of UE: warm; dry, no rashes, lesions, ecchymosis or erythema. Vascular: radial pulses 2+ bilaterally Neurologically: Normal sensation  with no sensory or motor defects in C4-C8, bilateral Palpation: no TTP AC joint, acromion, no bicipital grove tenderness, mild supraspinatus tenderness ROM active/passive: symmetric full 180 degree of abduction and forward flexion mild pain at end range, symmetric internal (80-90) rotation, Appley's scratch test equal bilaterally T12 Strength testing: 5/5 symmetric strength in internal and external rotation, 4+/5 forward flexion/ empty can on the right compared to the left,      Special Test: positive Neer's for pain on right, positive Hawkins or pain on right, positive empty can for pain on right, neg O'Brien, neg speeds, neg apprehension  MSK [place]: Using Paolini et al [place] protocol.  Left: resolving fluid around biceps and resolution of partial tear Right: minimal fluid are the biceps at the grove, normal long axis view, mild hypoechoic changes with out tear supraspinatus, subscapularis, and infraspinatus, and teres minor intact with no tears   ASSESSMENT & PLAN: See problem based charting & AVS for pt instructions.

## 2014-02-24 MED ORDER — NITROGLYCERIN 0.2 MG/HR TD PT24: MEDICATED_PATCH | TRANSDERMAL | Status: DC

## 2014-02-24 NOTE — Assessment & Plan Note (Signed)
Recommendations: Start Nitroglycerin protocol over the supraspinatus insertion as this is likely the sight of most tendonitis on the right shoulder. Continue home exercises F/U in 6 week to reassess

## 2014-03-16 ENCOUNTER — Other Ambulatory Visit: Payer: Self-pay | Admitting: *Deleted

## 2014-03-16 ENCOUNTER — Other Ambulatory Visit: Payer: Self-pay | Admitting: Internal Medicine

## 2014-03-16 MED ORDER — ESTRADIOL 10 MCG VA TABS: ORAL_TABLET | VAGINAL | Status: DC

## 2014-03-16 NOTE — Telephone Encounter (Signed)
Pt needs refill of Vagifem

## 2014-03-16 NOTE — Telephone Encounter (Signed)
Patient called to request refill on her Vagifem 10 mcg tablets.  She states her Rx has expired.

## 2014-03-16 NOTE — Telephone Encounter (Signed)
Sending req to Dr. Coralyn Mark for approval

## 2014-03-16 NOTE — Addendum Note (Signed)
Addended by: Emi Belfast D on: 03/16/2014 02:15 PM   Modules accepted: Orders

## 2014-03-17 NOTE — Telephone Encounter (Signed)
Marcia Adams  This has already been done   Call pharmacy to verify

## 2014-04-06 ENCOUNTER — Other Ambulatory Visit: Payer: BC Managed Care – PPO | Admitting: Sports Medicine

## 2014-04-08 ENCOUNTER — Other Ambulatory Visit: Payer: Self-pay | Admitting: Internal Medicine

## 2014-05-13 ENCOUNTER — Ambulatory Visit (INDEPENDENT_AMBULATORY_CARE_PROVIDER_SITE_OTHER): Payer: BC Managed Care – PPO | Admitting: Sports Medicine

## 2014-05-13 ENCOUNTER — Encounter: Payer: Self-pay | Admitting: Sports Medicine

## 2014-05-13 VITALS — BP 109/89

## 2014-05-13 DIAGNOSIS — M7521 Bicipital tendinitis, right shoulder: Secondary | ICD-10-CM

## 2014-05-13 NOTE — Assessment & Plan Note (Addendum)
Overall remarkably improved. She does have some shoulder dyskinesis and slight stiffness with external rotation and shoulder extension. HEP: add overhead wagon wheel exercises with light weight, focus on eccentric phase. Additionally add 20 of shoulder extension wagon wheel exercises limiting to 90, shoulder level. Avoid quinolones Discuss with PCP statin use, discussed although she may be sensitive, statin use may be indicated and it is worthwhile to have a further discussion with her PCP. Follow-up as needed

## 2014-05-13 NOTE — Progress Notes (Signed)
   Subjective:    Patient ID: Marcia Adams, female    DOB: 05/21/1955, 59 y.o.   MRN: [id number]  HPI Chief complaint: Follow-up right shoulder pain  Subjective: Overall patient feeling significantly improved. She has no pain. Has regained the majority of her strength but reports stiffness with terminal 10 of motion on the right compared to the left. She is continuing her home exercises and reports the seem to be extraordinarily easy. She is performing wagon wheel exercises to 90 only nothing with shoulder extension She was taking nitroglycerin until 1 week ago and has discontinued at that time. No worsening or change in symptoms since discontinuing. Denies numbness, tingling or pain radiating down past shoulder  Review of Systems per HPI     Objective:   Physical Exam GENERAL: Adult Caucasian female. No acute distress PSYCH: Alert and appropriately interactive. SKIN: No open skin lesions or abnormal skin markings on areas inspected as below VASCULAR: Bilateral radial pulses 2+/4 NEURO: Upper extremity strength is 5+/5 in all myotomes; sensation is intact to light touch in all dermatomes. RIGHT SHOULDER: Overall normal-appearing she does have slight shoulder protraction bilaterally. Active range of motion is symmetric with some guarding of the right shoulder in terminal 10. She has full overhead motion and bilateral internal rotation to T4. Internal and external rotation strength 5+/5. In D can testing 5+/5 without pain. Slight pain with Hawkins. No significant tenderness to palpation over anterior aspect of the shoulder. No significant ac joint pain. No pain and strength 5+/5 with speeds testing.     Assessment & Plan:

## 2014-06-22 ENCOUNTER — Telehealth: Payer: Self-pay | Admitting: Internal Medicine

## 2014-06-22 DIAGNOSIS — R110 Nausea: Secondary | ICD-10-CM

## 2014-06-22 DIAGNOSIS — R197 Diarrhea, unspecified: Secondary | ICD-10-CM

## 2014-06-22 NOTE — Telephone Encounter (Signed)
Hx of diverticulitis. Patient states last week on Tuesday, she had 7 hours of vomiting and diarrhea during the night. The next day she was fine. States she was up last night with several hours of vomiting, diarrhea and abdominal pain. She is better today. She is worried she is starting up with diverticulitis. Please, advise.

## 2014-06-22 NOTE — Telephone Encounter (Signed)
Stay on clear liquids, Check CBC, sed rate in our office. Refill Phenergan 25 mg, #15, 1 po q 6 hours prn  Nausea. Hold off on antibiotics, see how shw feels in next 24- 48 hours.

## 2014-06-23 ENCOUNTER — Other Ambulatory Visit (INDEPENDENT_AMBULATORY_CARE_PROVIDER_SITE_OTHER): Payer: BC Managed Care – PPO

## 2014-06-23 DIAGNOSIS — R197 Diarrhea, unspecified: Secondary | ICD-10-CM

## 2014-06-23 DIAGNOSIS — R110 Nausea: Secondary | ICD-10-CM | POA: Diagnosis not present

## 2014-06-23 LAB — CBC WITH DIFFERENTIAL/PLATELET
BASOS PCT: 0.6 % (ref 0.0–3.0)
Basophils Absolute: 0 10*3/uL (ref 0.0–0.1)
Eosinophils Absolute: 0.3 10*3/uL (ref 0.0–0.7)
Eosinophils Relative: 4.8 % (ref 0.0–5.0)
HCT: 38.7 % (ref 36.0–46.0)
Hemoglobin: 13 g/dL (ref 12.0–15.0)
LYMPHS PCT: 34.7 % (ref 12.0–46.0)
Lymphs Abs: 2 10*3/uL (ref 0.7–4.0)
MCHC: 33.6 g/dL (ref 30.0–36.0)
MCV: 94.6 fl (ref 78.0–100.0)
Monocytes Absolute: 0.6 10*3/uL (ref 0.1–1.0)
Monocytes Relative: 10.3 % (ref 3.0–12.0)
NEUTROS ABS: 2.9 10*3/uL (ref 1.4–7.7)
NEUTROS PCT: 49.6 % (ref 43.0–77.0)
PLATELETS: 266 10*3/uL (ref 150.0–400.0)
RBC: 4.09 Mil/uL (ref 3.87–5.11)
RDW: 11.9 % (ref 11.5–15.5)
WBC: 5.8 10*3/uL (ref 4.0–10.5)

## 2014-06-23 LAB — SEDIMENTATION RATE: SED RATE: 30 mm/h — AB (ref 0–22)

## 2014-06-23 NOTE — Telephone Encounter (Signed)
Patient notified of recommendations. She does not need Phenergan because she has nausea medication already. Labs in EPIC. She will come today for labs.

## 2014-06-25 ENCOUNTER — Other Ambulatory Visit: Payer: Self-pay | Admitting: Internal Medicine

## 2014-09-29 ENCOUNTER — Telehealth: Payer: Self-pay

## 2014-09-30 NOTE — Telephone Encounter (Signed)
Pre visit call completed 

## 2014-10-01 ENCOUNTER — Other Ambulatory Visit: Payer: Self-pay | Admitting: General Practice

## 2014-10-01 ENCOUNTER — Ambulatory Visit (INDEPENDENT_AMBULATORY_CARE_PROVIDER_SITE_OTHER): Payer: BC Managed Care – PPO | Admitting: Family Medicine

## 2014-10-01 ENCOUNTER — Encounter: Payer: Self-pay | Admitting: Family Medicine

## 2014-10-01 VITALS — BP 112/82 | HR 82 | Temp 98.2°F | Resp 17 | Ht 65.0 in | Wt 169.2 lb

## 2014-10-01 DIAGNOSIS — E038 Other specified hypothyroidism: Secondary | ICD-10-CM

## 2014-10-01 DIAGNOSIS — M949 Disorder of cartilage, unspecified: Secondary | ICD-10-CM

## 2014-10-01 DIAGNOSIS — Z1231 Encounter for screening mammogram for malignant neoplasm of breast: Secondary | ICD-10-CM

## 2014-10-01 DIAGNOSIS — E785 Hyperlipidemia, unspecified: Secondary | ICD-10-CM

## 2014-10-01 DIAGNOSIS — M899 Disorder of bone, unspecified: Secondary | ICD-10-CM

## 2014-10-01 DIAGNOSIS — I10 Essential (primary) hypertension: Secondary | ICD-10-CM

## 2014-10-01 DIAGNOSIS — F329 Major depressive disorder, single episode, unspecified: Secondary | ICD-10-CM

## 2014-10-01 DIAGNOSIS — F32A Depression, unspecified: Secondary | ICD-10-CM

## 2014-10-01 LAB — LIPID PANEL
CHOLESTEROL: 249 mg/dL — AB (ref 0–200)
HDL: 57.6 mg/dL (ref >39.00)
LDL CALC: 171 mg/dL — AB (ref 0–99)
NonHDL: 191.24
Total CHOL/HDL Ratio: 4
Triglycerides: 99 mg/dL (ref 0.0–149.0)
VLDL: 19.8 mg/dL (ref 0.0–40.0)

## 2014-10-01 LAB — CBC WITH DIFFERENTIAL/PLATELET
Basophils Absolute: 0.1 K/uL (ref 0.0–0.1)
Basophils Relative: 1 % (ref 0.0–3.0)
Eosinophils Absolute: 0.3 K/uL (ref 0.0–0.7)
Eosinophils Relative: 4.4 % (ref 0.0–5.0)
HCT: 38.8 % (ref 36.0–46.0)
Hemoglobin: 13.1 g/dL (ref 12.0–15.0)
Lymphocytes Relative: 38.4 % (ref 12.0–46.0)
Lymphs Abs: 2.3 K/uL (ref 0.7–4.0)
MCHC: 33.7 g/dL (ref 30.0–36.0)
MCV: 94.8 fl (ref 78.0–100.0)
Monocytes Absolute: 0.7 K/uL (ref 0.1–1.0)
Monocytes Relative: 12.4 % — ABNORMAL HIGH (ref 3.0–12.0)
Neutro Abs: 2.6 K/uL (ref 1.4–7.7)
Neutrophils Relative %: 43.8 % (ref 43.0–77.0)
Platelets: 268 K/uL (ref 150.0–400.0)
RBC: 4.1 Mil/uL (ref 3.87–5.11)
RDW: 12.9 % (ref 11.5–15.5)
WBC: 6 K/uL (ref 4.0–10.5)

## 2014-10-01 LAB — BASIC METABOLIC PANEL
BUN: 23 mg/dL (ref 6–23)
CHLORIDE: 103 meq/L (ref 96–112)
CO2: 29 mEq/L (ref 19–32)
Calcium: 9.8 mg/dL (ref 8.4–10.5)
Creatinine, Ser: 0.91 mg/dL (ref 0.40–1.20)
GFR: 66.81 mL/min (ref >60.00)
GLUCOSE: 95 mg/dL (ref 70–99)
POTASSIUM: 4.3 meq/L (ref 3.5–5.1)
SODIUM: 140 meq/L (ref 135–145)

## 2014-10-01 LAB — HEPATIC FUNCTION PANEL
ALBUMIN: 4.1 g/dL (ref 3.5–5.2)
ALK PHOS: 88 U/L (ref 39–117)
ALT: 22 U/L (ref 0–35)
AST: 33 U/L (ref 0–37)
BILIRUBIN TOTAL: 0.3 mg/dL (ref 0.2–1.2)
Bilirubin, Direct: 0.1 mg/dL (ref 0.0–0.3)
Total Protein: 6.8 g/dL (ref 6.0–8.3)

## 2014-10-01 LAB — TSH: TSH: 5.88 u[IU]/mL — ABNORMAL HIGH (ref 0.35–4.50)

## 2014-10-01 LAB — VITAMIN D 25 HYDROXY (VIT D DEFICIENCY, FRACTURES): VITD: 27.5 ng/mL — ABNORMAL LOW (ref 30.00–100.00)

## 2014-10-01 MED ORDER — LEVOTHYROXINE SODIUM 100 MCG PO TABS: 100.0000 ug | ORAL_TABLET | Freq: Every day | ORAL | Status: DC

## 2014-10-01 NOTE — Progress Notes (Signed)
Pre visit review using our clinic review tool, if applicable. No additional management support is needed unless otherwise documented below in the visit note. 

## 2014-10-01 NOTE — Patient Instructions (Signed)
Schedule your complete physical in 6 months We'll notify you of your lab results and make any changes if needed Keep up the good work on healthy diet and regular exercise- you look great! Mammo order is in!  Someone should call you with this appt Call with any questions or concerns Welcome!  We're glad to have you!

## 2014-10-01 NOTE — Progress Notes (Signed)
   Subjective:    Patient ID: Marcia Adams, female    DOB: [birth year], 59 y.o.   MRN: [id number]  HPI New to establish.  Previous MD- Schoenhoff.  Derm- Dr Fontaine No, GYN- Dr Lynnette Caffey, Allergy- Dr Donneta Romberg, Ophtho- [place], GI- Dr Olevia Perches  Psych- Dr Toy Care  HTN- chronic problem, on Spironolactone.  Pt reports this is for 'adult acne and not BP'.  Pt reports she had elevated BP when she 'weighed over 200 lbs'.  No CP, SOB, HAs, visual changes, edema.  Hyperlipidemia- chronic problem, previously on Simvastatin.  Not taking due to memory issues and myalgias.  Stopped medication in Nov.  No abd pain, N/V, myalgias.  Hypothyroid- chronic problem, on Synthroid.  Last labs 1 yr ago.  Denies excessive fatigue, changes to skin/hair/nails.  Depression- currently managing w/ Dr Toy Care.  On Wellbutrin, Trintellix, Ativan.  Pt reports good control of sxs.  No longer seeing therapist.   Review of Systems For ROS see HPI     Objective:   Physical Exam  Constitutional: She is oriented to person, place, and time. She appears well-developed and well-nourished. No distress.  HENT:  Head: Normocephalic and atraumatic.  Eyes: Conjunctivae and EOM are normal. Pupils are equal, round, and reactive to light.  Neck: Normal range of motion. Neck supple. No thyromegaly present.  Cardiovascular: Normal rate, regular rhythm, normal heart sounds and intact distal pulses.   No murmur heard. Pulmonary/Chest: Effort normal and breath sounds normal. No respiratory distress.  Abdominal: Soft. She exhibits no distension. There is no tenderness.  Musculoskeletal: She exhibits no edema.  Lymphadenopathy:    She has no cervical adenopathy.  Neurological: She is alert and oriented to person, place, and time.  Skin: Skin is warm and dry.  Psychiatric: She has a normal mood and affect. Her behavior is normal.  Vitals reviewed.         Assessment & Plan:

## 2014-10-04 ENCOUNTER — Ambulatory Visit: Payer: BC Managed Care – PPO | Admitting: Family Medicine

## 2014-10-04 NOTE — Assessment & Plan Note (Signed)
New to provider, ongoing for pt.  Currently asymptomatic.  Check labs.  Adjust dose prn.

## 2014-10-04 NOTE — Assessment & Plan Note (Signed)
New to provider, ongoing for pt.  Hx of Vit D abnormalities- both deficiency and elevated levels.  Check labs today and determine next step based on results

## 2014-10-04 NOTE — Assessment & Plan Note (Signed)
New to provider, ongoing for pt.  Following w/ Dr Toy Care.  Pt feels sxs are currently well controlled.  Will follow along and assist as able.

## 2014-10-04 NOTE — Assessment & Plan Note (Signed)
New to provider, ongoing for pt.  Pt reports BP has been better controlled since she weaned off psych med and lost quite a bit of weight.  On Spironolactone per derm for acne.  Check labs.  Adjust meds prn.

## 2014-10-04 NOTE — Assessment & Plan Note (Signed)
Chronic problem.  Pt is attempting to control w/ healthy diet and regular exercise.  Check labs.  Start meds prn.

## 2014-10-14 ENCOUNTER — Ambulatory Visit (HOSPITAL_BASED_OUTPATIENT_CLINIC_OR_DEPARTMENT_OTHER)
Admission: RE | Admit: 2014-10-14 | Discharge: 2014-10-14 | Disposition: A | Discharge status: Home / Self Care | Payer: BC Managed Care – PPO | Source: Ambulatory Visit | Attending: Internal Medicine | Admitting: Internal Medicine

## 2014-10-14 DIAGNOSIS — Z1231 Encounter for screening mammogram for malignant neoplasm of breast: Secondary | ICD-10-CM | POA: Diagnosis present

## 2014-11-25 ENCOUNTER — Other Ambulatory Visit: Payer: BC Managed Care – PPO

## 2014-12-02 ENCOUNTER — Encounter: Payer: Self-pay | Admitting: Family Medicine

## 2014-12-03 ENCOUNTER — Other Ambulatory Visit: Payer: BC Managed Care – PPO

## 2014-12-06 ENCOUNTER — Other Ambulatory Visit (INDEPENDENT_AMBULATORY_CARE_PROVIDER_SITE_OTHER): Payer: BC Managed Care – PPO

## 2014-12-06 DIAGNOSIS — E038 Other specified hypothyroidism: Secondary | ICD-10-CM

## 2014-12-06 LAB — TSH: TSH: 0.66 u[IU]/mL (ref 0.35–4.50)

## 2014-12-22 ENCOUNTER — Telehealth: Payer: Self-pay | Admitting: Family Medicine

## 2014-12-22 NOTE — Telephone Encounter (Signed)
Please call pt and determine whether she has tried OTC immodium or any other measures so I can give her the best, most applicable advice for her situation

## 2014-12-22 NOTE — Telephone Encounter (Signed)
Pt states that she started Lomotil yesterday (took 2 pills initially and then the repeat) did not take any today. states that things seemed better but overnight she is feeling bloated and gassy, then her BM is like liquid.

## 2014-12-22 NOTE — Telephone Encounter (Signed)
Spoke with pt and advised ok to continue lomotil. Pt stated an understanding.

## 2014-12-22 NOTE — Telephone Encounter (Signed)
Caller name: Self   Can be reached: [phone]  Pharmacy:  [facility] [id number] - [place], [address] AT [facility] [address] [phone] (Phone) [phone] (Fax)         Reason for call: Patient has had diarrhea x 4 days. States she can not leave home to come into the office because it is so bad. Has to attend her Mother's funeral this weekend. Plse Adv

## 2014-12-22 NOTE — Telephone Encounter (Signed)
This is to be expected as her symptoms will take time to improve.  Continue Lomotil as needed.  Drink plenty of fluids and stools should continue to slow

## 2015-01-10 ENCOUNTER — Ambulatory Visit (INDEPENDENT_AMBULATORY_CARE_PROVIDER_SITE_OTHER): Payer: BC Managed Care – PPO | Admitting: Family Medicine

## 2015-01-10 ENCOUNTER — Encounter: Payer: Self-pay | Admitting: Family Medicine

## 2015-01-10 VITALS — BP 110/76 | HR 86 | Temp 98.1°F | Resp 16 | Ht 65.0 in | Wt 165.4 lb

## 2015-01-10 DIAGNOSIS — M79645 Pain in left finger(s): Secondary | ICD-10-CM | POA: Diagnosis not present

## 2015-01-10 MED ORDER — MELOXICAM 7.5 MG PO TABS: 7.5000 mg | ORAL_TABLET | Freq: Every day | ORAL | Status: DC

## 2015-01-10 NOTE — Progress Notes (Signed)
Pre visit review using our clinic review tool, if applicable. No additional management support is needed unless otherwise documented below in the visit note. 

## 2015-01-10 NOTE — Progress Notes (Signed)
   Subjective:    Patient ID: Clint Lipps, female    DOB: May 16, 1955, 59 y.o.   MRN: [id number]  HPI Thumb pain- pt reports pain started suddenly on night of November 11.  Pain localized to base of L thumb.  Next day bought OTC velcro thumb splint.  No relief w/ tylenol or ibuprofen (took each for 1 day).  Over time, pain has improved.  Now only hurting w/ movement.  No known injury.  Pt feels that the way she holds her phone is responsible for pain.   Review of Systems For ROS see HPI     Objective:   Physical Exam  Constitutional: She is oriented to person, place, and time. She appears well-developed and well-nourished. No distress.  HENT:  Head: Normocephalic and atraumatic.  Cardiovascular: Intact distal pulses.   Musculoskeletal: She exhibits tenderness (TTP over L MCP and CMC joint w/o swelling or deformity). She exhibits no edema.  Neurological: She is alert and oriented to person, place, and time. She has normal reflexes. Coordination normal.  Skin: Skin is warm and dry. No erythema.  Psychiatric: She has a normal mood and affect. Her behavior is normal.  Vitals reviewed.         Assessment & Plan:

## 2015-01-10 NOTE — Patient Instructions (Signed)
Follow up as needed Start the Meloxicam once daily- take w/ food- for 7-10 days and then as needed When inflamed, wear the brace Alternate heat/ice or whichever feels better If no improvement or worsening, call so we can send you to the hand specialist Call with any questions or concerns If you want to join [place] at the new [place] office, any scheduled appointments will automatically transfer and we will see you at [address] [place] [address] [place], [place] [address] ([place] [address]) Happy Holidays!!!

## 2015-01-11 NOTE — Assessment & Plan Note (Signed)
New.  Suspect degenerative/arthritic change.  Start daily NSAID.  If no improvement, will refer to hand specialist.  Reviewed supportive care and red flags that should prompt return.  Pt expressed understanding and is in agreement w/ plan.

## 2015-01-26 ENCOUNTER — Encounter: Payer: Self-pay | Admitting: Family Medicine

## 2015-01-26 DIAGNOSIS — M79645 Pain in left finger(s): Secondary | ICD-10-CM

## 2015-01-26 NOTE — Telephone Encounter (Signed)
Referral placed.

## 2015-02-09 ENCOUNTER — Encounter: Payer: Self-pay | Admitting: Family Medicine

## 2015-02-23 ENCOUNTER — Encounter: Payer: Self-pay | Admitting: Family Medicine

## 2015-04-04 ENCOUNTER — Encounter: Payer: BC Managed Care – PPO | Admitting: Family Medicine

## 2015-04-27 ENCOUNTER — Other Ambulatory Visit: Payer: Self-pay | Admitting: Family Medicine

## 2015-04-28 NOTE — Telephone Encounter (Signed)
Medication filled to pharmacy as requested.   

## 2015-05-02 ENCOUNTER — Other Ambulatory Visit: Payer: Self-pay | Admitting: *Deleted

## 2015-05-02 MED ORDER — SIMVASTATIN 10 MG PO TABS: 10.0000 mg | ORAL_TABLET | Freq: Every day | ORAL | Status: DC

## 2015-05-02 NOTE — Telephone Encounter (Signed)
eScribe request from [facility] for refill on Simvastatin 10mg  [pt reported not taking on 09/29/14] Last filled - 04/09/14, #90x1 [Dr. Schoenhoff] Last AEX - 10/01/14 [acute 01/10/15] Next AEX - CPE in 6-mths [from 10/01/14 OV]; has CPE scheduled for 09/07/15 Also, this is message from last Lipid lab results: /SLS 03/27 Notes Recorded by Davis Gourd, CMA on 10/01/2014 at 4:51 PM The thyroid medication was filled to your [facility] pharmacy. Please call [phone] to schedule a lab appointment in 1 month. Please let us know if you are going to restart your simvastatin or if you would like for Dr. Birdie Riddle to send in the prescription for zetia. Notes Recorded by Midge Minium, MD on 10/01/2014 at 4:38 PM Vit D is slightly low- please start daily OTC supplement of at least 2,000 units daily TSH is elevated- meaning your thyroid dose is too low. Based on this, increase to 164mcg daily and repeat TSH at lab only visit in 1 month Total cholesterol and LDL have jumped considerably. Based on this, would recommend restarting Simvastatin nightly. If you are concerned about memory issues, we could start Zetia instead

## 2015-05-02 NOTE — Telephone Encounter (Signed)
Medication filled to pharmacy as requested.   

## 2015-05-24 ENCOUNTER — Encounter: Payer: Self-pay | Admitting: Family Medicine

## 2015-08-31 ENCOUNTER — Other Ambulatory Visit: Payer: Self-pay | Admitting: Family Medicine

## 2015-08-31 ENCOUNTER — Encounter: Payer: Self-pay | Admitting: Family Medicine

## 2015-08-31 NOTE — Telephone Encounter (Signed)
Medication filled to pharmacy as requested.   

## 2015-09-07 ENCOUNTER — Encounter: Payer: BC Managed Care – PPO | Admitting: Family Medicine

## 2015-10-01 ENCOUNTER — Other Ambulatory Visit: Payer: Self-pay | Admitting: Family Medicine

## 2015-10-23 ENCOUNTER — Other Ambulatory Visit: Payer: Self-pay | Admitting: Family Medicine

## 2015-10-27 ENCOUNTER — Encounter: Payer: Self-pay | Admitting: Family Medicine

## 2015-10-28 ENCOUNTER — Encounter: Payer: Self-pay | Admitting: General Practice

## 2015-12-09 ENCOUNTER — Encounter: Payer: Self-pay | Admitting: Family Medicine

## 2015-12-09 ENCOUNTER — Ambulatory Visit (INDEPENDENT_AMBULATORY_CARE_PROVIDER_SITE_OTHER): Payer: BC Managed Care – PPO | Admitting: Family Medicine

## 2015-12-09 VITALS — BP 111/78 | HR 79 | Temp 98.1°F | Resp 16 | Ht 65.0 in | Wt 167.5 lb

## 2015-12-09 DIAGNOSIS — R8290 Unspecified abnormal findings in urine: Secondary | ICD-10-CM | POA: Diagnosis not present

## 2015-12-09 DIAGNOSIS — Z0000 Encounter for general adult medical examination without abnormal findings: Secondary | ICD-10-CM | POA: Diagnosis not present

## 2015-12-09 LAB — TSH: TSH: 0.5 m[IU]/L

## 2015-12-09 LAB — POCT URINALYSIS DIPSTICK
Bilirubin, UA: NEGATIVE
Glucose, UA: NEGATIVE
KETONES UA: NEGATIVE
Leukocytes, UA: NEGATIVE
Nitrite, UA: NEGATIVE
PH UA: 6
PROTEIN UA: NEGATIVE
RBC UA: NEGATIVE
SPEC GRAV UA: 1.02
UROBILINOGEN UA: 0.2

## 2015-12-09 LAB — CBC WITH DIFFERENTIAL/PLATELET
BASOS ABS: 53 {cells}/uL (ref 0–200)
Basophils Relative: 1 %
EOS ABS: 159 {cells}/uL (ref 15–500)
EOS PCT: 3 %
HCT: 38.5 % (ref 35.0–45.0)
HEMOGLOBIN: 12.9 g/dL (ref 11.7–15.5)
Lymphocytes Relative: 37 %
Lymphs Abs: 1961 cells/uL (ref 850–3900)
MCH: 31.6 pg (ref 27.0–33.0)
MCHC: 33.5 g/dL (ref 32.0–36.0)
MCV: 94.4 fL (ref 80.0–100.0)
MONOS PCT: 11 %
MPV: 11 fL (ref 7.5–12.5)
Monocytes Absolute: 583 cells/uL (ref 200–950)
NEUTROS ABS: 2544 {cells}/uL (ref 1500–7800)
NEUTROS PCT: 48 %
PLATELETS: 278 10*3/uL (ref 140–400)
RBC: 4.08 MIL/uL (ref 3.80–5.10)
RDW: 12.4 % (ref 11.0–15.0)
WBC: 5.3 10*3/uL (ref 3.8–10.8)

## 2015-12-09 NOTE — Progress Notes (Signed)
   Subjective:    Patient ID: Marcia Adams, female    DOB: [birth year], 60 y.o.   MRN: [id number]  HPI CPE- pt is UTD on colonoscopy, mammo is scheduled for next week.  Pap is scheduled for January w/ Dr Lynnette Caffey.  UTD on flu and Tdap.   Review of Systems Patient reports no vision/ hearing changes, adenopathy,fever, weight change,  persistant/recurrent hoarseness , swallowing issues, chest pain, palpitations, edema, persistant/recurrent cough, hemoptysis, dyspnea (rest/exertional/paroxysmal nocturnal), gastrointestinal bleeding (melena, rectal bleeding), abdominal pain, significant heartburn, bowel changes, Gyn symptoms (abnormal  bleeding, pain),  syncope, focal weakness, memory loss, numbness & tingling, skin/hair/nail changes, abnormal bruising or bleeding, anxiety, or depression.   + ammonia smell in urine x1 week    Objective:   Physical Exam General Appearance:    Alert, cooperative, no distress, appears stated age  Head:    Normocephalic, without obvious abnormality, atraumatic  Eyes:    PERRL, conjunctiva/corneas clear, EOM's intact, fundi    benign, both eyes  Ears:    Normal TM's and external ear canals, both ears  Nose:   Nares normal, septum midline, mucosa normal, no drainage    or sinus tenderness  Throat:   Lips, mucosa, and tongue normal; teeth and gums normal  Neck:   Supple, symmetrical, trachea midline, no adenopathy;    Thyroid: no enlargement/tenderness/nodules  Back:     Symmetric, no curvature, ROM normal, no CVA tenderness  Lungs:     Clear to auscultation bilaterally, respirations unlabored  Chest Wall:    No tenderness or deformity   Heart:    Regular rate and rhythm, S1 and S2 normal, no murmur, rub   or gallop  Breast Exam:    Deferred to GYN  Abdomen:     Soft, non-tender, bowel sounds active all four quadrants,    no masses, no organomegaly  Genitalia:    Deferred to GYN  Rectal:    Extremities:   Extremities normal, atraumatic, no cyanosis or edema    Pulses:   2+ and symmetric all extremities  Skin:   Skin color, texture, turgor normal, no rashes or lesions  Lymph nodes:   Cervical, supraclavicular, and axillary nodes normal  Neurologic:   CNII-XII intact, normal strength, sensation and reflexes    throughout          Assessment & Plan:  Physical exam- pt's PE WNL w/ exception of being overweight.  Check labs- including UA.  UTD on pap, mammo, colonoscopy, immunizations.  Anticipatory guidance provided.

## 2015-12-09 NOTE — Progress Notes (Signed)
Pre visit review using our clinic review tool, if applicable. No additional management support is needed unless otherwise documented below in the visit note. 

## 2015-12-09 NOTE — Patient Instructions (Signed)
Follow up in 6 months to recheck BP and cholesterol We'll notify you of your lab results and make any changes if needed Continue to work on healthy diet and regular exercise- you can do it! Call with any questions or concerns Happy Holidays!!! 

## 2015-12-10 LAB — BASIC METABOLIC PANEL
BUN: 20 mg/dL (ref 7–25)
CO2: 21 mmol/L (ref 20–31)
Calcium: 9.8 mg/dL (ref 8.6–10.4)
Chloride: 106 mmol/L (ref 98–110)
Creat: 1.28 mg/dL — ABNORMAL HIGH (ref 0.50–0.99)
GLUCOSE: 89 mg/dL (ref 65–99)
Potassium: 4.1 mmol/L (ref 3.5–5.3)
Sodium: 140 mmol/L (ref 135–146)

## 2015-12-10 LAB — LIPID PANEL
Cholesterol: 191 mg/dL (ref 125–200)
HDL: 63 mg/dL (ref >=46)
LDL CALC: 111 mg/dL (ref <130)
TRIGLYCERIDES: 85 mg/dL (ref <150)
Total CHOL/HDL Ratio: 3 Ratio (ref <=5.0)
VLDL: 17 mg/dL (ref <30)

## 2015-12-10 LAB — HEPATIC FUNCTION PANEL
ALT: 14 U/L (ref 6–29)
AST: 22 U/L (ref 10–35)
Albumin: 4.1 g/dL (ref 3.6–5.1)
Alkaline Phosphatase: 80 U/L (ref 33–130)
Bilirubin, Direct: 0.1 mg/dL
Indirect Bilirubin: 0.5 mg/dL (ref 0.2–1.2)
Total Bilirubin: 0.6 mg/dL (ref 0.2–1.2)
Total Protein: 6.3 g/dL (ref 6.1–8.1)

## 2015-12-10 LAB — VITAMIN D 25 HYDROXY (VIT D DEFICIENCY, FRACTURES): Vit D, 25-Hydroxy: 60 ng/mL (ref 30–100)

## 2015-12-11 LAB — URINE CULTURE

## 2015-12-12 ENCOUNTER — Other Ambulatory Visit: Payer: Self-pay | Admitting: General Practice

## 2015-12-12 DIAGNOSIS — R7989 Other specified abnormal findings of blood chemistry: Secondary | ICD-10-CM

## 2015-12-16 ENCOUNTER — Telehealth: Payer: Self-pay | Admitting: Family Medicine

## 2015-12-16 ENCOUNTER — Encounter: Payer: Self-pay | Admitting: Family Medicine

## 2015-12-16 MED ORDER — CEPHALEXIN 500 MG PO CAPS: 500.0000 mg | ORAL_CAPSULE | Freq: Two times a day (BID) | ORAL | 0 refills | Status: DC

## 2015-12-16 NOTE — Telephone Encounter (Signed)
Pt states that she is still have uti symptoms and asking what should she do.

## 2015-12-16 NOTE — Telephone Encounter (Signed)
She can have Keflex 500mg  BID x5 days but if no improvement in sxs, will need a urology referral

## 2015-12-16 NOTE — Telephone Encounter (Signed)
Please advise, per last Urine Culture pt is to repeat lab in 1 month.

## 2015-12-16 NOTE — Telephone Encounter (Signed)
Patient notified of PCP recommendations and is agreement and expresses an understanding. Med filled to local pharmacy.

## 2015-12-19 ENCOUNTER — Encounter: Payer: Self-pay | Admitting: Family Medicine

## 2015-12-21 ENCOUNTER — Encounter: Payer: Self-pay | Admitting: Family Medicine

## 2015-12-21 ENCOUNTER — Other Ambulatory Visit: Payer: Self-pay | Admitting: Family Medicine

## 2015-12-21 DIAGNOSIS — N200 Calculus of kidney: Secondary | ICD-10-CM

## 2015-12-21 DIAGNOSIS — K5732 Diverticulitis of large intestine without perforation or abscess without bleeding: Secondary | ICD-10-CM

## 2016-01-04 ENCOUNTER — Encounter: Payer: Self-pay | Admitting: Family Medicine

## 2016-01-07 ENCOUNTER — Encounter: Payer: Self-pay | Admitting: Family Medicine

## 2016-01-13 ENCOUNTER — Other Ambulatory Visit: Payer: BC Managed Care – PPO

## 2016-01-20 ENCOUNTER — Encounter: Payer: Self-pay | Admitting: Family Medicine

## 2016-01-21 ENCOUNTER — Other Ambulatory Visit: Payer: Self-pay | Admitting: Family Medicine

## 2016-01-22 ENCOUNTER — Other Ambulatory Visit: Payer: Self-pay | Admitting: Family Medicine

## 2016-02-20 ENCOUNTER — Ambulatory Visit: Payer: BC Managed Care – PPO | Admitting: Gastroenterology

## 2016-03-07 ENCOUNTER — Encounter: Payer: Self-pay | Admitting: Family Medicine

## 2016-04-21 ENCOUNTER — Other Ambulatory Visit: Payer: Self-pay | Admitting: Family Medicine

## 2016-06-07 ENCOUNTER — Encounter: Payer: Self-pay | Admitting: Family Medicine

## 2016-06-07 ENCOUNTER — Ambulatory Visit (INDEPENDENT_AMBULATORY_CARE_PROVIDER_SITE_OTHER): Payer: BC Managed Care – PPO | Admitting: Family Medicine

## 2016-06-07 VITALS — BP 122/82 | HR 71 | Resp 16 | Ht 64.0 in | Wt 164.0 lb

## 2016-06-07 DIAGNOSIS — E785 Hyperlipidemia, unspecified: Secondary | ICD-10-CM

## 2016-06-07 DIAGNOSIS — I10 Essential (primary) hypertension: Secondary | ICD-10-CM | POA: Diagnosis not present

## 2016-06-07 DIAGNOSIS — E038 Other specified hypothyroidism: Secondary | ICD-10-CM

## 2016-06-07 DIAGNOSIS — L03011 Cellulitis of right finger: Secondary | ICD-10-CM | POA: Diagnosis not present

## 2016-06-07 DIAGNOSIS — Z23 Encounter for immunization: Secondary | ICD-10-CM | POA: Diagnosis not present

## 2016-06-07 LAB — CBC WITH DIFFERENTIAL/PLATELET
BASOS PCT: 2.6 % (ref 0.0–3.0)
Basophils Absolute: 0.2 10*3/uL — ABNORMAL HIGH (ref 0.0–0.1)
EOS PCT: 3.8 % (ref 0.0–5.0)
Eosinophils Absolute: 0.2 10*3/uL (ref 0.0–0.7)
HEMATOCRIT: 38.5 % (ref 36.0–46.0)
HEMOGLOBIN: 12.8 g/dL (ref 12.0–15.0)
LYMPHS PCT: 31.8 % (ref 12.0–46.0)
Lymphs Abs: 1.9 10*3/uL (ref 0.7–4.0)
MCHC: 33.3 g/dL (ref 30.0–36.0)
MCV: 95.9 fl (ref 78.0–100.0)
Monocytes Absolute: 0.7 10*3/uL (ref 0.1–1.0)
Monocytes Relative: 12.1 % — ABNORMAL HIGH (ref 3.0–12.0)
NEUTROS ABS: 3 10*3/uL (ref 1.4–7.7)
Neutrophils Relative %: 49.7 % (ref 43.0–77.0)
Platelets: 251 10*3/uL (ref 150.0–400.0)
RBC: 4.01 Mil/uL (ref 3.87–5.11)
RDW: 12.9 % (ref 11.5–15.5)
WBC: 6.1 10*3/uL (ref 4.0–10.5)

## 2016-06-07 LAB — HEPATIC FUNCTION PANEL
ALK PHOS: 93 U/L (ref 39–117)
ALT: 11 U/L (ref 0–35)
AST: 16 U/L (ref 0–37)
Albumin: 4.1 g/dL (ref 3.5–5.2)
BILIRUBIN DIRECT: 0.1 mg/dL (ref 0.0–0.3)
BILIRUBIN TOTAL: 0.5 mg/dL (ref 0.2–1.2)
Total Protein: 6.1 g/dL (ref 6.0–8.3)

## 2016-06-07 LAB — LIPID PANEL
CHOL/HDL RATIO: 3
Cholesterol: 187 mg/dL (ref 0–200)
HDL: 63 mg/dL (ref >39.00)
LDL Cholesterol: 108 mg/dL — ABNORMAL HIGH (ref 0–99)
NONHDL: 124.49
Triglycerides: 80 mg/dL (ref 0.0–149.0)
VLDL: 16 mg/dL (ref 0.0–40.0)

## 2016-06-07 LAB — BASIC METABOLIC PANEL
BUN: 18 mg/dL (ref 6–23)
CALCIUM: 10.1 mg/dL (ref 8.4–10.5)
CO2: 30 mEq/L (ref 19–32)
CREATININE: 0.98 mg/dL (ref 0.40–1.20)
Chloride: 104 mEq/L (ref 96–112)
GFR: 60.99 mL/min (ref >60.00)
Glucose, Bld: 97 mg/dL (ref 70–99)
Potassium: 4.4 mEq/L (ref 3.5–5.1)
SODIUM: 140 meq/L (ref 135–145)

## 2016-06-07 LAB — TSH: TSH: 0.56 u[IU]/mL (ref 0.35–4.50)

## 2016-06-07 MED ORDER — MUPIROCIN 2 % EX OINT: 1.0000 "application " | TOPICAL_OINTMENT | Freq: Two times a day (BID) | CUTANEOUS | 0 refills | Status: DC

## 2016-06-07 NOTE — Assessment & Plan Note (Signed)
Chronic problem.  Well controlled today on Spironolactone.  Asymptomatic.  Check labs.  No anticipated med changes.

## 2016-06-07 NOTE — Patient Instructions (Signed)
Schedule your complete physical in 6 months We'll notify you of your lab results and make any changes if needed Keep up the good work!  You look great! Apply the Mupirocin ointment twice daily- keep area covered if risk of contamination Call with any questions or concerns Happy Mother's Day!!!

## 2016-06-07 NOTE — Progress Notes (Signed)
Pre visit review using our clinic review tool, if applicable. No additional management support is needed unless otherwise documented below in the visit note. 

## 2016-06-07 NOTE — Progress Notes (Signed)
   Subjective:    Patient ID: Marcia Adams, female    DOB: 01/31/1956, 61 y.o.   MRN: [id number]  HPI HTN- chronic problem, on Spironolactone w/ good control.  No CP, SOB, HAs, visual changes, edema.  Hyperlipidemia- chronic problem, on Simvastatin daily.  Denies abd pain, N/V, myalgias  Hypothyroid- chronic problem, on Levothyroxine 160mcg daily.  Pt reports cold intolerance, fatigue- 'no energy'.  + brittle nails.    R 1st finger wound- tripped and dragged dorsal DIP against the ground.  She has small ulceration w/ surrounding redness.  Very painful.     Review of Systems For ROS see HPI     Objective:   Physical Exam  Constitutional: She is oriented to person, place, and time. She appears well-developed and well-nourished. No distress.  HENT:  Head: Normocephalic and atraumatic.  Eyes: Conjunctivae and EOM are normal. Pupils are equal, round, and reactive to light.  Neck: Normal range of motion. Neck supple. No thyromegaly present.  Cardiovascular: Normal rate, regular rhythm, normal heart sounds and intact distal pulses.   No murmur heard. Pulmonary/Chest: Effort normal and breath sounds normal. No respiratory distress.  Abdominal: Soft. She exhibits no distension. There is no tenderness.  Musculoskeletal: She exhibits no edema.  Lymphadenopathy:    She has no cervical adenopathy.  Neurological: She is alert and oriented to person, place, and time.  Skin: Skin is warm and dry. There is erythema (0.5 cm ulceration over R 1st DIP w/ mild surrounding redness and TTP).  Psychiatric: She has a normal mood and affect. Her behavior is normal.  Vitals reviewed.         Assessment & Plan:  Cellulitis- new.  sxs are mild.  Start Mupirocin topically.  Reviewed supportive care and red flags that should prompt return.  Pt expressed understanding and is in agreement w/ plan.

## 2016-06-07 NOTE — Addendum Note (Signed)
Addended by: Desmond Dike L on: 06/07/2016 11:00 AM   Modules accepted: Orders

## 2016-06-07 NOTE — Assessment & Plan Note (Signed)
Chronic problem, tolerating statin w/o difficulty.  Encouraged healthy diet and regular exercise.  Check labs.  Adjust meds prn  

## 2016-06-07 NOTE — Assessment & Plan Note (Signed)
Chronic problem.  Pt is currently having sxs consistent w/ hypothyroid.  Check labs.  Adjust meds prn

## 2016-06-08 ENCOUNTER — Encounter: Payer: Self-pay | Admitting: Family Medicine

## 2016-06-08 ENCOUNTER — Encounter: Payer: Self-pay | Admitting: General Practice

## 2016-06-11 LAB — HM MAMMOGRAPHY

## 2016-06-11 MED ORDER — LEVOTHYROXINE SODIUM 88 MCG PO TABS: 88.0000 ug | ORAL_TABLET | Freq: Every day | ORAL | 3 refills | Status: DC

## 2016-06-13 LAB — HM PAP SMEAR

## 2016-06-25 ENCOUNTER — Encounter: Payer: Self-pay | Admitting: Family Medicine

## 2016-06-26 ENCOUNTER — Encounter: Payer: Self-pay | Admitting: General Practice

## 2016-07-03 ENCOUNTER — Encounter: Payer: Self-pay | Admitting: Family Medicine

## 2016-07-17 ENCOUNTER — Ambulatory Visit: Payer: BC Managed Care – PPO | Admitting: Family Medicine

## 2016-07-21 ENCOUNTER — Other Ambulatory Visit: Payer: Self-pay | Admitting: Family Medicine

## 2016-07-30 ENCOUNTER — Encounter: Payer: Self-pay | Admitting: Family Medicine

## 2016-07-30 ENCOUNTER — Ambulatory Visit (INDEPENDENT_AMBULATORY_CARE_PROVIDER_SITE_OTHER): Payer: BC Managed Care – PPO | Admitting: Family Medicine

## 2016-07-30 VITALS — BP 121/81 | HR 71 | Temp 98.2°F | Resp 16 | Ht 64.0 in | Wt 159.5 lb

## 2016-07-30 DIAGNOSIS — E038 Other specified hypothyroidism: Secondary | ICD-10-CM | POA: Diagnosis not present

## 2016-07-30 LAB — TSH: TSH: 0.69 u[IU]/mL (ref 0.35–4.50)

## 2016-07-30 NOTE — Assessment & Plan Note (Signed)
Chronic problem.  Pt was being over treated and all of her sxs improved when we dropped her Levothyroxine dose to 61mcg daily.  Check labs.  Adjust meds prn

## 2016-07-30 NOTE — Progress Notes (Signed)
   Subjective:    Patient ID: Marcia Adams, female    DOB: 1955-02-24, 61 y.o.   MRN: [id number]  HPI Hypothyroid- chronic problem.  Pt's thyroid medication was decreased from 182mcg to 63mcg daily due to suppressed TSH.  Pt reports feeling 'good'.  Pt has lost 5 lbs since last visit.  Pt reports fatigue is better.  Dry skin has improved.  Denies palpitations or SOB.     Review of Systems For ROS see HPI     Objective:   Physical Exam  Constitutional: She is oriented to person, place, and time. She appears well-developed and well-nourished. No distress.  HENT:  Head: Normocephalic and atraumatic.  Eyes: Conjunctivae and EOM are normal. Pupils are equal, round, and reactive to light.  Neck: Normal range of motion. Neck supple. No thyromegaly present.  Cardiovascular: Normal rate, regular rhythm, normal heart sounds and intact distal pulses.   No murmur heard. Pulmonary/Chest: Effort normal and breath sounds normal. No respiratory distress.  Abdominal: Soft. She exhibits no distension. There is no tenderness.  Musculoskeletal: She exhibits no edema.  Lymphadenopathy:    She has no cervical adenopathy.  Neurological: She is alert and oriented to person, place, and time.  Skin: Skin is warm and dry.  Psychiatric: She has a normal mood and affect. Her behavior is normal.  Vitals reviewed.         Assessment & Plan:

## 2016-07-30 NOTE — Progress Notes (Signed)
Pre visit review using our clinic review tool, if applicable. No additional management support is needed unless otherwise documented below in the visit note. 

## 2016-07-30 NOTE — Patient Instructions (Signed)
Schedule your complete physical in November We'll notify you of your lab results and make any changes if needed Continue to work on healthy diet and regular exercise- you look great! Call with any questions or concerns Have a great summer!!!

## 2016-08-08 ENCOUNTER — Encounter: Payer: Self-pay | Admitting: Family Medicine

## 2016-08-09 ENCOUNTER — Other Ambulatory Visit (INDEPENDENT_AMBULATORY_CARE_PROVIDER_SITE_OTHER): Payer: Self-pay | Admitting: Otolaryngology

## 2016-08-09 ENCOUNTER — Encounter: Payer: Self-pay | Admitting: General Practice

## 2016-08-09 DIAGNOSIS — J329 Chronic sinusitis, unspecified: Secondary | ICD-10-CM

## 2016-08-13 ENCOUNTER — Ambulatory Visit
Admission: RE | Admit: 2016-08-13 | Discharge: 2016-08-13 | Disposition: A | Discharge status: Home / Self Care | Payer: BC Managed Care – PPO | Source: Ambulatory Visit | Attending: Otolaryngology | Admitting: Otolaryngology

## 2016-08-13 DIAGNOSIS — J329 Chronic sinusitis, unspecified: Secondary | ICD-10-CM

## 2016-09-07 ENCOUNTER — Encounter: Payer: Self-pay | Admitting: Family Medicine

## 2016-10-15 ENCOUNTER — Other Ambulatory Visit: Payer: Self-pay | Admitting: Family Medicine

## 2016-11-12 ENCOUNTER — Other Ambulatory Visit: Payer: Self-pay | Admitting: Family Medicine

## 2016-11-16 ENCOUNTER — Encounter: Payer: Self-pay | Admitting: Family Medicine

## 2016-11-16 ENCOUNTER — Other Ambulatory Visit: Payer: Self-pay | Admitting: Family Medicine

## 2016-12-06 ENCOUNTER — Emergency Department (HOSPITAL_BASED_OUTPATIENT_CLINIC_OR_DEPARTMENT_OTHER): Payer: BC Managed Care – PPO

## 2016-12-06 ENCOUNTER — Encounter (HOSPITAL_BASED_OUTPATIENT_CLINIC_OR_DEPARTMENT_OTHER): Payer: Self-pay

## 2016-12-06 ENCOUNTER — Emergency Department (HOSPITAL_BASED_OUTPATIENT_CLINIC_OR_DEPARTMENT_OTHER)
Admission: EM | Admit: 2016-12-06 | Discharge: 2016-12-06 | Disposition: A | Discharge status: Home / Self Care | Payer: BC Managed Care – PPO | Attending: Emergency Medicine | Admitting: Emergency Medicine

## 2016-12-06 DIAGNOSIS — Z79899 Other long term (current) drug therapy: Secondary | ICD-10-CM | POA: Insufficient documentation

## 2016-12-06 DIAGNOSIS — E785 Hyperlipidemia, unspecified: Secondary | ICD-10-CM | POA: Insufficient documentation

## 2016-12-06 DIAGNOSIS — R197 Diarrhea, unspecified: Secondary | ICD-10-CM

## 2016-12-06 DIAGNOSIS — R1084 Generalized abdominal pain: Secondary | ICD-10-CM

## 2016-12-06 DIAGNOSIS — I10 Essential (primary) hypertension: Secondary | ICD-10-CM | POA: Insufficient documentation

## 2016-12-06 DIAGNOSIS — E039 Hypothyroidism, unspecified: Secondary | ICD-10-CM | POA: Diagnosis not present

## 2016-12-06 DIAGNOSIS — D649 Anemia, unspecified: Secondary | ICD-10-CM | POA: Insufficient documentation

## 2016-12-06 DIAGNOSIS — R1031 Right lower quadrant pain: Secondary | ICD-10-CM | POA: Insufficient documentation

## 2016-12-06 LAB — CBC WITH DIFFERENTIAL/PLATELET
BASOS ABS: 0 10*3/uL (ref 0.0–0.1)
BASOS PCT: 0 %
Eosinophils Absolute: 0 10*3/uL (ref 0.0–0.7)
Eosinophils Relative: 0 %
HEMATOCRIT: 37.9 % (ref 36.0–46.0)
HEMOGLOBIN: 12.7 g/dL (ref 12.0–15.0)
LYMPHS PCT: 9 %
Lymphs Abs: 0.8 10*3/uL (ref 0.7–4.0)
MCH: 33.3 pg (ref 26.0–34.0)
MCHC: 33.5 g/dL (ref 30.0–36.0)
MCV: 99.5 fL (ref 78.0–100.0)
MONO ABS: 0.5 10*3/uL (ref 0.1–1.0)
Monocytes Relative: 6 %
NEUTROS ABS: 7.7 10*3/uL (ref 1.7–7.7)
NEUTROS PCT: 85 %
PLATELETS: 269 10*3/uL (ref 150–400)
RBC: 3.81 MIL/uL — ABNORMAL LOW (ref 3.87–5.11)
RDW: 12.2 % (ref 11.5–15.5)
WBC: 9 10*3/uL (ref 4.0–10.5)

## 2016-12-06 LAB — COMPREHENSIVE METABOLIC PANEL
ALBUMIN: 3.8 g/dL (ref 3.5–5.0)
ALT: 15 U/L (ref 14–54)
ANION GAP: 8 (ref 5–15)
AST: 25 U/L (ref 15–41)
Alkaline Phosphatase: 94 U/L (ref 38–126)
BUN: 27 mg/dL — AB (ref 6–20)
CHLORIDE: 107 mmol/L (ref 101–111)
CO2: 24 mmol/L (ref 22–32)
Calcium: 9.7 mg/dL (ref 8.9–10.3)
Creatinine, Ser: 1.28 mg/dL — ABNORMAL HIGH (ref 0.44–1.00)
GFR calc Af Amer: 50 mL/min — ABNORMAL LOW (ref >60)
GFR calc non Af Amer: 44 mL/min — ABNORMAL LOW (ref >60)
GLUCOSE: 141 mg/dL — AB (ref 65–99)
POTASSIUM: 4 mmol/L (ref 3.5–5.1)
SODIUM: 139 mmol/L (ref 135–145)
Total Bilirubin: 0.5 mg/dL (ref 0.3–1.2)
Total Protein: 6.6 g/dL (ref 6.5–8.1)

## 2016-12-06 LAB — LIPASE, BLOOD: Lipase: 19 U/L (ref 11–51)

## 2016-12-06 MED ORDER — ONDANSETRON 4 MG PO TBDP: 4.0000 mg | ORAL_TABLET | Freq: Three times a day (TID) | ORAL | 0 refills | Status: DC | PRN

## 2016-12-06 MED ORDER — IOPAMIDOL (ISOVUE-300) INJECTION 61%
100.0000 mL | Freq: Once | INTRAVENOUS | Status: AC | PRN
  Administered 2016-12-06: 80 mL via INTRAVENOUS

## 2016-12-06 MED ORDER — SODIUM CHLORIDE 0.9 % IV BOLUS (SEPSIS)
1000.0000 mL | Freq: Once | INTRAVENOUS | Status: AC
  Administered 2016-12-06: 1000 mL via INTRAVENOUS

## 2016-12-06 MED ORDER — ONDANSETRON HCL 4 MG/2ML IJ SOLN
4.0000 mg | Freq: Once | INTRAMUSCULAR | Status: AC
  Administered 2016-12-06: 4 mg via INTRAVENOUS
  Filled 2016-12-06: qty 2

## 2016-12-06 MED ORDER — LOPERAMIDE HCL 2 MG PO CAPS: 2.0000 mg | ORAL_CAPSULE | Freq: Four times a day (QID) | ORAL | 0 refills | Status: DC | PRN

## 2016-12-06 MED FILL — ONDANSETRON ODT 4 MG TABLET: 4 | 6 days supply | Qty: 18 | Fill #0

## 2016-12-06 NOTE — ED Notes (Signed)
ED Provider at bedside. 

## 2016-12-06 NOTE — ED Provider Notes (Signed)
[facility] Provider Note: Georgena Spurling, MD, FACEP  CSN: [id number] MRN: [id number] ARRIVAL: 12/06/16 at [facility]: MHOTF/OTF   CHIEF COMPLAINT  Abdominal Pain   HISTORY OF PRESENT ILLNESS  12/06/16 5:39 AM Marcia Adams is a 61 y.o. female with a history of diverticulitis.  She is here with vague abdominal pain that began 2 evenings ago.  Yesterday morning about 10 AM she developed profuse watery diarrhea which lasted until about 11 PM yesterday evening.  About 9 PM yesterday evening she developed nausea and vomiting which persisted until about 1 AM today.  Although the vomiting has improved she is still unable to drink fluids without vomiting.  She developed severe right-sided abdominal pain this morning and about 4 AM it was severe enough she called EMS.  She rated her pain as a 6 out of 10 when EMS arrived but the pain has improved and she now rates it a 3 out of 10.  She states the pain feels like a soreness in her abdomen like she has had a workout.    She reports 2 episodes of diverticulitis in the past (2014 and 2015).  On the first episode she had no associated pain.  On the second episode she had severe lower associated pain.  Review of her CTs from these episodes show that she more likely had colitis rather than diverticulitis.  Nevertheless she feels that this is a recurrence of what she had previously.   Past Medical History:  Diagnosis Date  . Allergic rhinitis   . Allergy   . Anemia   . Anxiety   . Depression   . Diverticulitis   . Esophageal stricture   . GERD (gastroesophageal reflux disease)   . Heart murmur    mild since birth  . Hiatal hernia   . Hyperlipidemia   . Hypertension   . Hypothyroidism   . Insomnia   . Kidney stones   . Menopause   . Renal insufficiency   . Vitamin D deficiency     Past Surgical History:  Procedure Laterality Date  . HEMORRHOID SURGERY    . MASS EXCISION  06/08/2011   Procedure: MINOR EXCISION OF MASS;   Surgeon: Cammie Sickle., MD;  Location: [facility];  Service: Orthopedics;  Laterality: Right;  excisional biopsy dorsal right hand  . NASAL SINUS SURGERY  7/99  . NISSEN FUNDOPLICATION  1/51  . 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 RETINAL DETACHMENT SURGERY     Gas bubble in center of right eye.  Pt not to lye completely flat.  Marland Kitchen STOMACH SURGERY    . UPPER GASTROINTESTINAL ENDOSCOPY      Family History  Problem Relation Age of Onset  . COPD Mother   . Heart failure Mother   . COPD Father   . Heart failure Father   . Depression Brother   . Urolithiasis Brother   . Arthritis Maternal Grandmother   . Kidney cancer Maternal Grandfather   . Eczema Daughter   . Colon cancer Neg Hx   . Esophageal cancer Neg Hx   . Rectal cancer Neg Hx   . Stomach cancer Neg Hx     Social History  Substance Use Topics  . Smoking status: Never Smoker  . Smokeless tobacco: Never Used  . Alcohol use Yes     Comment: occasionally    Prior to Admission medications   Medication Sig Start Date End Date Taking? Authorizing Provider  Azelastine-Fluticasone (DYMISTA)  137-50 MCG/ACT SUSP Place 2 sprays into the nose 2 (two) times daily.    [provider]  buPROPion (WELLBUTRIN XL) 150 MG 24 hr tablet Take 450 mg by mouth daily.     [provider]  cetirizine (ZYRTEC) 10 MG tablet Take 10 mg by mouth daily.    [provider]  chlordiazePOXIDE (LIBRIUM) 5 MG capsule  06/06/16   [provider]  clindamycin (CLINDAGEL) 1 % gel Apply topically daily.     [provider]  EPIPEN 2-PAK 0.3 MG/0.3ML DEVI Inject 0.3 mg into the muscle daily as needed (allergic reaction).  06/02/11   [provider]  Estradiol 10 MCG TABS vaginal tablet  03/30/16   [provider]  Eszopiclone 3 MG TABS Take 3 mg by mouth at bedtime. Take immediately before bedtime    [provider]  guaiFENesin (MUCINEX) 600 MG 12 hr tablet Take 1,200 mg by  mouth 2 (two) times daily.    [provider]  ipratropium (ATROVENT) 0.06 % nasal spray Place 2 sprays into both nostrils 4 (four) times daily.    [provider]  levothyroxine (SYNTHROID, LEVOTHROID) 88 MCG tablet TAKE 1 TABLET(88 MCG) BY MOUTH DAILY 11/19/16   Midge Minium, MD  loperamide (IMODIUM) 2 MG capsule Take 1 capsule (2 mg total) by mouth 4 (four) times daily as needed for diarrhea or loose stools. 12/06/16   Long, Wonda Olds, MD  loratadine (CLARITIN) 10 MG tablet Take 10 mg by mouth at bedtime.     [provider]  montelukast (SINGULAIR) 10 MG tablet TK 1 T PO QD IN THE EVE 07/22/16   [provider]  mupirocin ointment (BACTROBAN) 2 % Apply 1 application topically 2 (two) times daily. 06/07/16   Midge Minium, MD  ondansetron (ZOFRAN ODT) 4 MG disintegrating tablet Take 1 tablet (4 mg total) by mouth every 8 (eight) hours as needed for nausea or vomiting. 12/06/16   Long, Wonda Olds, MD  Polyethylene Glycol 3350 (MIRALAX PO) Take by mouth. Pt takes by mouth twice weekly.    [provider]  simvastatin (ZOCOR) 10 MG tablet TAKE 1 TABLET(10 MG) BY MOUTH AT BEDTIME 11/13/16   Midge Minium, MD  spironolactone (ALDACTONE) 50 MG tablet Take 50 mg by mouth daily.    [provider]  tazarotene (AVAGE) 0.1 % cream Apply topically at bedtime.    [provider]  Vortioxetine HBr (TRINTELLIX) 20 MG TABS Take 20 mg by mouth daily.    [provider]    Allergies Ciprofloxacin; Aripiprazole; Escitalopram oxalate; and Enalapril   REVIEW OF SYSTEMS  Negative except as noted here or in the History of Present Illness.   PHYSICAL EXAMINATION  Initial Vital Signs Blood pressure 136/89, pulse 96, temperature 98.5 F (36.9 C), temperature source Oral, resp. rate 18, height 5\' 4"  (1.626 m), weight 73.5 kg (162 lb), SpO2 99 %.  Examination General: Well-developed, well-nourished female in no acute distress;  appearance consistent with age of record HENT: normocephalic; atraumatic Eyes: pupils equal, round and reactive to light; extraocular muscles intact Neck: supple Heart: regular rate and rhythm Lungs: clear to auscultation bilaterally Abdomen: soft; nondistended; mild upper abdominal tenderness; no masses or hepatosplenomegaly; bowel sounds present Extremities: No deformity; full range of motion; pulses normal Neurologic: Awake, alert and oriented; motor function intact in all extremities and symmetric; no facial droop Skin: Warm and dry Psychiatric: Normal mood and affect   RESULTS  Summary of this  visit's results, reviewed by myself:   EKG Interpretation  Date/Time:    Ventricular Rate:    PR Interval:    QRS Duration:   QT Interval:    QTC Calculation:   R Axis:     Text Interpretation:        Laboratory Studies: Results for orders placed or performed during the hospital encounter of 12/06/16 (from the past 24 hour(s))  CBC with Differential/Platelet     Status: Abnormal   Collection Time: 12/06/16  6:00 AM  Result Value Ref Range   WBC 9.0 4.0 - 10.5 K/uL   RBC 3.81 (L) 3.87 - 5.11 MIL/uL   Hemoglobin 12.7 12.0 - 15.0 g/dL   HCT 37.9 36.0 - 46.0 %   MCV 99.5 78.0 - 100.0 fL   MCH 33.3 26.0 - 34.0 pg   MCHC 33.5 30.0 - 36.0 g/dL   RDW 12.2 11.5 - 15.5 %   Platelets 269 150 - 400 K/uL   Neutrophils Relative % 85 %   Neutro Abs 7.7 1.7 - 7.7 K/uL   Lymphocytes Relative 9 %   Lymphs Abs 0.8 0.7 - 4.0 K/uL   Monocytes Relative 6 %   Monocytes Absolute 0.5 0.1 - 1.0 K/uL   Eosinophils Relative 0 %   Eosinophils Absolute 0.0 0.0 - 0.7 K/uL   Basophils Relative 0 %   Basophils Absolute 0.0 0.0 - 0.1 K/uL  Comprehensive metabolic panel     Status: Abnormal   Collection Time: 12/06/16  6:00 AM  Result Value Ref Range   Sodium 139 135 - 145 mmol/L   Potassium 4.0 3.5 - 5.1 mmol/L   Chloride 107 101 - 111 mmol/L   CO2 24 22 - 32 mmol/L   Glucose, Bld 141 (H) 65 - 99  mg/dL   BUN 27 (H) 6 - 20 mg/dL   Creatinine, Ser 1.28 (H) 0.44 - 1.00 mg/dL   Calcium 9.7 8.9 - 10.3 mg/dL   Total Protein 6.6 6.5 - 8.1 g/dL   Albumin 3.8 3.5 - 5.0 g/dL   AST 25 15 - 41 U/L   ALT 15 14 - 54 U/L   Alkaline Phosphatase 94 38 - 126 U/L   Total Bilirubin 0.5 0.3 - 1.2 mg/dL   GFR calc non Af Amer 44 (L) >60 mL/min   GFR calc Af Amer 50 (L) >60 mL/min   Anion gap 8 5 - 15  Lipase, blood     Status: None   Collection Time: 12/06/16  6:00 AM  Result Value Ref Range   Lipase 19 11 - 51 U/L   Imaging Studies: Ct Abdomen Pelvis W Contrast  Result Date: 12/06/2016 CLINICAL DATA:  61 year old female with a history of epigastric pain EXAM: CT ABDOMEN AND PELVIS WITH CONTRAST TECHNIQUE: Multidetector CT imaging of the abdomen and pelvis was performed using the standard protocol following bolus administration of intravenous contrast. CONTRAST:  20mL ISOVUE-300 IOPAMIDOL (ISOVUE-300) INJECTION 61% COMPARISON:  CT 11/22/2013 FINDINGS: Lower chest: Similar configuration of hiatal hernia with surgical changes at the GE junction in this patient with a history of Nissen fundoplication. Air-fluid level within the distal esophagus. Hepatobiliary: Unremarkable appearance of liver parenchyma. Unremarkable gallbladder. Pancreas: Unremarkable pancreas Spleen: Unremarkable spleen Adrenals/Urinary Tract: Unremarkable adrenal glands. Unremarkable appearance of the left kidney with no hydronephrosis or nephrolithiasis. Unremarkable course of the left ureter. The right kidney again demonstrates low-density cyst on the lateral cortex measuring 3.2 cm compatible with Bosniak 1 cyst. No hydronephrosis or nephrolithiasis. Unremarkable  course of the right ureter. Unremarkable urinary bladder. Stomach/Bowel: Hiatal hernia with surgical changes at the GE junction again demonstrated. No evidence of gastric outlet obstruction. There is retained air-fluid level in the distal esophagus. No abnormally dilated small  bowel. No transition point. No focal wall thickening. Enteric contrast reaches the colon. Normal appendix. No significant stool burden. No distended colon or focal wall thickening. Extensive diverticular disease, predominantly within the descending and sigmoid colon. No associated inflammatory changes. Vascular/Lymphatic: No aneurysm or dissection. Mild atherosclerotic changes of the aorta. Bilateral iliac and femoral vasculature patent. Unremarkable appearance of the venous system. Reproductive: Unremarkable pelvic structures. Other: Small volume of low-density free fluid within the anatomic pelvis, un certain etiology. Musculoskeletal: No acute displaced fracture. No bony canal narrowing. Multilevel degenerative changes of the spine. Mild degenerative changes of the hips. IMPRESSION: Small volume low-density free fluid within the anatomic pelvis, nonspecific etiology. Unlikely to represent physiologic fluid given the patient's age, and this may reflect nonspecific enteritis/nonspecific colitis. Pertinent negatives include no CT evidence of acute diverticulitis or bowel obstruction. 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 Associated air-fluid level in the distal esophagus. Diverticular disease without evidence of acute diverticulitis. These results were called by telephone at the time of interpretation on 12/06/2016 at 9:00 am to Dr. Laverta Baltimore, who verbally acknowledged these results. Electronically Signed   By: Corrie Mckusick D.O.   On: 12/06/2016 09:07    ED COURSE  Nursing notes and initial vitals signs, including pulse oximetry, reviewed.  Vitals:   12/06/16 0502 12/06/16 0504 12/06/16 0804  BP:  136/89 114/77  Pulse: 96  82  Resp: 18  18  Temp: 98.5 F (36.9 C)  98.3 F (36.8 C)  TempSrc: Oral  Oral  SpO2: 99%  98%  Weight:  73.5 kg (162 lb)   Height:  5\' 4"  (1.626 m)     PROCEDURES    ED DIAGNOSES     ICD-10-CM   1. Generalized abdominal pain R10.84   2. Diarrhea,  unspecified type R19.7        Shanon Rosser, MD 12/06/16 2251

## 2016-12-06 NOTE — Discharge Instructions (Signed)
You have been seen in the Emergency Department (ED) for abdominal pain.  Your evaluation did not identify a clear cause of your symptoms but was generally reassuring.  Your CT scan did not any colitis or diverticulitis. You likely have a viral infection that will resolve with supportive care.   Your Nissen also appears to have slipped. You should discuss this with your PCP and Gastroenterologist as this can cause problems in the future but does not appear to be causing your current symptoms.   Please follow up as instructed above regarding today?s emergent visit and the symptoms that are bothering you.  Return to the ED if your abdominal pain worsens or fails to improve, you develop bloody vomiting, bloody diarrhea, you are unable to tolerate fluids due to vomiting, fever greater than 101, or other symptoms that concern you.

## 2016-12-06 NOTE — ED Triage Notes (Signed)
Pt here [facility] from home with the c/o RLQ pain associated with N/V/D. Pt has hx of diverticulitis feels this is the same.

## 2016-12-06 NOTE — ED Provider Notes (Signed)
Blood pressure 136/89, pulse 96, temperature 98.5 F (36.9 C), temperature source Oral, resp. rate 18, height 5\' 4"  (1.626 m), weight 73.5 kg (162 lb), SpO2 99 %.  Assuming care from Dr. Florina Ou.  In short, Marcia Adams is a 61 y.o. female with a chief complaint of Abdominal Pain .  Refer to the original H&P for additional details.  The current plan of care is to follow CT and reassess.  09:04 AM Called by radiology to discuss the CT scan.  No evidence of colitis or diverticulitis.  There is some reactive free fluid in the pelvis that is likely from an enteritis.  No bowel obstruction.  Patient has a history of a Nissen and the radiologist states that the Nissen seems to have slipped.  Doubt this is causing her current symptoms.  I have discussed this with the patient and she will follow with her gastroenterologist.  The provider he did the procedure has since retired but she continues to follow with that group.  Discussed supportive care at home with fluids.  Plan to discharge home with Zofran and Imodium.   At this time, I do not feel there is any life-threatening condition present. I have reviewed and discussed all results (EKG, imaging, lab, urine as appropriate), exam findings with patient. I have reviewed nursing notes and appropriate previous records.  I feel the patient is safe to be discharged home without further emergent workup. Discussed usual and customary return precautions. Patient and family (if present) verbalize understanding and are comfortable with this plan.  Patient will follow-up with their primary care provider. If they do not have a primary care provider, information for follow-up has been provided to them. All questions have been answered.  Nanda Quinton, MD   Margette Fast, MD 12/06/16 [phone]

## 2016-12-10 ENCOUNTER — Encounter: Payer: Self-pay | Admitting: Family Medicine

## 2016-12-10 ENCOUNTER — Ambulatory Visit (INDEPENDENT_AMBULATORY_CARE_PROVIDER_SITE_OTHER): Payer: BC Managed Care – PPO | Admitting: Family Medicine

## 2016-12-10 VITALS — BP 120/82 | HR 80 | Temp 99.1°F | Resp 16 | Ht 64.0 in | Wt 167.2 lb

## 2016-12-10 DIAGNOSIS — Z0000 Encounter for general adult medical examination without abnormal findings: Secondary | ICD-10-CM | POA: Diagnosis not present

## 2016-12-10 DIAGNOSIS — E785 Hyperlipidemia, unspecified: Secondary | ICD-10-CM | POA: Diagnosis not present

## 2016-12-10 DIAGNOSIS — Z23 Encounter for immunization: Secondary | ICD-10-CM

## 2016-12-10 DIAGNOSIS — I10 Essential (primary) hypertension: Secondary | ICD-10-CM | POA: Diagnosis not present

## 2016-12-10 LAB — CBC WITH DIFFERENTIAL/PLATELET
BASOS PCT: 1.1 % (ref 0.0–3.0)
Basophils Absolute: 0.1 10*3/uL (ref 0.0–0.1)
EOS ABS: 0.2 10*3/uL (ref 0.0–0.7)
EOS PCT: 3.6 % (ref 0.0–5.0)
HCT: 40.1 % (ref 36.0–46.0)
Hemoglobin: 13.7 g/dL (ref 12.0–15.0)
Lymphocytes Relative: 30.4 % (ref 12.0–46.0)
Lymphs Abs: 1.9 10*3/uL (ref 0.7–4.0)
MCHC: 34.1 g/dL (ref 30.0–36.0)
MCV: 100.2 fl — ABNORMAL HIGH (ref 78.0–100.0)
MONO ABS: 0.6 10*3/uL (ref 0.1–1.0)
Monocytes Relative: 10.3 % (ref 3.0–12.0)
NEUTROS ABS: 3.4 10*3/uL (ref 1.4–7.7)
Neutrophils Relative %: 54.6 % (ref 43.0–77.0)
PLATELETS: 264 10*3/uL (ref 150.0–400.0)
RBC: 4 Mil/uL (ref 3.87–5.11)
RDW: 13.4 % (ref 11.5–15.5)
WBC: 6.2 10*3/uL (ref 4.0–10.5)

## 2016-12-10 LAB — BASIC METABOLIC PANEL
BUN: 21 mg/dL (ref 6–23)
CHLORIDE: 104 meq/L (ref 96–112)
CO2: 26 meq/L (ref 19–32)
Calcium: 10.1 mg/dL (ref 8.4–10.5)
Creatinine, Ser: 1.3 mg/dL — ABNORMAL HIGH (ref 0.40–1.20)
GFR: 43.95 mL/min — ABNORMAL LOW (ref >60.00)
Glucose, Bld: 96 mg/dL (ref 70–99)
POTASSIUM: 4 meq/L (ref 3.5–5.1)
SODIUM: 140 meq/L (ref 135–145)

## 2016-12-10 LAB — HEPATIC FUNCTION PANEL
ALK PHOS: 89 U/L (ref 39–117)
ALT: 13 U/L (ref 0–35)
AST: 20 U/L (ref 0–37)
Albumin: 4.1 g/dL (ref 3.5–5.2)
BILIRUBIN DIRECT: 0.1 mg/dL (ref 0.0–0.3)
BILIRUBIN TOTAL: 0.5 mg/dL (ref 0.2–1.2)
Total Protein: 6.4 g/dL (ref 6.0–8.3)

## 2016-12-10 LAB — LIPID PANEL
Cholesterol: 186 mg/dL (ref 0–200)
HDL: 63.6 mg/dL (ref >39.00)
LDL Cholesterol: 101 mg/dL — ABNORMAL HIGH (ref 0–99)
NONHDL: 122.44
Total CHOL/HDL Ratio: 3
Triglycerides: 107 mg/dL (ref 0.0–149.0)
VLDL: 21.4 mg/dL (ref 0.0–40.0)

## 2016-12-10 LAB — TSH: TSH: 3.17 u[IU]/mL (ref 0.35–4.50)

## 2016-12-10 NOTE — Progress Notes (Signed)
   Subjective:    Patient ID: Marcia Adams, female    DOB: Jun 05, 1955, 61 y.o.   MRN: [id number]  HPI CPE- UTD on pap, mammo, Colonoscopy, flu shot, Tdap.  Pt to get 2nd Shingles shot today.   Review of Systems Patient reports no vision/ hearing changes, adenopathy,fever, weight change,  persistant/recurrent hoarseness , swallowing issues, chest pain, palpitations, edema, persistant/recurrent cough, hemoptysis, dyspnea (rest/exertional/paroxysmal nocturnal), gastrointestinal bleeding (melena, rectal bleeding), abdominal pain, significant heartburn, bowel changes, GU symptoms (dysuria, hematuria, incontinence), Gyn symptoms (abnormal  bleeding, pain),  syncope, focal weakness, memory loss, numbness & tingling, skin/hair/nail changes, abnormal bruising or bleeding, anxiety, or depression.     Objective:   Physical Exam General Appearance:    Alert, cooperative, no distress, appears stated age  Head:    Normocephalic, without obvious abnormality, atraumatic  Eyes:    PERRL, conjunctiva/corneas clear, EOM's intact, fundi    benign, both eyes  Ears:    Normal TM's and external ear canals, both ears  Nose:   Nares normal, septum midline, mucosa normal, no drainage    or sinus tenderness  Throat:   Lips, mucosa, and tongue normal; teeth and gums normal  Neck:   Supple, symmetrical, trachea midline, no adenopathy;    Thyroid: no enlargement/tenderness/nodules  Back:     Symmetric, no curvature, ROM normal, no CVA tenderness  Lungs:     Clear to auscultation bilaterally, respirations unlabored  Chest Wall:    No tenderness or deformity   Heart:    Regular rate and rhythm, S1 and S2 normal, no murmur, rub   or gallop  Breast Exam:    Deferred to GYN  Abdomen:     Soft, non-tender, bowel sounds active all four quadrants,    no masses, no organomegaly  Genitalia:    Deferred to GYN  Rectal:    Extremities:   Extremities normal, atraumatic, no cyanosis or edema  Pulses:   2+ and symmetric all  extremities  Skin:   Skin color, texture, turgor normal, no rashes or lesions  Lymph nodes:   Cervical, supraclavicular, and axillary nodes normal  Neurologic:   CNII-XII intact, normal strength, sensation and reflexes    throughout          Assessment & Plan:

## 2016-12-10 NOTE — Assessment & Plan Note (Signed)
Chronic problem.  Tolerating statin w/o difficulty.  Check labs.  Adjust meds prn  

## 2016-12-10 NOTE — Patient Instructions (Signed)
Follow up in 6 months to recheck BP and cholesterol We'll notify you of your lab results and make any changes if needed Continue to work on healthy diet and regular exercise- you can do it! Call with any questions or concerns Happy Fall!!! 

## 2016-12-10 NOTE — Assessment & Plan Note (Signed)
Pt's PE WNL w/ exception of being overweight.  UTD on colonoscopy, mammo, pap, and immunizations.  Check labs.  Anticipatory guidance provided.

## 2016-12-10 NOTE — Assessment & Plan Note (Signed)
Chronic problem.  Adequate control today.  Asymptomatic.  Check labs.  No anticipated med changes.  Will follow. 

## 2016-12-10 NOTE — Addendum Note (Signed)
Addended by: Davis Gourd on: 12/10/2016 10:58 AM   Modules accepted: Orders

## 2016-12-11 ENCOUNTER — Encounter: Payer: Self-pay | Admitting: Family Medicine

## 2016-12-11 ENCOUNTER — Other Ambulatory Visit: Payer: Self-pay | Admitting: Family Medicine

## 2016-12-11 DIAGNOSIS — R7989 Other specified abnormal findings of blood chemistry: Secondary | ICD-10-CM

## 2016-12-16 ENCOUNTER — Encounter: Payer: Self-pay | Admitting: Family Medicine

## 2016-12-17 ENCOUNTER — Telehealth: Payer: Self-pay | Admitting: Family Medicine

## 2016-12-18 ENCOUNTER — Other Ambulatory Visit: Payer: BC Managed Care – PPO

## 2016-12-24 ENCOUNTER — Other Ambulatory Visit: Payer: BC Managed Care – PPO

## 2016-12-28 ENCOUNTER — Encounter: Payer: Self-pay | Admitting: Family Medicine

## 2017-01-03 NOTE — Telephone Encounter (Signed)
Mistake.

## 2017-02-28 ENCOUNTER — Other Ambulatory Visit: Payer: Self-pay

## 2017-02-28 ENCOUNTER — Encounter: Payer: Self-pay | Admitting: Family Medicine

## 2017-02-28 ENCOUNTER — Ambulatory Visit: Payer: BC Managed Care – PPO | Admitting: Family Medicine

## 2017-02-28 VITALS — BP 123/90 | HR 80 | Temp 98.1°F | Resp 17 | Ht 64.0 in | Wt 173.0 lb

## 2017-02-28 DIAGNOSIS — R0781 Pleurodynia: Secondary | ICD-10-CM

## 2017-02-28 DIAGNOSIS — R7989 Other specified abnormal findings of blood chemistry: Secondary | ICD-10-CM | POA: Diagnosis not present

## 2017-02-28 LAB — BASIC METABOLIC PANEL
BUN: 24 mg/dL — ABNORMAL HIGH (ref 6–23)
CALCIUM: 9.5 mg/dL (ref 8.4–10.5)
CO2: 26 mEq/L (ref 19–32)
Chloride: 104 mEq/L (ref 96–112)
Creatinine, Ser: 1.21 mg/dL — ABNORMAL HIGH (ref 0.40–1.20)
GFR: 47.71 mL/min — AB (ref >60.00)
Glucose, Bld: 90 mg/dL (ref 70–99)
Potassium: 4.8 mEq/L (ref 3.5–5.1)
SODIUM: 139 meq/L (ref 135–145)

## 2017-02-28 MED ORDER — CYCLOBENZAPRINE HCL 10 MG PO TABS
10.0000 mg | ORAL_TABLET | Freq: Three times a day (TID) | ORAL | 0 refills | Status: DC | PRN
Start: 2017-02-28 — End: 2017-06-12

## 2017-02-28 NOTE — Progress Notes (Signed)
   Subjective:    Patient ID: Marcia Adams, female    DOB: [birth year], 62 y.o.   MRN: [id number]  HPI Rib pain- pt reports that in early December she bent down and twisted to look at something under the counter and she developed L anterior rib pain under her breast.  Pain lasted x5 days and 'it was gone'.  Saturday was on the floor with her grandson and twisted to the L when she felt a 'pop' and developed the same pain.  Tuesday night she had to take out trash and recycling so yesterday she was not able to get out of bed due to pain and spasm.  Unable to raise arms w/o a 'pulling'.  'it's so much better today'.  Painful to cough/laugh.  No relief w/ heating pad.  No relief w/ Tramadol yesterday, no relief w/ Ibuprofen 800mg .   Review of Systems For ROS see HPI     Objective:   Physical Exam  Constitutional: She is oriented to person, place, and time. She appears well-developed and well-nourished. No distress.  HENT:  Head: Normocephalic and atraumatic.  Pulmonary/Chest: Effort normal and breath sounds normal. No respiratory distress. She has no wheezes. She has no rales. She exhibits tenderness (TTP over L lower ribs just anterior to mid-axillary line w/o obvious bony abnormality).  Neurological: She is alert and oriented to person, place, and time.  Skin: Skin is warm and dry. No rash noted. No erythema.  Psychiatric: She has a normal mood and affect. Her behavior is normal. Thought content normal.  Vitals reviewed.         Assessment & Plan:  Rib pain- new.  Pt reports she felt a pop for the 2nd time when she did a bending and twisting motion.  Explained that the ribs are connected by cartilage and these can pop- causing intense pain.  Pt reports she feels markedly better today as compared to yesterday.  No bony abnormality.  Start flexeril for muscle spasm.  Ibuprofen/tylenol prn.  Reviewed supportive care and red flags that should prompt return.  Pt expressed understanding and is  in agreement w/ plan.

## 2017-02-28 NOTE — Patient Instructions (Signed)
Follow up as needed or as scheduled We'll notify you of your lab results and make any changes if needed Use the Cyclobenzaprine as needed for spasm- may cause drowsiness Continue to use ibuprofen or tylenol for pain A heating pad may help but be careful not to burn your breast Call with any questions or concerns Hang in there!!!

## 2017-04-22 ENCOUNTER — Encounter: Payer: Self-pay | Admitting: Family Medicine

## 2017-05-04 ENCOUNTER — Other Ambulatory Visit: Payer: Self-pay | Admitting: Family Medicine

## 2017-05-19 LAB — HM DEXA SCAN

## 2017-05-24 LAB — HM DEXA SCAN

## 2017-06-11 ENCOUNTER — Other Ambulatory Visit: Payer: Self-pay | Admitting: Family Medicine

## 2017-06-12 ENCOUNTER — Other Ambulatory Visit: Payer: Self-pay

## 2017-06-12 ENCOUNTER — Encounter: Payer: Self-pay | Admitting: Family Medicine

## 2017-06-12 ENCOUNTER — Ambulatory Visit: Payer: BC Managed Care – PPO | Admitting: Family Medicine

## 2017-06-12 VITALS — BP 122/86 | HR 78 | Temp 98.1°F | Resp 16 | Ht 64.0 in | Wt 168.2 lb

## 2017-06-12 DIAGNOSIS — E038 Other specified hypothyroidism: Secondary | ICD-10-CM

## 2017-06-12 DIAGNOSIS — M79661 Pain in right lower leg: Secondary | ICD-10-CM

## 2017-06-12 DIAGNOSIS — E785 Hyperlipidemia, unspecified: Secondary | ICD-10-CM

## 2017-06-12 DIAGNOSIS — I10 Essential (primary) hypertension: Secondary | ICD-10-CM | POA: Diagnosis not present

## 2017-06-12 LAB — BASIC METABOLIC PANEL
BUN: 30 mg/dL — ABNORMAL HIGH (ref 6–23)
CO2: 22 mEq/L (ref 19–32)
Calcium: 9.9 mg/dL (ref 8.4–10.5)
Chloride: 104 mEq/L (ref 96–112)
Creatinine, Ser: 1.21 mg/dL — ABNORMAL HIGH (ref 0.40–1.20)
GFR: 47.67 mL/min — AB (ref >60.00)
GLUCOSE: 96 mg/dL (ref 70–99)
POTASSIUM: 4.2 meq/L (ref 3.5–5.1)
Sodium: 138 mEq/L (ref 135–145)

## 2017-06-12 LAB — CBC WITH DIFFERENTIAL/PLATELET
BASOS ABS: 0.1 10*3/uL (ref 0.0–0.1)
Basophils Relative: 1.2 % (ref 0.0–3.0)
Eosinophils Absolute: 0.2 10*3/uL (ref 0.0–0.7)
Eosinophils Relative: 3.4 % (ref 0.0–5.0)
HCT: 40.9 % (ref 36.0–46.0)
Hemoglobin: 13.8 g/dL (ref 12.0–15.0)
LYMPHS ABS: 2 10*3/uL (ref 0.7–4.0)
Lymphocytes Relative: 30.1 % (ref 12.0–46.0)
MCHC: 33.8 g/dL (ref 30.0–36.0)
MCV: 100.7 fl — AB (ref 78.0–100.0)
MONO ABS: 0.8 10*3/uL (ref 0.1–1.0)
MONOS PCT: 11.9 % (ref 3.0–12.0)
NEUTROS PCT: 53.4 % (ref 43.0–77.0)
Neutro Abs: 3.5 10*3/uL (ref 1.4–7.7)
Platelets: 264 10*3/uL (ref 150.0–400.0)
RBC: 4.06 Mil/uL (ref 3.87–5.11)
RDW: 13.1 % (ref 11.5–15.5)
WBC: 6.6 10*3/uL (ref 4.0–10.5)

## 2017-06-12 LAB — HEPATIC FUNCTION PANEL
ALBUMIN: 4.2 g/dL (ref 3.5–5.2)
ALK PHOS: 85 U/L (ref 39–117)
ALT: 14 U/L (ref 0–35)
AST: 22 U/L (ref 0–37)
Bilirubin, Direct: 0.1 mg/dL (ref 0.0–0.3)
TOTAL PROTEIN: 6.6 g/dL (ref 6.0–8.3)
Total Bilirubin: 0.5 mg/dL (ref 0.2–1.2)

## 2017-06-12 LAB — LIPID PANEL
CHOLESTEROL: 195 mg/dL (ref 0–200)
HDL: 62.1 mg/dL (ref >39.00)
LDL CALC: 114 mg/dL — AB (ref 0–99)
NonHDL: 132.72
Total CHOL/HDL Ratio: 3
Triglycerides: 96 mg/dL (ref 0.0–149.0)
VLDL: 19.2 mg/dL (ref 0.0–40.0)

## 2017-06-12 LAB — TSH: TSH: 3.11 u[IU]/mL (ref 0.35–4.50)

## 2017-06-12 NOTE — Progress Notes (Signed)
   Subjective:    Patient ID: Marcia Adams, female    DOB: June 02, 1955, 62 y.o.   MRN: [id number]  HPI HTN- chronic problem, on Spironolactone w/ adequate control.  No CP, SOB, HAs, visual changes, edema.  Hyperlipidemia- chronic problem, on Simvastatin.  Pt is down 5 lbs since last visit.  No abd pain, N/V.  Hypothyroid- chronic problem, on Levothyroxine 65mcg daily.  Denies excessive fatigue, changes to skin/hair/nails.  R calf pain- pt developed severe calf pain last week after walking around in [place] all day w/ very limited water intake.  Cramping has stopped but 'it is SO painful'.  No swelling, redness, warmth.  'the more I walk on it, the worse it feels'.      Review of Systems For ROS see HPI     Objective:   Physical Exam  Constitutional: She is oriented to person, place, and time. She appears well-developed and well-nourished. No distress.  HENT:  Head: Normocephalic and atraumatic.  Eyes: Pupils are equal, round, and reactive to light. Conjunctivae and EOM are normal.  Neck: Normal range of motion. Neck supple. No thyromegaly present.  Cardiovascular: Normal rate, regular rhythm, normal heart sounds and intact distal pulses.  No murmur heard. Pulmonary/Chest: Effort normal and breath sounds normal. No respiratory distress.  Abdominal: Soft. She exhibits no distension. There is no tenderness.  Musculoskeletal: She exhibits tenderness (TTP over R calf). She exhibits no edema.  Lymphadenopathy:    She has no cervical adenopathy.  Neurological: She is alert and oriented to person, place, and time.  Skin: Skin is warm and dry.  Psychiatric: She has a normal mood and affect. Her behavior is normal.  Vitals reviewed.         Assessment & Plan:  R calf pain- new.  Likelihood of DVT is low but given that occurred after a trip to [place] will get doppler to assess.  Suspect this is a calf strain caused by severe muscle cramp (Charly horse).  HEAT.  Ibuprofen prn.   Reviewed supportive care and red flags that should prompt return.  Pt expressed understanding and is in agreement w/ plan.

## 2017-06-12 NOTE — Assessment & Plan Note (Signed)
Chronic problem.  Tolerating statin w/o difficulty.  Stressed need for healthy diet and regular exercise.  Check labs.  Adjust meds prn  

## 2017-06-12 NOTE — Assessment & Plan Note (Signed)
Chronic problem.  Adequate control.  Asymptomatic.  Check labs.  No anticipated med changes.  Will follow. 

## 2017-06-12 NOTE — Patient Instructions (Signed)
Schedule your complete physical in 6 months We'll notify you of your lab results and make any changes if needed Keep up the good work on healthy diet and regular exercise- you look great! We'll notify you of your ultrasound results and make any changes if needed HEAT!! Call with any questions or concerns Happy Mother's Day!!!

## 2017-06-12 NOTE — Assessment & Plan Note (Signed)
Chronic problem.  Currently asymptomatic.  Check labs.  Adjust meds prn  

## 2017-06-13 ENCOUNTER — Ambulatory Visit (HOSPITAL_BASED_OUTPATIENT_CLINIC_OR_DEPARTMENT_OTHER)
Admission: RE | Admit: 2017-06-13 | Discharge: 2017-06-13 | Disposition: A | Discharge status: Home / Self Care | Payer: BC Managed Care – PPO | Source: Ambulatory Visit | Attending: Family Medicine | Admitting: Family Medicine

## 2017-06-13 DIAGNOSIS — M79661 Pain in right lower leg: Secondary | ICD-10-CM | POA: Insufficient documentation

## 2017-07-15 ENCOUNTER — Encounter: Payer: Self-pay | Admitting: Family Medicine

## 2017-07-29 LAB — HM MAMMOGRAPHY

## 2017-08-02 ENCOUNTER — Encounter: Payer: Self-pay | Admitting: General Practice

## 2017-08-04 ENCOUNTER — Other Ambulatory Visit: Payer: Self-pay | Admitting: Family Medicine

## 2017-08-09 ENCOUNTER — Encounter: Payer: Self-pay | Admitting: Family Medicine

## 2017-09-11 ENCOUNTER — Ambulatory Visit: Payer: BC Managed Care – PPO | Admitting: Family Medicine

## 2017-09-11 ENCOUNTER — Other Ambulatory Visit: Payer: Self-pay

## 2017-09-11 ENCOUNTER — Encounter: Payer: Self-pay | Admitting: Family Medicine

## 2017-09-11 VITALS — BP 121/87 | HR 89 | Temp 98.1°F | Resp 16 | Ht 64.0 in | Wt 177.0 lb

## 2017-09-11 DIAGNOSIS — S92525A Nondisplaced fracture of medial phalanx of left lesser toe(s), initial encounter for closed fracture: Secondary | ICD-10-CM | POA: Diagnosis not present

## 2017-09-11 DIAGNOSIS — M79672 Pain in left foot: Secondary | ICD-10-CM | POA: Diagnosis not present

## 2017-09-11 MED ORDER — DICLOFENAC SODIUM 1 % TD GEL: 2.0000 g | Freq: Four times a day (QID) | TRANSDERMAL | 1 refills | Status: DC

## 2017-09-11 NOTE — Progress Notes (Signed)
   Subjective:    Patient ID: Marcia Adams, female    DOB: [birth year], 62 y.o.   MRN: [id number]  HPI Foot pain- pt was going down 'irregular concrete steps' in flip flops and she fell down landing on both knees and tops of both feet.  Fall occurred 3 weeks ago.  Went to UC at [facility] b/c she was not able to bear weight on foot.  Fractured 3rd toe on L foot.  Was given a boot to wear but not given any follow up instructions.  Pt has very high arches and is now having foot/arch pain from wearing the boot.  Taking ibuprofen but this has worsened reflux.  Pt has hx of plantar fasciitis but says this feels different.   Review of Systems For ROS see HPI     Objective:   Physical Exam  Constitutional: She is oriented to person, place, and time. She appears well-developed and well-nourished. No distress.  Cardiovascular: Intact distal pulses.  Musculoskeletal: She exhibits edema (swelling of L 3rd toe w/ TTP), tenderness (TTP over L 3rd toe and longitudinal arch) and deformity (L bunion- no pain, L little toe curves inward).  Neurological: She is alert and oriented to person, place, and time.  Skin: Skin is warm and dry. No erythema.  Psychiatric: She has a normal mood and affect. Her behavior is normal. Thought content normal.  Vitals reviewed.         Assessment & Plan:  Foot pain- new.  Pt w/ pain over longitudinal arch.  Suspect that this is b/c she has been wearing her boot that has zero arch support and she has very high arches.  Start Voltaren gel.  Switch from boot to supportive sneaker.  Refer to podiatry.  Pt expressed understanding and is in agreement w/ plan.   Toe fx- new.  Buddy tape, supportive shoe.  Refer to podiatry to ensure healing.  Will follow.

## 2017-09-11 NOTE — Patient Instructions (Signed)
Follow up as needed or as scheduled We'll call you with your podiatry appt STOP wearing the boot and wear a good, supportive, sneaker APPLY the Voltaren gel 3-4x/day as needed for foot pain Call with any questions or concerns Hang in there!

## 2017-09-15 ENCOUNTER — Other Ambulatory Visit: Payer: Self-pay | Admitting: Family Medicine

## 2017-09-20 ENCOUNTER — Ambulatory Visit: Payer: Self-pay | Admitting: Podiatry

## 2017-09-24 ENCOUNTER — Encounter: Payer: Self-pay | Admitting: Podiatry

## 2017-09-24 ENCOUNTER — Ambulatory Visit (INDEPENDENT_AMBULATORY_CARE_PROVIDER_SITE_OTHER): Payer: BC Managed Care – PPO

## 2017-09-24 ENCOUNTER — Ambulatory Visit: Payer: BC Managed Care – PPO | Admitting: Podiatry

## 2017-09-24 VITALS — BP 118/78 | HR 90 | Resp 16

## 2017-09-24 DIAGNOSIS — S92335A Nondisplaced fracture of third metatarsal bone, left foot, initial encounter for closed fracture: Secondary | ICD-10-CM

## 2017-09-24 DIAGNOSIS — S92505A Nondisplaced unspecified fracture of left lesser toe(s), initial encounter for closed fracture: Secondary | ICD-10-CM | POA: Diagnosis not present

## 2017-09-25 NOTE — Progress Notes (Signed)
Subjective:  Patient ID: Marcia Adams, female    DOB: Jul 25, 1955,  MRN: [id number] HPI Chief Complaint  Patient presents with  . Foot Pain    Patient fell 5 weeks ago, fx 3rd toe left, now plantar forefoot, arch and lateral side is hurting, AM pain, wearing cam boot, tried finn comfort insoles, PCP rx'd volarean gel - no help  . New Patient (Initial Visit)    62 y.o. female presents with the above complaint.   Denies fever chills nausea vomiting muscle aches pains calf pain back pain chest pain shortness of breath.  Past Medical History:  Diagnosis Date  . Allergic rhinitis   . Allergy   . Anemia   . Anxiety   . Depression   . Diverticulitis   . Esophageal stricture   . GERD (gastroesophageal reflux disease)   . Heart murmur    mild since birth  . Hiatal hernia   . Hyperlipidemia   . Hypertension   . Hypothyroidism   . Insomnia   . Kidney stones   . Menopause   . Renal insufficiency   . Vitamin D deficiency    Past Surgical History:  Procedure Laterality Date  . HEMORRHOID SURGERY    . MASS EXCISION  06/08/2011   Procedure: MINOR EXCISION OF MASS;  Surgeon: Cammie Sickle., MD;  Location: [facility];  Service: Orthopedics;  Laterality: Right;  excisional biopsy dorsal right hand  . NASAL SINUS SURGERY  7/99  . NISSEN FUNDOPLICATION  9/37  . 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 RETINAL DETACHMENT SURGERY     Gas bubble in center of right eye.  Pt not to lye completely flat.  Marland Kitchen STOMACH SURGERY    . UPPER GASTROINTESTINAL ENDOSCOPY      Current Outpatient Medications:  .  acyclovir (ZOVIRAX) 400 MG tablet, Take 400 mg by mouth 5 (five) times daily., Disp: , Rfl:  .  Azelastine-Fluticasone (DYMISTA) 137-50 MCG/ACT SUSP, Place 2 sprays into the nose 2 (two) times daily., Disp: , Rfl:  .  chlordiazePOXIDE (LIBRIUM) 5 MG capsule, , Disp: , Rfl:  .  clindamycin (CLINDAGEL) 1 % gel, Apply topically daily. , Disp: , Rfl:  .  diclofenac sodium (VOLTAREN)  1 % GEL, Apply 2 g topically 4 (four) times daily., Disp: 100 g, Rfl: 1 .  EPIPEN 2-PAK 0.3 MG/0.3ML DEVI, Inject 0.3 mg into the muscle daily as needed (allergic reaction). , Disp: , Rfl:  .  Estradiol 10 MCG TABS vaginal tablet, , Disp: , Rfl:  .  Eszopiclone 3 MG TABS, Take 3 mg by mouth at bedtime. Take immediately before bedtime, Disp: , Rfl:  .  levothyroxine (SYNTHROID, LEVOTHROID) 88 MCG tablet, TAKE 1 TABLET(88 MCG) BY MOUTH DAILY, Disp: 90 tablet, Rfl: 0 .  loperamide (IMODIUM) 2 MG capsule, Take 1 capsule (2 mg total) by mouth 4 (four) times daily as needed for diarrhea or loose stools., Disp: 12 capsule, Rfl: 0 .  loratadine (CLARITIN) 10 MG tablet, Take 10 mg by mouth at bedtime. , Disp: , Rfl:  .  montelukast (SINGULAIR) 10 MG tablet, TK 1 T PO QD IN THE EVE, Disp: , Rfl: 3 .  Polyethylene Glycol 3350 (MIRALAX PO), Take by mouth. Pt takes by mouth twice weekly., Disp: , Rfl:  .  REXULTI 0.5 MG TABS, , Disp: , Rfl:  .  simvastatin (ZOCOR) 10 MG tablet, TAKE 1 TABLET(10 MG) BY MOUTH AT BEDTIME, Disp: 90 tablet, Rfl: 0 .  spironolactone (  ALDACTONE) 50 MG tablet, Take 50 mg by mouth 2 (two) times daily. , Disp: , Rfl:  .  tazarotene (AVAGE) 0.1 % cream, Apply topically at bedtime., Disp: , Rfl:  .  valACYclovir (VALTREX) 500 MG tablet, , Disp: , Rfl:  .  vortioxetine HBr (BRINTELLIX) 20 MG TABS tablet, Take 20 mg by mouth daily., Disp: , Rfl:   Allergies  Allergen Reactions  . Ciprofloxacin Other (See Comments)    Retinal detachment, severe muscle pain; Pt got tendonitis   . Aripiprazole Other (See Comments)    Excessive weight gain  . Aripiprazole     Excessive weight gain  . Escitalopram Oxalate Other (See Comments)    Per pt "my legs hurt"  . Enalapril Rash   Review of Systems Objective:   Vitals:   09/24/17 1326  BP: 118/78  Pulse: 90  Resp: 16    General: Well developed, nourished, in no acute distress, alert and oriented x3   Dermatological: Skin is warm, dry  and supple bilateral. Nails x 10 are well maintained; remaining integument appears unremarkable at this time. There are no open sores, no preulcerative lesions, no rash or signs of infection present.  Vascular: Dorsalis Pedis artery and Posterior Tibial artery pedal pulses are 2/4 bilateral with immedate capillary fill time. Pedal hair growth present. No varicosities and no lower extremity edema present bilateral.   Neruologic: Grossly intact via light touch bilateral. Vibratory intact via tuning fork bilateral. Protective threshold with Semmes Wienstein monofilament intact to all pedal sites bilateral. Patellar and Achilles deep tendon reflexes 2+ bilateral. No Babinski or clonus noted bilateral.   Musculoskeletal: No gross boney pedal deformities bilateral. No pain, crepitus, or limitation noted with foot and ankle range of motion bilateral. Muscular strength 5/5 in all groups tested bilateral.  Gait: Unassisted, Nonantalgic.    Radiographs:  Radiographs taken today demonstrate telescopic fracture of the middle phalanx third digi left foot.  Also appears that there is a stress fracture nondisplaced non-comminuted metatarsal base 3 left foot..  Assessment & Plan:   Assessment: Fracture third metatarsal base left fracture third toe left  Plan: Continue the use of the cam walker for at least another 4 weeks may be longer would like to follow-up with her at that time for another x-ray.     Max T. [place], [place]

## 2017-10-22 ENCOUNTER — Encounter: Payer: Self-pay | Admitting: Podiatry

## 2017-10-22 ENCOUNTER — Ambulatory Visit (INDEPENDENT_AMBULATORY_CARE_PROVIDER_SITE_OTHER): Payer: BC Managed Care – PPO

## 2017-10-22 ENCOUNTER — Ambulatory Visit: Payer: BC Managed Care – PPO | Admitting: Podiatry

## 2017-10-22 DIAGNOSIS — S92505D Nondisplaced unspecified fracture of left lesser toe(s), subsequent encounter for fracture with routine healing: Secondary | ICD-10-CM

## 2017-10-22 DIAGNOSIS — S92335D Nondisplaced fracture of third metatarsal bone, left foot, subsequent encounter for fracture with routine healing: Secondary | ICD-10-CM | POA: Diagnosis not present

## 2017-10-23 NOTE — Progress Notes (Signed)
She presents today for follow-up of her fractured third metatarsal base of the left foot.  She states that this feels a little bit better though the toe still hurts.  Objective: Vital signs are stable she is alert and oriented x3.  Pulses are palpable.  She has no erythema edema cellulitis drainage or odor no ecchymosis.  She still has some tenderness on palpation of the proximal third metatarsal but not like last time.  Third toe is still moderately swollen.  Radiographs taken today demonstrate what appears to be healing third metatarsal base fracture.  Toe appears to be healing as well.  Assessment: Well-healing third ray.  Plan: Encouraged her to try to wear a pair of stiff soled shoes if she cannot do this easily then continue with the boot for another 2 to 3 weeks I will follow-up with her on an as-needed basis.

## 2017-11-05 ENCOUNTER — Other Ambulatory Visit: Payer: Self-pay | Admitting: Family Medicine

## 2017-11-23 ENCOUNTER — Ambulatory Visit: Payer: BC Managed Care – PPO | Admitting: Podiatry

## 2017-11-23 ENCOUNTER — Encounter: Payer: Self-pay | Admitting: Podiatry

## 2017-11-23 DIAGNOSIS — L601 Onycholysis: Secondary | ICD-10-CM | POA: Diagnosis not present

## 2017-11-23 DIAGNOSIS — S90221S Contusion of right lesser toe(s) with damage to nail, sequela: Secondary | ICD-10-CM | POA: Diagnosis not present

## 2017-11-23 NOTE — Progress Notes (Signed)
Subjective: 62 year old female presents the office today for concerns of her right big toenail becoming dark and lifting.  This started when she had the injury couple months ago.  She states that some of the injury she fractured her left foot that she is been under the care of Dr. Milinda Pointer for she also had multiple abrasions to her legs and she also notes her toenails became dark and discolored particular her right big toenail but however more recently started to lift up.  She states this only attached on the base.  Denies any redness or drainage to the area.  She has no other concerns. Denies any systemic complaints such as fevers, chills, nausea, vomiting. No acute changes since last appointment, and no other complaints at this time.   Objective: AAO x3, NAD DP/PT pulses palpable bilaterally, CRT less than 3 seconds The right hallux toenail is dark and has old dried blood under the nail. It is loose and only attached along the proximal medial and proximal nail fold and it starting to become ingrown along the medial corner. Minimal edema to the medial nail corner. No surrounding erythema, ascending cellulitis. No drainage or pus.  No open lesions or pre-ulcerative lesions.  No pain with calf compression, swelling, warmth, erythema  Assessment: Onycholysis right hallux toenail with old subungual hematoma from injury  Plan: -All treatment options discussed with the patient including all alternatives, risks, complications.  -At this time, discussed total nail removal without chemical matricectomy to the right hallux due to onycholysis Risks and complications were discussed with the patient for which they understand and written consent was obtained. Under sterile conditions a total of 3 mL of a mixture of 2% lidocaine plain and 0.5% Marcaine plain was infiltrated in a hallux block fashion. Once anesthetized, the skin was prepped in sterile fashion. A tourniquet was then applied. Next the right hallux nail  was excised making sure to remove the entire offending nail border. Once the nail was removed, the area was debrided and the underlying skin was intact. The area was irrigated and hemostasis was obtained.  A dry sterile dressing was applied. After application of the dressing the tourniquet was removed and there is found to be an immediate capillary refill time to the digit. The patient tolerated the procedure well any complications. Post procedure instructions were discussed the patient for which he verbally understood. Follow-up in one week for nail check or sooner if any problems are to arise. Discussed signs/symptoms of worsening infection and directed to call the office immediately should any occur or go directly to the emergency room. In the meantime, encouraged to call the office with any questions, concerns, changes symptoms. -Patient encouraged to call the office with any questions, concerns, change in symptoms.   Trula Slade DPM

## 2017-11-23 NOTE — Patient Instructions (Signed)

## 2017-11-28 ENCOUNTER — Ambulatory Visit: Payer: BC Managed Care – PPO

## 2017-11-28 ENCOUNTER — Encounter: Payer: Self-pay | Admitting: Family Medicine

## 2017-11-28 DIAGNOSIS — L601 Onycholysis: Secondary | ICD-10-CM

## 2017-11-28 NOTE — Patient Instructions (Signed)

## 2017-11-29 NOTE — Progress Notes (Signed)
Patient is here today for follow-up appointment, ingrown nail removal right hallux, procedure performed 11/23/2017.  She states that she is not having any pain or tenderness in the area, and she thinks overall is doing well.  No erythema, no redness, no swelling, no drainage, no other signs and symptoms of infection.  Area is healing over well and is beginning to scab.  Discussed signs and symptoms of infection with patient.  Verbal and written instructions were given to the patient.  She is to follow-up with any acute symptom changes.

## 2017-12-19 ENCOUNTER — Encounter: Payer: BC Managed Care – PPO | Admitting: Family Medicine

## 2017-12-27 ENCOUNTER — Ambulatory Visit: Payer: BC Managed Care – PPO | Admitting: Family Medicine

## 2018-02-08 ENCOUNTER — Other Ambulatory Visit: Payer: Self-pay | Admitting: Family Medicine

## 2018-02-11 ENCOUNTER — Ambulatory Visit: Payer: BC Managed Care – PPO | Admitting: Podiatry

## 2018-02-11 ENCOUNTER — Ambulatory Visit (INDEPENDENT_AMBULATORY_CARE_PROVIDER_SITE_OTHER): Payer: BC Managed Care – PPO

## 2018-02-11 DIAGNOSIS — M779 Enthesopathy, unspecified: Secondary | ICD-10-CM | POA: Diagnosis not present

## 2018-02-11 DIAGNOSIS — S92505D Nondisplaced unspecified fracture of left lesser toe(s), subsequent encounter for fracture with routine healing: Secondary | ICD-10-CM | POA: Diagnosis not present

## 2018-02-16 NOTE — Progress Notes (Signed)
Subjective: 63 year old female presents the office today for follow-up evaluation of a fracture of the left foot that she sustained in August 2019.  She was under the care of Dr. Milinda Pointer for this.  She states that saw her she is continue to have pain she feels is not healing she gets pain overall to her foot.  After she does some activity she notices swelling and discomfort.  She is not had any recent injury or trauma since we last saw her.  She describes a sharp, discomfort and standing aggravates it.  She has no other concerns. Denies any systemic complaints such as fevers, chills, nausea, vomiting. No acute changes since last appointment, and no other complaints at this time.   Objective: AAO x3, NAD DP/PT pulses palpable bilaterally, CRT less than 3 seconds Minimal discomfort to palpation on the dorsal aspect of the right foot on third metatarsal base but overall she has diffuse tenderness more to the lateral aspect the foot on the sinus tarsi and to the lateral aspect.  There is no edema, erythema and there is no specific area pinpoint bony tenderness.  Flexor, extensor tendons appear to be intact as well as the Achilles tendon, plantar fascial. No open lesions or pre-ulcerative lesions.  No pain with calf compression, swelling, warmth, erythema  Assessment: Left foot tendinitis, healing fracture left foot  Plan: -All treatment options discussed with the patient including all alternatives, risks, complications.  -Repeat x-rays were obtained and reviewed.  No definitive evidence of acute fracture or stress fracture but able to identify the old areas of fracture. -I think she is getting more overuse type symptoms.  She is try to get back to activity with activity she is getting pain.  After we discussed options she wants to proceed with physical therapy and a prescription for [facility] was written today.  If she continues to get symptoms despite physical therapy I will order an MRI.   She agrees with this plan. -Patient encouraged to call the office with any questions, concerns, change in symptoms.   Trula Slade DPM

## 2018-03-18 ENCOUNTER — Ambulatory Visit: Payer: BC Managed Care – PPO | Admitting: Podiatry

## 2018-04-28 ENCOUNTER — Ambulatory Visit: Payer: BC Managed Care – PPO | Admitting: Family Medicine

## 2018-04-29 ENCOUNTER — Encounter: Payer: Self-pay | Admitting: Family Medicine

## 2018-04-29 ENCOUNTER — Telehealth (INDEPENDENT_AMBULATORY_CARE_PROVIDER_SITE_OTHER): Payer: BC Managed Care – PPO | Admitting: Family Medicine

## 2018-04-29 ENCOUNTER — Other Ambulatory Visit: Payer: Self-pay

## 2018-04-29 DIAGNOSIS — E785 Hyperlipidemia, unspecified: Secondary | ICD-10-CM | POA: Diagnosis not present

## 2018-04-29 DIAGNOSIS — I10 Essential (primary) hypertension: Secondary | ICD-10-CM | POA: Diagnosis not present

## 2018-04-29 DIAGNOSIS — E039 Hypothyroidism, unspecified: Secondary | ICD-10-CM

## 2018-04-29 NOTE — Progress Notes (Signed)
I have discussed the procedure for the virtual visit with the patient who has given consent to proceed with assessment and treatment.   Marcia Adams, CMA     

## 2018-04-29 NOTE — Addendum Note (Signed)
Addended by: Midge Minium on: 04/29/2018 11:32 AM   Modules accepted: Orders

## 2018-04-29 NOTE — Progress Notes (Signed)
Virtual Visit via Telephone Note  I connected with Marcia Adams on 04/29/18 at 10:00 AM EDT by telephone and verified that I am speaking with the correct person using two identifiers.   I discussed the limitations, risks, security and privacy concerns of performing an evaluation and management service by telephone and the availability of in person appointments. I also discussed with the patient that there may be a patient responsible charge related to this service. The patient expressed understanding and agreed to proceed.   History of Present Illness: HTN- chronic problem, on Spironolactone 50mg  BID.  Pt doesn't have ability to check BP at home.  No CP, SOB, HAs, visual changes.  'i've been feeling great!'  Hypothyroid- chronic problem, on Levothyroxine 64mcg daily.  Pt denies excessive fatigue, changes to skin/hair/nails.  Hyperlipidemia- chronic problem, she was previously on cholesterol medication but stopped.  Pt fears this is again high.  Pt has not been exercising b/c she broke her 3rd metatarsal and was in a boot for 12 weeks.  Is now able to do some walking outside.  Pt had gained 28 lbs when she started Rixulti but she has since stopped this medication and is again eating normally.    Observations/Objective: Pt is able to speak clearly, coherently without shortness of breath or increased work of breathing.  Thought process is linear.  Mood is appropriate.  Assessment and Plan: HTN- chronic problem, hx of good control.  Pt is not able to check home BP today but she is asymptomatic at this time.  Will schedule future labs at pt's convenience  Hypothyroid- currently asymptomatic.  Will schedule future labs at pt's convenience and adjust meds prn  Hyperlipidemia- chronic problem.  Had stopped statin but is fearful that #s are back up since she has not been able to exercise and gained considerable weight after starting Rixulti.  Check labs and restart meds prn.  Follow Up  Instructions: Schedule a lab appt in the next 1-2 weeks Schedule CPE in 6 months   I discussed the assessment and treatment plan with the patient. The patient was provided an opportunity to ask questions and all were answered. The patient agreed with the plan and demonstrated an understanding of the instructions.   The patient was advised to call back or seek an in-person evaluation if the symptoms worsen or if the condition fails to improve as anticipated.  I provided 13 minutes of non-face-to-face time during this encounter.   Annye Asa, MD

## 2018-05-07 ENCOUNTER — Encounter: Payer: Self-pay | Admitting: Family Medicine

## 2018-05-07 ENCOUNTER — Other Ambulatory Visit (INDEPENDENT_AMBULATORY_CARE_PROVIDER_SITE_OTHER): Payer: BC Managed Care – PPO

## 2018-05-07 DIAGNOSIS — E785 Hyperlipidemia, unspecified: Secondary | ICD-10-CM | POA: Diagnosis not present

## 2018-05-07 DIAGNOSIS — E039 Hypothyroidism, unspecified: Secondary | ICD-10-CM

## 2018-05-07 LAB — LIPID PANEL
Cholesterol: 239 mg/dL — ABNORMAL HIGH (ref 0–200)
HDL: 60.1 mg/dL (ref >39.00)
LDL Cholesterol: 157 mg/dL — ABNORMAL HIGH (ref 0–99)
NonHDL: 178.82
Total CHOL/HDL Ratio: 4
Triglycerides: 110 mg/dL (ref 0.0–149.0)
VLDL: 22 mg/dL (ref 0.0–40.0)

## 2018-05-07 LAB — CBC WITH DIFFERENTIAL/PLATELET
Basophils Absolute: 0.1 10*3/uL (ref 0.0–0.1)
Basophils Relative: 1.2 % (ref 0.0–3.0)
Eosinophils Absolute: 0.2 10*3/uL (ref 0.0–0.7)
Eosinophils Relative: 2.4 % (ref 0.0–5.0)
HCT: 43.5 % (ref 36.0–46.0)
Hemoglobin: 15.1 g/dL — ABNORMAL HIGH (ref 12.0–15.0)
Lymphocytes Relative: 36.5 % (ref 12.0–46.0)
Lymphs Abs: 2.4 10*3/uL (ref 0.7–4.0)
MCHC: 34.7 g/dL (ref 30.0–36.0)
MCV: 100 fl (ref 78.0–100.0)
Monocytes Absolute: 0.7 10*3/uL (ref 0.1–1.0)
Monocytes Relative: 10 % (ref 3.0–12.0)
Neutro Abs: 3.4 10*3/uL (ref 1.4–7.7)
Neutrophils Relative %: 49.9 % (ref 43.0–77.0)
Platelets: 295 10*3/uL (ref 150.0–400.0)
RBC: 4.35 Mil/uL (ref 3.87–5.11)
RDW: 13.8 % (ref 11.5–15.5)
WBC: 6.7 10*3/uL (ref 4.0–10.5)

## 2018-05-07 LAB — HEPATIC FUNCTION PANEL
ALT: 16 U/L (ref 0–35)
AST: 21 U/L (ref 0–37)
Albumin: 4.3 g/dL (ref 3.5–5.2)
Alkaline Phosphatase: 96 U/L (ref 39–117)
Bilirubin, Direct: 0.1 mg/dL (ref 0.0–0.3)
Total Bilirubin: 0.6 mg/dL (ref 0.2–1.2)
Total Protein: 6.7 g/dL (ref 6.0–8.3)

## 2018-05-07 LAB — BASIC METABOLIC PANEL
BUN: 26 mg/dL — ABNORMAL HIGH (ref 6–23)
CO2: 19 mEq/L (ref 19–32)
Calcium: 10.1 mg/dL (ref 8.4–10.5)
Chloride: 104 mEq/L (ref 96–112)
Creatinine, Ser: 1.21 mg/dL — ABNORMAL HIGH (ref 0.40–1.20)
GFR: 44.72 mL/min — ABNORMAL LOW (ref >60.00)
Glucose, Bld: 96 mg/dL (ref 70–99)
Potassium: 4.6 mEq/L (ref 3.5–5.1)
Sodium: 137 mEq/L (ref 135–145)

## 2018-05-07 LAB — TSH: TSH: 0.96 u[IU]/mL (ref 0.35–4.50)

## 2018-05-08 ENCOUNTER — Other Ambulatory Visit: Payer: Self-pay | Admitting: *Deleted

## 2018-05-08 MED ORDER — SIMVASTATIN 10 MG PO TABS: 10.0000 mg | ORAL_TABLET | Freq: Every day | ORAL | 3 refills | Status: DC

## 2018-05-10 ENCOUNTER — Other Ambulatory Visit: Payer: Self-pay | Admitting: Family Medicine

## 2018-06-04 ENCOUNTER — Ambulatory Visit: Payer: BC Managed Care – PPO | Admitting: Family Medicine

## 2018-08-09 ENCOUNTER — Other Ambulatory Visit: Payer: Self-pay | Admitting: Family Medicine

## 2018-08-28 LAB — HM PAP SMEAR

## 2018-08-28 LAB — HM MAMMOGRAPHY: HM Mammogram: NORMAL (ref 0–4)

## 2018-09-29 ENCOUNTER — Encounter: Payer: Self-pay | Admitting: General Practice

## 2018-09-29 ENCOUNTER — Encounter: Payer: Self-pay | Admitting: Family Medicine

## 2018-10-11 IMAGING — CT CT ABD-PELV W/ CM
2 of 5 series · 15 of 46 positions shown, 17 images · IV contrast (APPLIED)
Comparison: CT 11/22/2013

CLINICAL DATA: 61-year-old female with a history of epigastric pain

EXAM:
CT ABDOMEN AND PELVIS WITH CONTRAST
TECHNIQUE: Multidetector CT imaging of the abdomen and pelvis was performed
using the standard protocol following bolus administration of
intravenous contrast.
CONTRAST:  80mL [id number] IOPAMIDOL ([id number]) INJECTION 61%

[Series 2: axial st · axial · 0.87mm/px · z∈[-409,+6]mm · 12 of 95 slices shown, 14 images]
[im 6/95  soft-tissue]
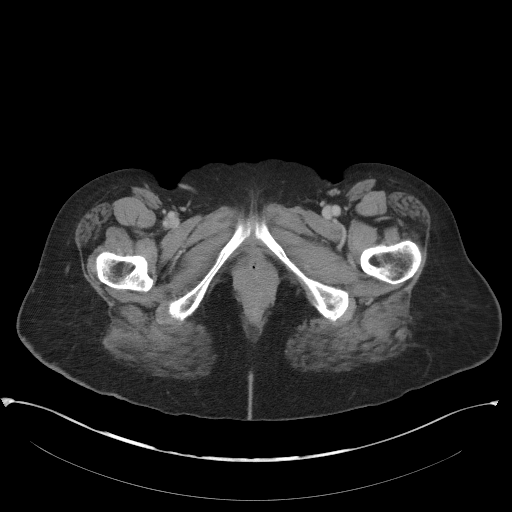
[im 6/95  bone]
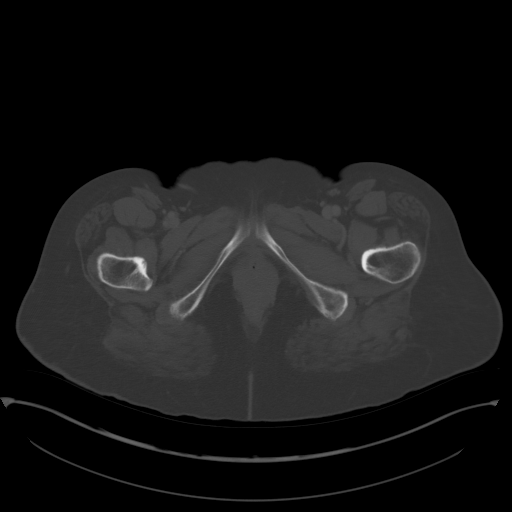
[im 16/95  soft-tissue]
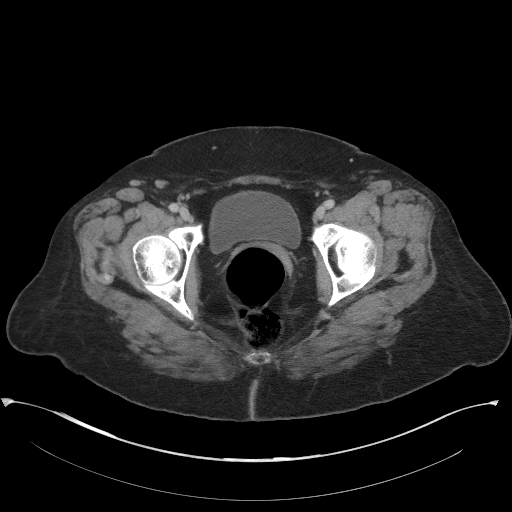
[im 21/95  soft-tissue]
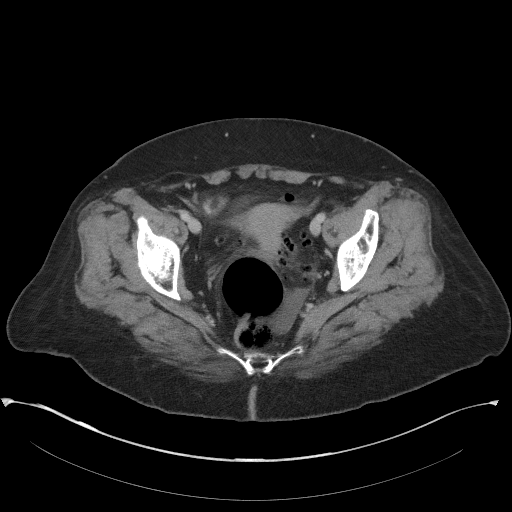
[im 27/95  soft-tissue]
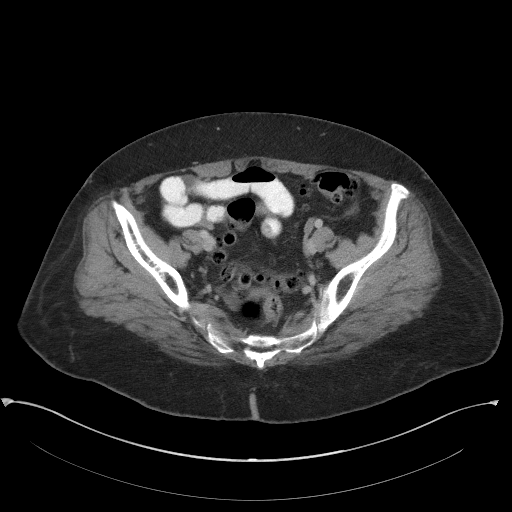
[im 37/95  soft-tissue]
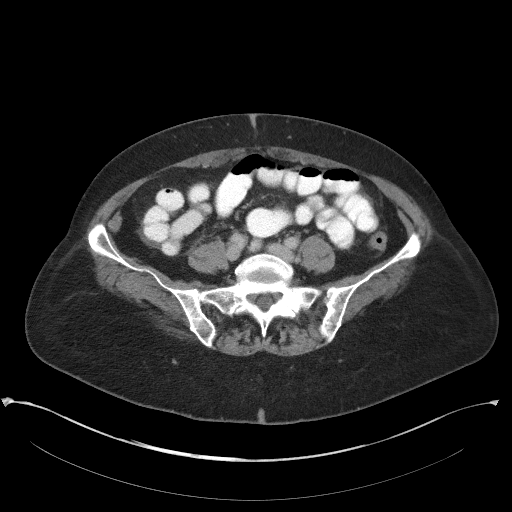
[im 42/95  soft-tissue]
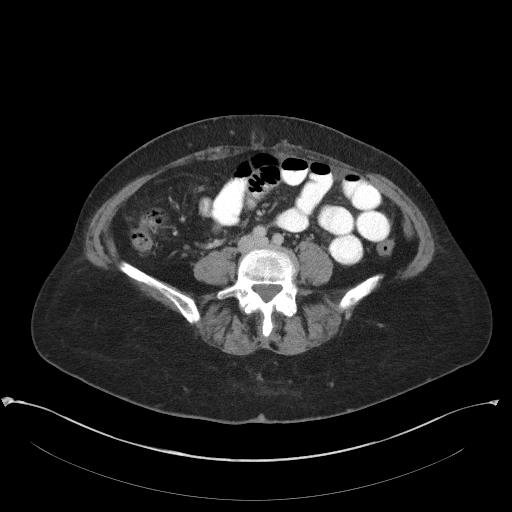
[im 53/95  soft-tissue]
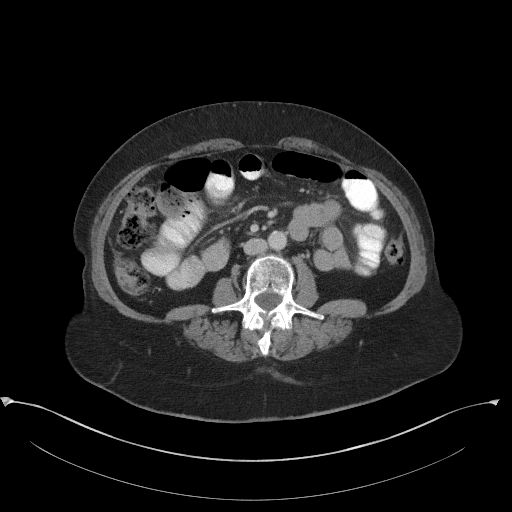
[im 58/95  soft-tissue]
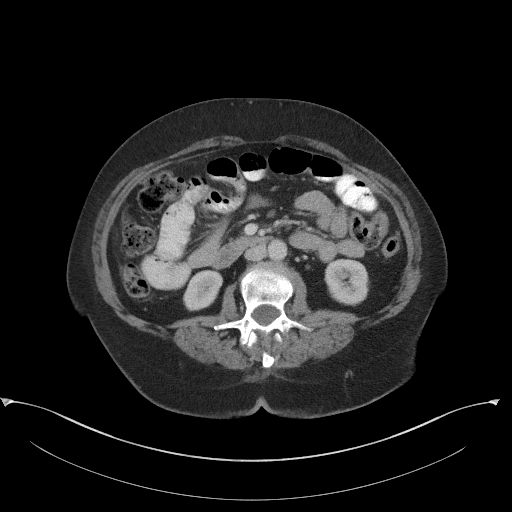
[im 68/95  soft-tissue]
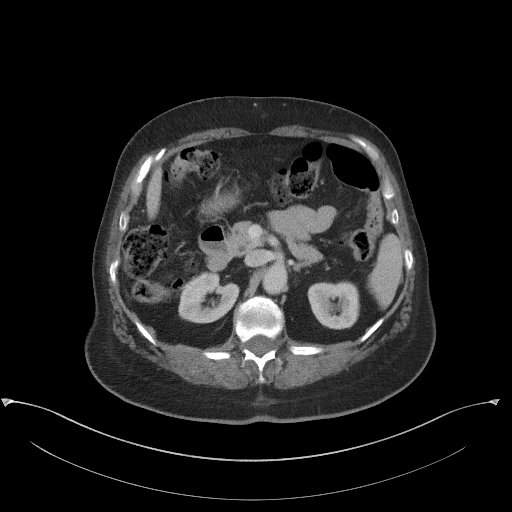
[im 68/95  bone]
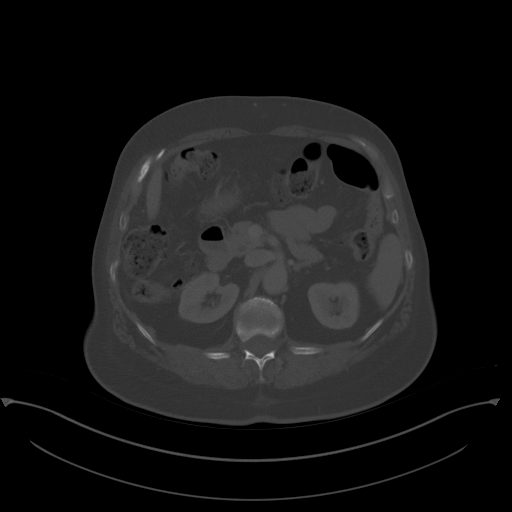
[im 74/95  soft-tissue]
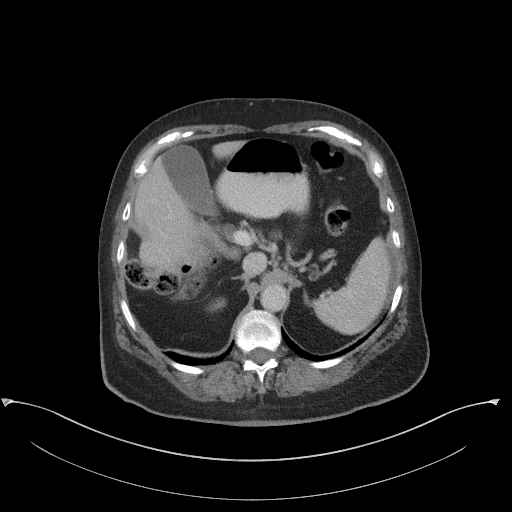
[im 79/95  soft-tissue]
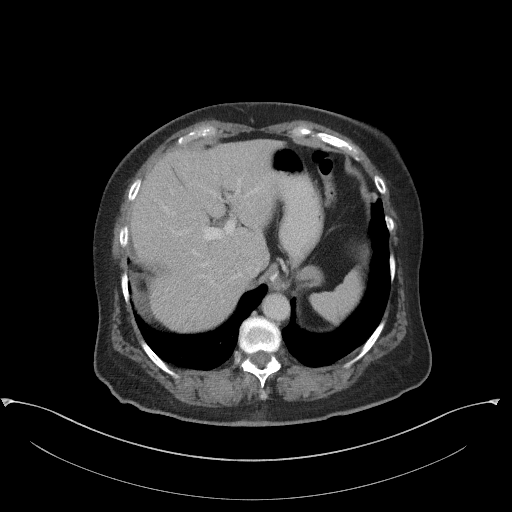
[im 89/95  soft-tissue]
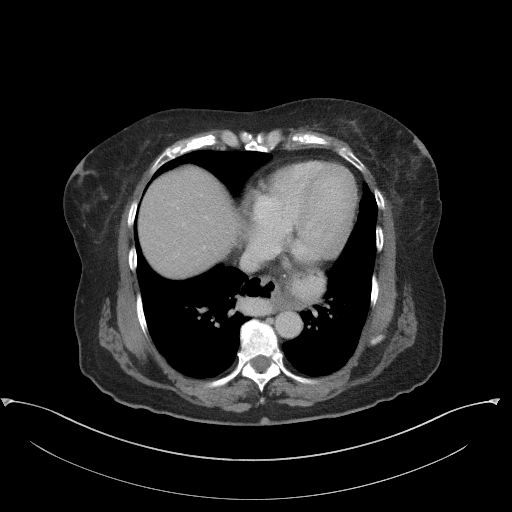

[Series 5: coronal st · coronal · 0.77mm/px · 3 of 101 slices shown]
[im 34/101  soft-tissue]
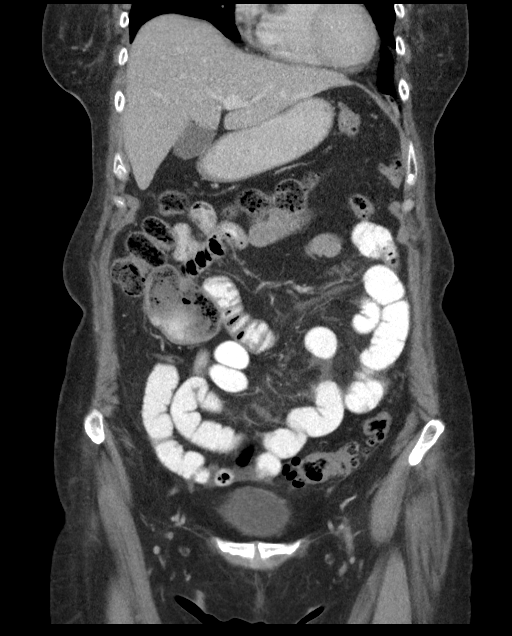
[im 45/101  soft-tissue]
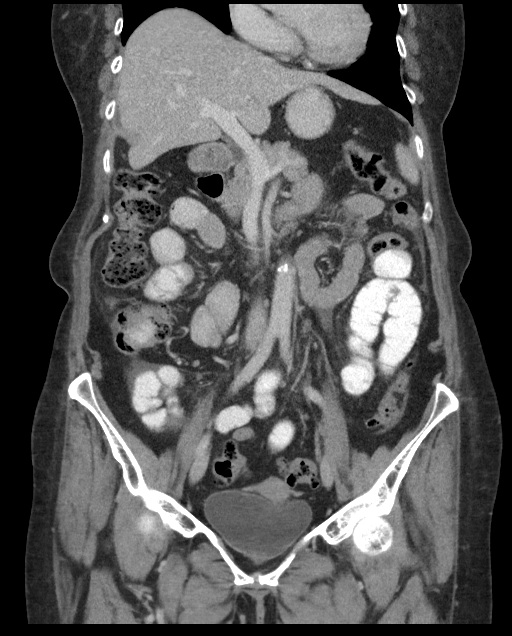
[im 56/101  soft-tissue]
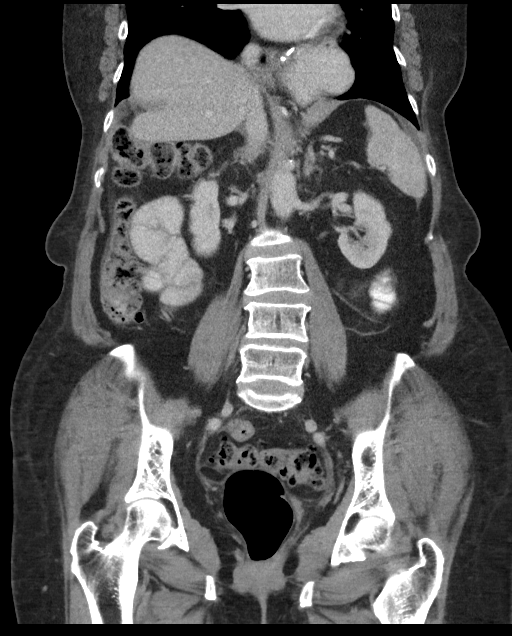

[15 of 46 positions shown; findings below may reference images not displayed]

FINDINGS: Lower chest: Similar configuration of hiatal hernia with surgical
changes at the GE junction in this patient with a history of Nissen
fundoplication. Air-fluid level within the distal esophagus.

Hepatobiliary: Unremarkable appearance of liver parenchyma.
Unremarkable gallbladder.

Pancreas: Unremarkable pancreas

Spleen: Unremarkable spleen

Adrenals/Urinary Tract: Unremarkable adrenal glands.

Unremarkable appearance of the left kidney with no hydronephrosis or
nephrolithiasis. Unremarkable course of the left ureter.

The right kidney again demonstrates low-density cyst on the lateral
cortex measuring 3.2 cm compatible with Bosniak 1 cyst. No
hydronephrosis or nephrolithiasis. Unremarkable course of the right
ureter.

Unremarkable urinary bladder.

Stomach/Bowel: Hiatal hernia with surgical changes at the GE
junction again demonstrated. No evidence of gastric outlet
obstruction. There is retained air-fluid level in the distal
esophagus.

No abnormally dilated small bowel. No transition point. No focal
wall thickening. Enteric contrast reaches the colon. Normal
appendix.

No significant stool burden. No distended colon or focal wall
thickening. Extensive diverticular disease, predominantly within the
descending and sigmoid colon. No associated inflammatory changes.

Vascular/Lymphatic: No aneurysm or dissection. Mild atherosclerotic
changes of the aorta. Bilateral iliac and femoral vasculature
patent.

Unremarkable appearance of the venous system.

Reproductive: Unremarkable pelvic structures.

Other: Small volume of low-density free fluid within the anatomic
pelvis, un certain etiology.

Musculoskeletal: No acute displaced fracture. No bony canal
narrowing. Multilevel degenerative changes of the spine. Mild
degenerative changes of the hips.
IMPRESSION: Small volume low-density free fluid within the anatomic pelvis,
nonspecific etiology. Unlikely to represent physiologic fluid given
the patient's age, and this may reflect nonspecific
enteritis/nonspecific colitis. Pertinent negatives include no CT
evidence of acute diverticulitis or bowel obstruction.

Chronic hiatal hernia, appears to represent a "slipped Rainer".
Associated air-fluid level in the distal esophagus.

Diverticular disease without evidence of acute diverticulitis.

These results were called by telephone at the time of interpretation
on 12/06/2016 at [DATE] to Dr. Clarke, who verbally acknowledged these
results.

## 2018-11-07 ENCOUNTER — Other Ambulatory Visit: Payer: Self-pay | Admitting: General Practice

## 2018-11-07 MED ORDER — LEVOTHYROXINE SODIUM 88 MCG PO TABS: ORAL_TABLET | ORAL | 0 refills | Status: DC

## 2018-11-11 ENCOUNTER — Encounter: Payer: Self-pay | Admitting: General Practice

## 2018-11-11 ENCOUNTER — Encounter: Payer: Self-pay | Admitting: Family Medicine

## 2018-11-15 ENCOUNTER — Encounter: Payer: Self-pay | Admitting: Family Medicine

## 2019-01-05 HISTORY — PX: COLONOSCOPY: SHX174

## 2019-01-05 LAB — HM COLONOSCOPY

## 2019-01-22 ENCOUNTER — Encounter: Payer: Self-pay | Admitting: General Practice

## 2019-02-09 ENCOUNTER — Other Ambulatory Visit: Payer: Self-pay | Admitting: *Deleted

## 2019-02-09 MED ORDER — LEVOTHYROXINE SODIUM 88 MCG PO TABS: ORAL_TABLET | ORAL | 0 refills | Status: DC

## 2019-03-15 ENCOUNTER — Ambulatory Visit: Payer: BC Managed Care – PPO

## 2019-03-31 ENCOUNTER — Ambulatory Visit: Payer: BC Managed Care – PPO

## 2019-04-10 ENCOUNTER — Ambulatory Visit (INDEPENDENT_AMBULATORY_CARE_PROVIDER_SITE_OTHER): Payer: Medicare PPO | Admitting: Family Medicine

## 2019-04-10 ENCOUNTER — Encounter: Payer: Self-pay | Admitting: Family Medicine

## 2019-04-10 ENCOUNTER — Other Ambulatory Visit: Payer: Self-pay

## 2019-04-10 DIAGNOSIS — E785 Hyperlipidemia, unspecified: Secondary | ICD-10-CM | POA: Diagnosis not present

## 2019-04-10 DIAGNOSIS — I10 Essential (primary) hypertension: Secondary | ICD-10-CM | POA: Diagnosis not present

## 2019-04-10 DIAGNOSIS — E038 Other specified hypothyroidism: Secondary | ICD-10-CM

## 2019-04-10 MED ORDER — LEVOTHYROXINE SODIUM 88 MCG PO TABS: ORAL_TABLET | ORAL | 0 refills | Status: DC

## 2019-04-10 NOTE — Progress Notes (Signed)
Virtual Visit via Video   I connected with patient on 04/10/19 at  3:00 PM EST by a video enabled telemedicine application and verified that I am speaking with the correct person using two identifiers.  Location patient: Home Location provider: Acupuncturist, Office Persons participating in the virtual visit: Patient, Provider, [place] (Jess B)  I discussed the limitations of evaluation and management by telemedicine and the availability of in person appointments. The patient expressed understanding and agreed to proceed.  Subjective:   HPI:   HTN- chronic problem, currently on Spironolactone 50mg  BID.  Unable to check BP at home.  No CP, SOB, HAs, visual changes, edema.  Hyperlipidemia- chronic problem, on Simvastatin 10mg  nightly.  Pt denies abd pain, N/V.  Pt is not doing any regular exercise.  Hypothyroid- chronic problem, on Levothyroxine 15mcg but has been out of medication for a few days due to a lapse in appts.  + low energy.  Denies changes to skin/hair.  Nails are starting to split vertically.  ROS:   See pertinent positives and negatives per HPI.  Patient Active Problem List   Diagnosis Date Noted  . Physical exam 12/10/2016  . Pain of left thumb 01/10/2015  . Bicipital tendinitis of left shoulder 12/15/2013  . Bicipital tendinitis of right shoulder 12/15/2013  . ASCUS with positive high risk HPV  Dr. Lynnette Caffey 10/2013 10/19/2013  . Diarrhea 09/30/2012  . Retinal detachment 09/30/2012  . Diverticulitis 09/25/2012  . Abdominal pain 09/25/2012  . Nausea vomiting and diarrhea 09/25/2012  . Hypercalcemia 09/17/2012  . 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 Postmenopausal atrophic vaginitis 05/26/2011  . Herpes 04/24/2011  . Anemia   . Anxiety   . Acne vulgaris 04/21/2008  . Keloid 10/22/2007  . Hypothyroidism 04/23/2007  . Carpal tunnel syndrome 10/22/2006  . Benign neoplasm of skin 02/27/2006  . Pain, neck 02/27/2006  . Paresthesia 02/27/2006  . Reflux esophagitis  02/20/2005  . Obesity 10/04/2004  . Heat exhaustion 10/04/2004  . Depression 11/10/2003  . Allergic rhinitis, cause unspecified 11/10/2003  . Calculus of ureter 11/10/2003  . Insomnia 11/10/2003  . Disorder of bone and cartilage 03/24/2003  . Plantar fasciitis 03/04/2002  . Hyperlipidemia 01/14/2002  . Fatigue 12/03/2001  . Pain in the chest 12/03/2001  . Pain, abdominal, epigastric 12/03/2001  . UTI (urinary tract infection) 03/25/2000  . Hemorrhoids, external 03/02/1999  . HTN (hypertension) 01/24/1999  . Allergic rhinitis due to dust 01/24/1999    Social History   Tobacco Use  . Smoking status: Never Smoker  . Smokeless tobacco: Never Used  Substance Use Topics  . Alcohol use: Yes    Comment: occasionally    Current Outpatient Medications:  .  buPROPion (WELLBUTRIN XL) 150 MG 24 hr tablet, TK ONE T PO D UTD, Disp: , Rfl:  .  chlordiazePOXIDE (LIBRIUM) 5 MG capsule, , Disp: , Rfl:  .  clindamycin (CLINDAGEL) 1 % gel, Apply topically daily. , Disp: , Rfl:  .  Eszopiclone 3 MG TABS, Take 3 mg by mouth at bedtime. Take immediately before bedtime, Disp: , Rfl:  .  L-Methylfolate 15 MG TABS, , Disp: , Rfl:  .  loratadine (CLARITIN) 10 MG tablet, Take 10 mg by mouth at bedtime. , Disp: , Rfl:  .  montelukast (SINGULAIR) 10 MG tablet, TK 1 T PO QD IN THE EVE, Disp: , Rfl: 3 .  simvastatin (ZOCOR) 10 MG tablet, Take 1 tablet (10 mg total) by mouth at bedtime., Disp: 90 tablet, Rfl: 3 .  spironolactone (ALDACTONE) 50 MG tablet, Take 50 mg by mouth 2 (two) times daily. , Disp: , Rfl:  .  tazarotene (AVAGE) 0.1 % cream, Apply topically at bedtime., Disp: , Rfl:  .  valACYclovir (VALTREX) 500 MG tablet, , Disp: , Rfl:  .  vortioxetine HBr (BRINTELLIX) 20 MG TABS tablet, Take 20 mg by mouth daily., Disp: , Rfl:  .  Azelastine-Fluticasone (DYMISTA) 137-50 MCG/ACT SUSP, Place 2 sprays into the nose 2 (two) times daily., Disp: , Rfl:  .  EPIPEN 2-PAK 0.3 MG/0.3ML DEVI, Inject 0.3 mg into  the muscle daily as needed (allergic reaction). , Disp: , Rfl:  .  levothyroxine (SYNTHROID) 88 MCG tablet, TAKE 1 TABLET(88 MCG) BY MOUTH DAILY (Patient not taking: Reported on 04/10/2019), Disp: 30 tablet, Rfl: 0  Allergies  Allergen Reactions  . Ciprofloxacin Other (See Comments)    Retinal detachment, severe muscle pain; Pt got tendonitis   . Aripiprazole Other (See Comments)    Excessive weight gain  . Aripiprazole     Excessive weight gain  . Escitalopram Oxalate Other (See Comments)    Per pt "my legs hurt"  . Enalapril Rash    Objective:   There were no vitals taken for this visit.  AAOx3, NAD NCAT, EOMI No obvious CN deficits Coloring WNL Pt is able to speak clearly, coherently without shortness of breath or increased work of breathing.  Thought process is linear.  Mood is appropriate.   Assessment and Plan:   HTN- chronic problem.  On Spironolactone BID.  Pt not able to check BP at home, will get reading when she comes for labs.  No anticipated med changes.  Will follow.  Hyperlipidemia- chronic problem.  Tolerating statin w/o difficulty.  Stressed need for healthy diet and regular exercise.  Check labs.  Adjust meds prn   Hypothyroid- chronic problem.  Pt has been out of medication for a few days b/c she has been overdue for an appt.  Refill sent.  Check labs.  Adjust meds prn   Annye Asa, MD 04/10/2019

## 2019-04-10 NOTE — Progress Notes (Signed)
I have discussed the procedure for the virtual visit with the patient who has given consent to proceed with assessment and treatment.   Pt unable to obtain vitals.   Conner Neiss L Lennyx Verdell, CMA     

## 2019-04-13 ENCOUNTER — Other Ambulatory Visit: Payer: Self-pay | Admitting: General Practice

## 2019-04-13 MED ORDER — LEVOTHYROXINE SODIUM 88 MCG PO TABS: ORAL_TABLET | ORAL | 0 refills | Status: DC

## 2019-04-14 ENCOUNTER — Telehealth: Payer: Self-pay | Admitting: Family Medicine

## 2019-04-14 NOTE — Telephone Encounter (Signed)
LM for pt to call back to schedule a Welcome to medicare cpe with Dr. Birdie Riddle in 3-4 mths

## 2019-04-20 ENCOUNTER — Telehealth: Payer: Self-pay | Admitting: Family Medicine

## 2019-04-20 ENCOUNTER — Ambulatory Visit (INDEPENDENT_AMBULATORY_CARE_PROVIDER_SITE_OTHER): Payer: Medicare PPO

## 2019-04-20 ENCOUNTER — Other Ambulatory Visit: Payer: Self-pay

## 2019-04-20 DIAGNOSIS — E785 Hyperlipidemia, unspecified: Secondary | ICD-10-CM | POA: Diagnosis not present

## 2019-04-20 DIAGNOSIS — E038 Other specified hypothyroidism: Secondary | ICD-10-CM

## 2019-04-20 DIAGNOSIS — I10 Essential (primary) hypertension: Secondary | ICD-10-CM | POA: Diagnosis not present

## 2019-04-20 LAB — CBC WITH DIFFERENTIAL/PLATELET
Basophils Absolute: 0.1 10*3/uL (ref 0.0–0.1)
Basophils Relative: 1.3 % (ref 0.0–3.0)
Eosinophils Absolute: 0.3 10*3/uL (ref 0.0–0.7)
Eosinophils Relative: 4.2 % (ref 0.0–5.0)
HCT: 41.6 % (ref 36.0–46.0)
Hemoglobin: 14.1 g/dL (ref 12.0–15.0)
Lymphocytes Relative: 25.5 % (ref 12.0–46.0)
Lymphs Abs: 1.7 10*3/uL (ref 0.7–4.0)
MCHC: 34 g/dL (ref 30.0–36.0)
MCV: 102.4 fl — ABNORMAL HIGH (ref 78.0–100.0)
Monocytes Absolute: 0.7 10*3/uL (ref 0.1–1.0)
Monocytes Relative: 10.6 % (ref 3.0–12.0)
Neutro Abs: 3.9 10*3/uL (ref 1.4–7.7)
Neutrophils Relative %: 58.4 % (ref 43.0–77.0)
Platelets: 281 10*3/uL (ref 150.0–400.0)
RBC: 4.06 Mil/uL (ref 3.87–5.11)
RDW: 12.1 % (ref 11.5–15.5)
WBC: 6.6 10*3/uL (ref 4.0–10.5)

## 2019-04-20 LAB — BASIC METABOLIC PANEL
BUN: 18 mg/dL (ref 6–23)
CO2: 24 mEq/L (ref 19–32)
Calcium: 9.6 mg/dL (ref 8.4–10.5)
Chloride: 102 mEq/L (ref 96–112)
Creatinine, Ser: 1.2 mg/dL (ref 0.40–1.20)
GFR: 45.02 mL/min — ABNORMAL LOW (ref >60.00)
Glucose, Bld: 105 mg/dL — ABNORMAL HIGH (ref 70–99)
Potassium: 4.4 mEq/L (ref 3.5–5.1)
Sodium: 138 mEq/L (ref 135–145)

## 2019-04-20 LAB — LIPID PANEL
Cholesterol: 194 mg/dL (ref 0–200)
HDL: 51.4 mg/dL (ref >39.00)
LDL Cholesterol: 117 mg/dL — ABNORMAL HIGH (ref 0–99)
NonHDL: 142.38
Total CHOL/HDL Ratio: 4
Triglycerides: 127 mg/dL (ref 0.0–149.0)
VLDL: 25.4 mg/dL (ref 0.0–40.0)

## 2019-04-20 LAB — TSH: TSH: 1.94 u[IU]/mL (ref 0.35–4.50)

## 2019-04-20 LAB — HEPATIC FUNCTION PANEL
ALT: 14 U/L (ref 0–35)
AST: 19 U/L (ref 0–37)
Albumin: 3.9 g/dL (ref 3.5–5.2)
Alkaline Phosphatase: 124 U/L — ABNORMAL HIGH (ref 39–117)
Bilirubin, Direct: 0.1 mg/dL (ref 0.0–0.3)
Total Bilirubin: 0.6 mg/dL (ref 0.2–1.2)
Total Protein: 6.5 g/dL (ref 6.0–8.3)

## 2019-04-20 NOTE — Telephone Encounter (Signed)
Pt called asking if our office can fax over Potassium Level results from today's visit to her dermatology office before her appointment next Tuesday. Dr. Fontaine No, [facility] - fax: [phone].

## 2019-04-21 ENCOUNTER — Other Ambulatory Visit (INDEPENDENT_AMBULATORY_CARE_PROVIDER_SITE_OTHER): Payer: Medicare PPO

## 2019-04-21 DIAGNOSIS — R748 Abnormal levels of other serum enzymes: Secondary | ICD-10-CM

## 2019-04-21 LAB — GAMMA GT: GGT: 25 U/L (ref 7–51)

## 2019-04-21 NOTE — Addendum Note (Signed)
Addended by: Trenda Moots on: [id number] 09:39 AM   Modules accepted: Orders

## 2019-04-22 NOTE — Telephone Encounter (Signed)
Labs faxed

## 2019-06-18 ENCOUNTER — Other Ambulatory Visit: Payer: Self-pay | Admitting: General Practice

## 2019-06-18 MED ORDER — SIMVASTATIN 10 MG PO TABS: 10.0000 mg | ORAL_TABLET | Freq: Every day | ORAL | 3 refills | Status: DC

## 2019-07-14 ENCOUNTER — Other Ambulatory Visit: Payer: Self-pay

## 2019-07-14 ENCOUNTER — Encounter: Payer: Self-pay | Admitting: Family Medicine

## 2019-07-14 ENCOUNTER — Ambulatory Visit (INDEPENDENT_AMBULATORY_CARE_PROVIDER_SITE_OTHER): Payer: Medicare PPO | Admitting: Family Medicine

## 2019-07-14 VITALS — BP 118/78 | HR 76 | Temp 98.0°F | Resp 16 | Ht 64.0 in | Wt 185.0 lb

## 2019-07-14 DIAGNOSIS — E785 Hyperlipidemia, unspecified: Secondary | ICD-10-CM

## 2019-07-14 DIAGNOSIS — J3089 Other allergic rhinitis: Secondary | ICD-10-CM | POA: Diagnosis not present

## 2019-07-14 DIAGNOSIS — E669 Obesity, unspecified: Secondary | ICD-10-CM

## 2019-07-14 DIAGNOSIS — R0683 Snoring: Secondary | ICD-10-CM | POA: Diagnosis not present

## 2019-07-14 DIAGNOSIS — Z0000 Encounter for general adult medical examination without abnormal findings: Secondary | ICD-10-CM | POA: Diagnosis not present

## 2019-07-14 DIAGNOSIS — J3081 Allergic rhinitis due to animal (cat) (dog) hair and dander: Secondary | ICD-10-CM | POA: Diagnosis not present

## 2019-07-14 DIAGNOSIS — E038 Other specified hypothyroidism: Secondary | ICD-10-CM

## 2019-07-14 DIAGNOSIS — J301 Allergic rhinitis due to pollen: Secondary | ICD-10-CM | POA: Diagnosis not present

## 2019-07-14 LAB — BASIC METABOLIC PANEL
BUN: 32 mg/dL — ABNORMAL HIGH (ref 6–23)
CO2: 23 mEq/L (ref 19–32)
Calcium: 10.2 mg/dL (ref 8.4–10.5)
Chloride: 102 mEq/L (ref 96–112)
Creatinine, Ser: 1.55 mg/dL — ABNORMAL HIGH (ref 0.40–1.20)
GFR: 33.48 mL/min — ABNORMAL LOW (ref >60.00)
Glucose, Bld: 93 mg/dL (ref 70–99)
Potassium: 4.4 mEq/L (ref 3.5–5.1)
Sodium: 134 mEq/L — ABNORMAL LOW (ref 135–145)

## 2019-07-14 LAB — HEPATIC FUNCTION PANEL
ALT: 21 U/L (ref 0–35)
AST: 29 U/L (ref 0–37)
Albumin: 4.5 g/dL (ref 3.5–5.2)
Alkaline Phosphatase: 114 U/L (ref 39–117)
Bilirubin, Direct: 0.1 mg/dL (ref 0.0–0.3)
Total Bilirubin: 0.5 mg/dL (ref 0.2–1.2)
Total Protein: 6.8 g/dL (ref 6.0–8.3)

## 2019-07-14 LAB — CBC WITH DIFFERENTIAL/PLATELET
Basophils Absolute: 0 10*3/uL (ref 0.0–0.1)
Basophils Relative: 0.3 % (ref 0.0–3.0)
Eosinophils Absolute: 0.1 10*3/uL (ref 0.0–0.7)
Eosinophils Relative: 0.7 % (ref 0.0–5.0)
HCT: 43.8 % (ref 36.0–46.0)
Hemoglobin: 15 g/dL (ref 12.0–15.0)
Lymphocytes Relative: 30.2 % (ref 12.0–46.0)
Lymphs Abs: 2.9 10*3/uL (ref 0.7–4.0)
MCHC: 34.2 g/dL (ref 30.0–36.0)
MCV: 102 fl — ABNORMAL HIGH (ref 78.0–100.0)
Monocytes Absolute: 0.9 10*3/uL (ref 0.1–1.0)
Monocytes Relative: 9 % (ref 3.0–12.0)
Neutro Abs: 5.6 10*3/uL (ref 1.4–7.7)
Neutrophils Relative %: 59.8 % (ref 43.0–77.0)
Platelets: 291 10*3/uL (ref 150.0–400.0)
RBC: 4.29 Mil/uL (ref 3.87–5.11)
RDW: 13.5 % (ref 11.5–15.5)
WBC: 9.5 10*3/uL (ref 4.0–10.5)

## 2019-07-14 LAB — TSH: TSH: 3.13 u[IU]/mL (ref 0.35–4.50)

## 2019-07-14 LAB — LIPID PANEL
Cholesterol: 192 mg/dL (ref 0–200)
HDL: 70.7 mg/dL (ref >39.00)
LDL Cholesterol: 103 mg/dL — ABNORMAL HIGH (ref 0–99)
NonHDL: 121.57
Total CHOL/HDL Ratio: 3
Triglycerides: 95 mg/dL (ref 0.0–149.0)
VLDL: 19 mg/dL (ref 0.0–40.0)

## 2019-07-14 NOTE — Assessment & Plan Note (Signed)
Chronic problem.  Tolerating statin w/o difficulty.  Check labs.  Adjust meds prn  

## 2019-07-14 NOTE — Assessment & Plan Note (Signed)
Chronic problem.  + fatigue.  Check labs.  Adjust meds prn

## 2019-07-14 NOTE — Assessment & Plan Note (Signed)
Deteriorated.  Pt has gained 10 lbs since last visit.  Stressed need for healthy diet and regular exercise.  Check labs to risk stratify.  Will follow. 

## 2019-07-14 NOTE — Patient Instructions (Addendum)
Follow up in 6 months to recheck cholesterol We'll notify you of your lab results and make any changes if needed Continue to work on healthy diet and regular exercise- you can do it! We'll call you with your Pulmonary appt for the possible sleep apnea Please have [facility] send me a copy of pap and mammo from later this summer Please bring a copy of your living will and healthcare POA so we can update your chart Call with any questions or concerns Have a great summer!   Preventive Care 40 Years and Older, Female Preventive care refers to lifestyle choices and visits with your health care provider that can promote health and wellness. This includes:  A yearly physical exam. This is also called an annual well check.  Regular dental and eye exams.  Immunizations.  Screening for certain conditions.  Healthy lifestyle choices, such as diet and exercise. What can I expect for my preventive care visit? Physical exam Your health care provider will check:  Height and weight. These may be used to calculate body mass index (BMI), which is a measurement that tells if you are at a healthy weight.  Heart rate and blood pressure.  Your skin for abnormal spots. Counseling Your health care provider may ask you questions about:  Alcohol, tobacco, and drug use.  Emotional well-being.  Home and relationship well-being.  Sexual activity.  Eating habits.  History of falls.  Memory and ability to understand (cognition).  Work and work Statistician.  Pregnancy and menstrual history. What immunizations do I need?  Influenza (flu) vaccine  This is recommended every year. Tetanus, diphtheria, and pertussis (Tdap) vaccine  You may need a Td booster every 10 years. Varicella (chickenpox) vaccine  You may need this vaccine if you have not already been vaccinated. Zoster (shingles) vaccine  You may need this after age 74. Pneumococcal conjugate (PCV13) vaccine  One dose  is recommended after age 15. Pneumococcal polysaccharide (PPSV23) vaccine  One dose is recommended after age 10. Measles, mumps, and rubella (MMR) vaccine  You may need at least one dose of MMR if you were born in 1957 or later. You may also need a second dose. Meningococcal conjugate (MenACWY) vaccine  You may need this if you have certain conditions. Hepatitis A vaccine  You may need this if you have certain conditions or if you travel or work in places where you may be exposed to hepatitis A. Hepatitis B vaccine  You may need this if you have certain conditions or if you travel or work in places where you may be exposed to hepatitis B. Haemophilus influenzae type b (Hib) vaccine  You may need this if you have certain conditions. You may receive vaccines as individual doses or as more than one vaccine together in one shot (combination vaccines). Talk with your health care provider about the risks and benefits of combination vaccines. What tests do I need? Blood tests  Lipid and cholesterol levels. These may be checked every 5 years, or more frequently depending on your overall health.  Hepatitis C test.  Hepatitis B test. Screening  Lung cancer screening. You may have this screening every year starting at age 51 if you have a 30-pack-year history of smoking and currently smoke or have quit within the past 15 years.  Colorectal cancer screening. All adults should have this screening starting at age 50 and continuing until age 28. Your health care provider may recommend screening at age 78 if you are at  increased risk. You will have tests every 1-10 years, depending on your results and the type of screening test.  Diabetes screening. This is done by checking your blood sugar (glucose) after you have not eaten for a while (fasting). You may have this done every 1-3 years.  Mammogram. This may be done every 1-2 years. Talk with your health care provider about how often you should  have regular mammograms.  BRCA-related cancer screening. This may be done if you have a family history of breast, ovarian, tubal, or peritoneal cancers. Other tests  Sexually transmitted disease (STD) testing.  Bone density scan. This is done to screen for osteoporosis. You may have this done starting at age 15. Follow these instructions at home: Eating and drinking  Eat a diet that includes fresh fruits and vegetables, whole grains, lean protein, and low-fat dairy products. Limit your intake of foods with high amounts of sugar, saturated fats, and salt.  Take vitamin and mineral supplements as recommended by your health care provider.  Do not drink alcohol if your health care provider tells you not to drink.  If you drink alcohol: ? Limit how much you have to 0-1 drink a day. ? Be aware of how much alcohol is in your drink. In the [place] one drink equals one 12 oz bottle of beer (355 mL), one 5 oz glass of wine (148 mL), or one 1 oz glass of hard liquor (44 mL). Lifestyle  Take daily care of your teeth and gums.  Stay active. Exercise for at least 30 minutes on 5 or more days each week.  Do not use any products that contain nicotine or tobacco, such as cigarettes, e-cigarettes, and chewing tobacco. If you need help quitting, ask your health care provider.  If you are sexually active, practice safe sex. Use a condom or other form of protection in order to prevent STIs (sexually transmitted infections).  Talk with your health care provider about taking a low-dose aspirin or statin. What's next?  Go to your health care provider once a year for a well check visit.  Ask your health care provider how often you should have your eyes and teeth checked.  Stay up to date on all vaccines. This information is not intended to replace advice given to you by your health care provider. Make sure you discuss any questions you have with your health care provider. Document Revised: 01/16/2018  Document Reviewed: 01/16/2018 Elsevier Patient Education  2020 [employer].

## 2019-07-14 NOTE — Progress Notes (Signed)
Subjective:    Patient ID: Marcia Adams, female    DOB: Sep 03, 1955, 64 y.o.   MRN: [id number]  HPI Here today for Welcome to Medicare CPE.  Risk Factors: Hyperlipidemia- chronic problem, on Simvastatin 10mg  nightly Hypothyroid- chronic problem, on Levothyroxine 38mcg daily Obesity- pt has gained 10 lbs, BMI is now 31.76.  No regular exercise Physical Activity: no regular exercise Fall Risk: low Depression: denies current sxs, on medications per psych Hearing: normal to conversational tones and whispered voice ADL's: independent Cognitive: normal linear thought process, memory and attention intact Home Safety: safe at home Height, Weight, BMI, Visual Acuity: see vitals, vision corrected to 20/20 w/ glasses Counseling: UTD on mammo, colonoscopy, DEXA, immunizations. Living Will/POA: pt has both, will bring copies Labs Ordered: See A&P Care Plan: See A&P   Reviewed past medical, surgical, family and social histories.   Patient Care Team    Relationship Specialty Notifications Start End  Midge Minium, MD PCP - General Family Medicine  10/01/14   Lafayette Dragon, MD (Inactive) Consulting Physician Gastroenterology  09/29/14   Mosetta Anis, MD Referring Physician Allergy  09/29/14   Linda Hedges, [place] Physician Obstetrics and Gynecology  09/29/14   Sydnee Levans, MD Consulting Physician Dermatology  09/29/14   Chucky May, MD Consulting Physician Psychiatry  09/29/14      Health Maintenance  Topic Date Due  . HIV Screening  04/09/2020 (Originally 10/18/1970)  . MAMMOGRAM  08/28/2019  . INFLUENZA VACCINE  09/06/2019  . PNA vac Low Risk Adult (2 of 2 - PPSV23) 11/12/2019  . TETANUS/TDAP  05/15/2021  . PAP SMEAR-Modifier  08/27/2021  . COLONOSCOPY  01/05/2024  . DEXA SCAN  Completed  . COVID-19 Vaccine  Completed  . Hepatitis C Screening  Completed     This visit occurred during the SARS-CoV-2 public health emergency.  Safety protocols were in place,  including screening questions prior to the visit, additional usage of staff PPE, and extensive cleaning of exam room while observing appropriate contact time as indicated for disinfecting solutions.    Review of Systems Patient reports no vision/ hearing changes, adenopathy,fever,  persistant/recurrent hoarseness , swallowing issues, chest pain, palpitations, edema, persistant/recurrent cough, hemoptysis, dyspnea (rest/exertional/paroxysmal nocturnal), gastrointestinal bleeding (melena, rectal bleeding), abdominal pain, significant heartburn, bowel changes, GU symptoms (dysuria, hematuria, incontinence), Gyn symptoms (abnormal  bleeding, pain),  syncope, focal weakness, memory loss, numbness & tingling, skin/hair/nail changes, abnormal bruising or bleeding, anxiety, or depression.   + weight gain- 10 lbs + fatigue- 'I wake up more tired than when I went to bed'.  Friends have commented on low energy levels.  Pt has difficulty sleeping so she takes medication.  + snoring.    Objective:   Physical Exam General Appearance:    Alert, cooperative, no distress, appears stated age, obese  Head:    Normocephalic, without obvious abnormality, atraumatic  Eyes:    PERRL, conjunctiva/corneas clear, EOM's intact, fundi    benign, both eyes  Ears:    Normal TM's and external ear canals, both ears  Nose:   Deferred due to COVID  Throat:   Neck:   Supple, symmetrical, trachea midline, no adenopathy;    Thyroid: no enlargement/tenderness/nodules  Back:     Symmetric, no curvature, ROM normal, no CVA tenderness  Lungs:     Clear to auscultation bilaterally, respirations unlabored  Chest Wall:    No tenderness or deformity   Heart:    Regular rate and rhythm, S1 and S2 normal,  no murmur, rub   or gallop  Breast Exam:    Deferred to GYN  Abdomen:     Soft, non-tender, bowel sounds active all four quadrants,    no masses, no organomegaly  Genitalia:    Deferred to GYN  Rectal:    Extremities:   Extremities  normal, atraumatic, no cyanosis or edema  Pulses:   2+ and symmetric all extremities  Skin:   Skin color, texture, turgor normal, no rashes or lesions  Lymph nodes:   Cervical, supraclavicular, and axillary nodes normal  Neurologic:   CNII-XII intact, normal strength, sensation and reflexes    throughout          Assessment & Plan:  Snoring- new.  Given pt's reported fatigue will refer to pulmonary for OSA evaluation/workup.  Pt expressed understanding and is in agreement w/ plan.

## 2019-07-14 NOTE — Assessment & Plan Note (Signed)
Pt's PE WNL w/ exception of obesity.  UTD on mammo, colonoscopy, DEXA, immunizations.  Check labs.  Discussed need for healthy diet, regular exercise, COVID safety.

## 2019-07-15 ENCOUNTER — Other Ambulatory Visit: Payer: Self-pay | Admitting: Family Medicine

## 2019-07-15 DIAGNOSIS — R7989 Other specified abnormal findings of blood chemistry: Secondary | ICD-10-CM

## 2019-07-22 DIAGNOSIS — J3081 Allergic rhinitis due to animal (cat) (dog) hair and dander: Secondary | ICD-10-CM | POA: Diagnosis not present

## 2019-07-22 DIAGNOSIS — J3089 Other allergic rhinitis: Secondary | ICD-10-CM | POA: Diagnosis not present

## 2019-07-22 DIAGNOSIS — J301 Allergic rhinitis due to pollen: Secondary | ICD-10-CM | POA: Diagnosis not present

## 2019-07-23 ENCOUNTER — Ambulatory Visit: Payer: Medicare PPO

## 2019-07-30 ENCOUNTER — Ambulatory Visit (INDEPENDENT_AMBULATORY_CARE_PROVIDER_SITE_OTHER): Payer: Medicare PPO

## 2019-07-30 ENCOUNTER — Other Ambulatory Visit: Payer: Self-pay

## 2019-07-30 DIAGNOSIS — J3089 Other allergic rhinitis: Secondary | ICD-10-CM | POA: Diagnosis not present

## 2019-07-30 DIAGNOSIS — J301 Allergic rhinitis due to pollen: Secondary | ICD-10-CM | POA: Diagnosis not present

## 2019-07-30 DIAGNOSIS — J3081 Allergic rhinitis due to animal (cat) (dog) hair and dander: Secondary | ICD-10-CM | POA: Diagnosis not present

## 2019-07-30 DIAGNOSIS — R7989 Other specified abnormal findings of blood chemistry: Secondary | ICD-10-CM

## 2019-07-30 LAB — BASIC METABOLIC PANEL
BUN: 21 mg/dL (ref 6–23)
CO2: 26 mEq/L (ref 19–32)
Calcium: 9.9 mg/dL (ref 8.4–10.5)
Chloride: 109 mEq/L (ref 96–112)
Creatinine, Ser: 1.26 mg/dL — ABNORMAL HIGH (ref 0.40–1.20)
GFR: 42.52 mL/min — ABNORMAL LOW (ref >60.00)
Glucose, Bld: 91 mg/dL (ref 70–99)
Potassium: 4 mEq/L (ref 3.5–5.1)
Sodium: 136 mEq/L (ref 135–145)

## 2019-07-31 ENCOUNTER — Encounter: Payer: Self-pay | Admitting: Family Medicine

## 2019-08-03 ENCOUNTER — Other Ambulatory Visit: Payer: Self-pay | Admitting: General Practice

## 2019-08-03 MED ORDER — LEVOTHYROXINE SODIUM 88 MCG PO TABS: ORAL_TABLET | ORAL | 0 refills | Status: DC

## 2019-08-06 DIAGNOSIS — J3081 Allergic rhinitis due to animal (cat) (dog) hair and dander: Secondary | ICD-10-CM | POA: Diagnosis not present

## 2019-08-06 DIAGNOSIS — J301 Allergic rhinitis due to pollen: Secondary | ICD-10-CM | POA: Diagnosis not present

## 2019-08-06 DIAGNOSIS — J3089 Other allergic rhinitis: Secondary | ICD-10-CM | POA: Diagnosis not present

## 2019-08-21 DIAGNOSIS — J301 Allergic rhinitis due to pollen: Secondary | ICD-10-CM | POA: Diagnosis not present

## 2019-08-21 DIAGNOSIS — J3089 Other allergic rhinitis: Secondary | ICD-10-CM | POA: Diagnosis not present

## 2019-08-21 DIAGNOSIS — J3081 Allergic rhinitis due to animal (cat) (dog) hair and dander: Secondary | ICD-10-CM | POA: Diagnosis not present

## 2019-08-25 DIAGNOSIS — H25093 Other age-related incipient cataract, bilateral: Secondary | ICD-10-CM | POA: Diagnosis not present

## 2019-08-25 DIAGNOSIS — H52223 Regular astigmatism, bilateral: Secondary | ICD-10-CM | POA: Diagnosis not present

## 2019-08-25 DIAGNOSIS — H524 Presbyopia: Secondary | ICD-10-CM | POA: Diagnosis not present

## 2019-08-25 DIAGNOSIS — H5213 Myopia, bilateral: Secondary | ICD-10-CM | POA: Diagnosis not present

## 2019-08-31 DIAGNOSIS — J301 Allergic rhinitis due to pollen: Secondary | ICD-10-CM | POA: Diagnosis not present

## 2019-08-31 DIAGNOSIS — J3081 Allergic rhinitis due to animal (cat) (dog) hair and dander: Secondary | ICD-10-CM | POA: Diagnosis not present

## 2019-08-31 DIAGNOSIS — J3089 Other allergic rhinitis: Secondary | ICD-10-CM | POA: Diagnosis not present

## 2019-09-09 DIAGNOSIS — J3081 Allergic rhinitis due to animal (cat) (dog) hair and dander: Secondary | ICD-10-CM | POA: Diagnosis not present

## 2019-09-09 DIAGNOSIS — J301 Allergic rhinitis due to pollen: Secondary | ICD-10-CM | POA: Diagnosis not present

## 2019-09-09 DIAGNOSIS — J3089 Other allergic rhinitis: Secondary | ICD-10-CM | POA: Diagnosis not present

## 2019-09-11 DIAGNOSIS — J3089 Other allergic rhinitis: Secondary | ICD-10-CM | POA: Diagnosis not present

## 2019-09-11 DIAGNOSIS — J301 Allergic rhinitis due to pollen: Secondary | ICD-10-CM | POA: Diagnosis not present

## 2019-09-11 DIAGNOSIS — J3081 Allergic rhinitis due to animal (cat) (dog) hair and dander: Secondary | ICD-10-CM | POA: Diagnosis not present

## 2019-09-14 DIAGNOSIS — J3081 Allergic rhinitis due to animal (cat) (dog) hair and dander: Secondary | ICD-10-CM | POA: Diagnosis not present

## 2019-09-14 DIAGNOSIS — J301 Allergic rhinitis due to pollen: Secondary | ICD-10-CM | POA: Diagnosis not present

## 2019-09-14 DIAGNOSIS — J3089 Other allergic rhinitis: Secondary | ICD-10-CM | POA: Diagnosis not present

## 2019-09-17 DIAGNOSIS — J301 Allergic rhinitis due to pollen: Secondary | ICD-10-CM | POA: Diagnosis not present

## 2019-09-17 DIAGNOSIS — J3081 Allergic rhinitis due to animal (cat) (dog) hair and dander: Secondary | ICD-10-CM | POA: Diagnosis not present

## 2019-09-17 DIAGNOSIS — J3089 Other allergic rhinitis: Secondary | ICD-10-CM | POA: Diagnosis not present

## 2019-09-21 DIAGNOSIS — J3089 Other allergic rhinitis: Secondary | ICD-10-CM | POA: Diagnosis not present

## 2019-09-21 DIAGNOSIS — J3081 Allergic rhinitis due to animal (cat) (dog) hair and dander: Secondary | ICD-10-CM | POA: Diagnosis not present

## 2019-09-21 DIAGNOSIS — J301 Allergic rhinitis due to pollen: Secondary | ICD-10-CM | POA: Diagnosis not present

## 2019-09-23 DIAGNOSIS — Z1231 Encounter for screening mammogram for malignant neoplasm of breast: Secondary | ICD-10-CM | POA: Diagnosis not present

## 2019-09-23 DIAGNOSIS — Z779 Other contact with and (suspected) exposures hazardous to health: Secondary | ICD-10-CM | POA: Diagnosis not present

## 2019-09-23 DIAGNOSIS — Z6831 Body mass index (BMI) 31.0-31.9, adult: Secondary | ICD-10-CM | POA: Diagnosis not present

## 2019-09-23 DIAGNOSIS — J3081 Allergic rhinitis due to animal (cat) (dog) hair and dander: Secondary | ICD-10-CM | POA: Diagnosis not present

## 2019-09-23 DIAGNOSIS — J301 Allergic rhinitis due to pollen: Secondary | ICD-10-CM | POA: Diagnosis not present

## 2019-09-23 DIAGNOSIS — Z124 Encounter for screening for malignant neoplasm of cervix: Secondary | ICD-10-CM | POA: Diagnosis not present

## 2019-09-23 DIAGNOSIS — J3089 Other allergic rhinitis: Secondary | ICD-10-CM | POA: Diagnosis not present

## 2019-09-23 DIAGNOSIS — N870 Mild cervical dysplasia: Secondary | ICD-10-CM | POA: Diagnosis not present

## 2019-09-23 LAB — HM MAMMOGRAPHY

## 2019-09-23 LAB — HM PAP SMEAR: HM Pap smear: NEGATIVE

## 2019-09-24 ENCOUNTER — Encounter: Payer: Self-pay | Admitting: Family Medicine

## 2019-09-25 NOTE — Telephone Encounter (Signed)
FYI

## 2019-09-28 DIAGNOSIS — J3089 Other allergic rhinitis: Secondary | ICD-10-CM | POA: Diagnosis not present

## 2019-09-28 DIAGNOSIS — J301 Allergic rhinitis due to pollen: Secondary | ICD-10-CM | POA: Diagnosis not present

## 2019-09-28 DIAGNOSIS — J3081 Allergic rhinitis due to animal (cat) (dog) hair and dander: Secondary | ICD-10-CM | POA: Diagnosis not present

## 2019-10-01 DIAGNOSIS — J301 Allergic rhinitis due to pollen: Secondary | ICD-10-CM | POA: Diagnosis not present

## 2019-10-01 DIAGNOSIS — J3089 Other allergic rhinitis: Secondary | ICD-10-CM | POA: Diagnosis not present

## 2019-10-01 DIAGNOSIS — J3081 Allergic rhinitis due to animal (cat) (dog) hair and dander: Secondary | ICD-10-CM | POA: Diagnosis not present

## 2019-10-08 DIAGNOSIS — J301 Allergic rhinitis due to pollen: Secondary | ICD-10-CM | POA: Diagnosis not present

## 2019-10-08 DIAGNOSIS — J3089 Other allergic rhinitis: Secondary | ICD-10-CM | POA: Diagnosis not present

## 2019-10-08 DIAGNOSIS — J3081 Allergic rhinitis due to animal (cat) (dog) hair and dander: Secondary | ICD-10-CM | POA: Diagnosis not present

## 2019-10-19 DIAGNOSIS — J3089 Other allergic rhinitis: Secondary | ICD-10-CM | POA: Diagnosis not present

## 2019-10-19 DIAGNOSIS — J301 Allergic rhinitis due to pollen: Secondary | ICD-10-CM | POA: Diagnosis not present

## 2019-10-19 DIAGNOSIS — J3081 Allergic rhinitis due to animal (cat) (dog) hair and dander: Secondary | ICD-10-CM | POA: Diagnosis not present

## 2019-10-21 DIAGNOSIS — J3089 Other allergic rhinitis: Secondary | ICD-10-CM | POA: Diagnosis not present

## 2019-10-21 DIAGNOSIS — J301 Allergic rhinitis due to pollen: Secondary | ICD-10-CM | POA: Diagnosis not present

## 2019-10-21 DIAGNOSIS — J3081 Allergic rhinitis due to animal (cat) (dog) hair and dander: Secondary | ICD-10-CM | POA: Diagnosis not present

## 2019-10-28 DIAGNOSIS — J3081 Allergic rhinitis due to animal (cat) (dog) hair and dander: Secondary | ICD-10-CM | POA: Diagnosis not present

## 2019-10-28 DIAGNOSIS — J301 Allergic rhinitis due to pollen: Secondary | ICD-10-CM | POA: Diagnosis not present

## 2019-10-28 DIAGNOSIS — J3089 Other allergic rhinitis: Secondary | ICD-10-CM | POA: Diagnosis not present

## 2019-10-29 ENCOUNTER — Other Ambulatory Visit: Payer: Self-pay | Admitting: Family Medicine

## 2019-11-01 ENCOUNTER — Encounter: Payer: Self-pay | Admitting: Family Medicine

## 2019-11-09 DIAGNOSIS — J3081 Allergic rhinitis due to animal (cat) (dog) hair and dander: Secondary | ICD-10-CM | POA: Diagnosis not present

## 2019-11-09 DIAGNOSIS — J3089 Other allergic rhinitis: Secondary | ICD-10-CM | POA: Diagnosis not present

## 2019-11-09 DIAGNOSIS — J301 Allergic rhinitis due to pollen: Secondary | ICD-10-CM | POA: Diagnosis not present

## 2019-11-10 ENCOUNTER — Encounter: Payer: Self-pay | Admitting: Podiatry

## 2019-11-10 ENCOUNTER — Other Ambulatory Visit: Payer: Self-pay

## 2019-11-10 ENCOUNTER — Ambulatory Visit (INDEPENDENT_AMBULATORY_CARE_PROVIDER_SITE_OTHER): Payer: Medicare PPO | Admitting: Podiatry

## 2019-11-10 ENCOUNTER — Ambulatory Visit (INDEPENDENT_AMBULATORY_CARE_PROVIDER_SITE_OTHER): Payer: Medicare PPO

## 2019-11-10 ENCOUNTER — Encounter: Payer: Self-pay | Admitting: Family Medicine

## 2019-11-10 DIAGNOSIS — M2042 Other hammer toe(s) (acquired), left foot: Secondary | ICD-10-CM

## 2019-11-10 NOTE — Progress Notes (Signed)
She presents today chief complaint of painful contracted toes bilaterally.  She is stating that the fifth toe starting to overlap the fourth toe at this point.  She states that this foot does not look like this foot as she refers to the left over the right.  She states is starting to affect her ability to wear regular shoes and work out and control her weight and health.  ROS: Denies fever chills nausea vomiting muscle aches pains calf pain back pain chest pain shortness of breath.  Discussed past medical history medications and allergies.  Objective: Vital signs are stable alert oriented x3.  Pulses are palpable.  Neurologic sensorium is intact deep tendon reflexes are intact muscle strength is normal symmetrical.  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  Overlapping fifth digit left foot.  Has been this way for quite some time according to the tension on the extensor tendon.  Radiographs taken today do demonstrate osteoarthritic changes and osteopenia at the metatarsophalangeal joints as well as the PIPJ's.  Overlapping fifth digit is also noted.  Mild tailor's bunion deformity noted.  Assessment: Overlapping fifth digit left foot painful in nature.  Contracted hammertoe deformities 2345 left.  Plan: Discussed etiology pathology conservative versus surgical therapies at this point we will try to consent her for tenotomies and a skin plasty overlying the fifth metatarsophalangeal joint which will be released and pinned.  This should help alleviate the symptoms quite nicely.  We did discuss the possible need for hammertoe repair of all of the toes with pins themselves.  She understands and is amenable to it.  Provided her with information regarding the surgery center anesthesia group and instructions for the morning of surgery.  She understands this is amenable to it she already has a cam boot at home and I will follow-up with her in  the near future for surgical intervention.

## 2019-11-11 ENCOUNTER — Encounter: Payer: Self-pay | Admitting: General Practice

## 2019-11-26 DIAGNOSIS — J3089 Other allergic rhinitis: Secondary | ICD-10-CM | POA: Diagnosis not present

## 2019-11-26 DIAGNOSIS — J301 Allergic rhinitis due to pollen: Secondary | ICD-10-CM | POA: Diagnosis not present

## 2019-11-26 DIAGNOSIS — J3081 Allergic rhinitis due to animal (cat) (dog) hair and dander: Secondary | ICD-10-CM | POA: Diagnosis not present

## 2019-11-27 ENCOUNTER — Telehealth: Payer: Self-pay

## 2019-11-27 NOTE — Telephone Encounter (Signed)
DOS 12/18/2019  TENOTOMY 2-4 LT - 28010 RUIZ-MORA COCKS IN TOE 5TH LT - 82800 POSS. HAMMERTOE REPAIR 2-4 LT - 28285  NO AUTH REQUIRED FOR CPT 28010 & 28296  Procedure [id number] Correction of toe joint deformity 28285 Correction of toe joint deformity 28285 Correction of toe joint deformity Units n/a Diagnosis M20.42 Other hammer toe(s) (acquired), left foot (primary) Confirmation date Dec 18, 2019 - Jan 17, 2020 [place] authorization number [id number]

## 2019-12-15 DIAGNOSIS — J3089 Other allergic rhinitis: Secondary | ICD-10-CM | POA: Diagnosis not present

## 2019-12-15 DIAGNOSIS — J3081 Allergic rhinitis due to animal (cat) (dog) hair and dander: Secondary | ICD-10-CM | POA: Diagnosis not present

## 2019-12-15 DIAGNOSIS — J301 Allergic rhinitis due to pollen: Secondary | ICD-10-CM | POA: Diagnosis not present

## 2019-12-15 DIAGNOSIS — R059 Cough, unspecified: Secondary | ICD-10-CM | POA: Diagnosis not present

## 2019-12-16 ENCOUNTER — Other Ambulatory Visit: Payer: Self-pay | Admitting: Podiatry

## 2019-12-16 MED ORDER — ONDANSETRON HCL 4 MG PO TABS: 4.0000 mg | ORAL_TABLET | Freq: Three times a day (TID) | ORAL | 0 refills | Status: DC | PRN

## 2019-12-16 MED ORDER — CEPHALEXIN 500 MG PO CAPS: 500.0000 mg | ORAL_CAPSULE | Freq: Three times a day (TID) | ORAL | 0 refills | Status: DC

## 2019-12-16 MED ORDER — HYDROCODONE-ACETAMINOPHEN 10-325 MG PO TABS
1.0000 | ORAL_TABLET | Freq: Four times a day (QID) | ORAL | 0 refills | Status: AC | PRN
Start: 2019-12-16 — End: 2019-12-23

## 2019-12-18 DIAGNOSIS — M25572 Pain in left ankle and joints of left foot: Secondary | ICD-10-CM | POA: Diagnosis not present

## 2019-12-18 DIAGNOSIS — M2042 Other hammer toe(s) (acquired), left foot: Secondary | ICD-10-CM | POA: Diagnosis not present

## 2019-12-18 DIAGNOSIS — M24575 Contracture, left foot: Secondary | ICD-10-CM | POA: Diagnosis not present

## 2019-12-20 ENCOUNTER — Encounter: Payer: Self-pay | Admitting: Podiatry

## 2019-12-21 ENCOUNTER — Encounter: Payer: Self-pay | Admitting: Family Medicine

## 2019-12-24 ENCOUNTER — Ambulatory Visit (INDEPENDENT_AMBULATORY_CARE_PROVIDER_SITE_OTHER): Payer: Medicare PPO | Admitting: Podiatry

## 2019-12-24 ENCOUNTER — Other Ambulatory Visit: Payer: Self-pay

## 2019-12-24 ENCOUNTER — Ambulatory Visit (INDEPENDENT_AMBULATORY_CARE_PROVIDER_SITE_OTHER): Payer: Medicare PPO

## 2019-12-24 ENCOUNTER — Encounter: Payer: Self-pay | Admitting: Podiatry

## 2019-12-24 DIAGNOSIS — J3089 Other allergic rhinitis: Secondary | ICD-10-CM | POA: Diagnosis not present

## 2019-12-24 DIAGNOSIS — M2042 Other hammer toe(s) (acquired), left foot: Secondary | ICD-10-CM

## 2019-12-24 DIAGNOSIS — J3081 Allergic rhinitis due to animal (cat) (dog) hair and dander: Secondary | ICD-10-CM | POA: Diagnosis not present

## 2019-12-24 DIAGNOSIS — Z9889 Other specified postprocedural states: Secondary | ICD-10-CM

## 2019-12-24 DIAGNOSIS — J301 Allergic rhinitis due to pollen: Secondary | ICD-10-CM | POA: Diagnosis not present

## 2019-12-24 NOTE — Progress Notes (Signed)
She presents today for her first postop visit date of surgery 12/18/2019 status post hammertoe repair and release of the fifth metatarsophalangeal joint and skin plasty.  She denies fever chills nausea vomit muscle aches and pains.  Objective: Vital signs are stable she is alert and oriented x3 dry sterile dressing intact was removed demonstrates no erythema cellulitis drainage or odor toes are rectus.  All we had to do actually was flexor tenotomies and 1 extensor tenotomy with flexor tenotomy to the fifth digit left foot.  She tolerated this very well and she has a nice rectus forefoot.  She is very happy with the outcome.  Assessment: Well-healing surgical foot.  No signs of infection.  Plan: Redressed the foot today dressed a compressive dressing we will have her to follow-up with [place] in 1 week at which time sutures will be removed.  I did dispense a Darco shoe today to help with the weight of the cam boot.

## 2019-12-29 ENCOUNTER — Ambulatory Visit (INDEPENDENT_AMBULATORY_CARE_PROVIDER_SITE_OTHER): Payer: Medicare PPO | Admitting: Podiatry

## 2019-12-29 ENCOUNTER — Encounter: Payer: Self-pay | Admitting: Podiatry

## 2019-12-29 ENCOUNTER — Other Ambulatory Visit: Payer: Self-pay

## 2019-12-29 DIAGNOSIS — J3089 Other allergic rhinitis: Secondary | ICD-10-CM | POA: Diagnosis not present

## 2019-12-29 DIAGNOSIS — Z9889 Other specified postprocedural states: Secondary | ICD-10-CM

## 2019-12-29 DIAGNOSIS — M2042 Other hammer toe(s) (acquired), left foot: Secondary | ICD-10-CM

## 2019-12-29 DIAGNOSIS — J3081 Allergic rhinitis due to animal (cat) (dog) hair and dander: Secondary | ICD-10-CM | POA: Diagnosis not present

## 2019-12-29 DIAGNOSIS — J301 Allergic rhinitis due to pollen: Secondary | ICD-10-CM | POA: Diagnosis not present

## 2019-12-29 NOTE — Progress Notes (Signed)
She presents today second postop visit status post release of the flexor tendons extensor tendons and the fifth metatarsophalangeal joint left foot.  She denies fever chills nausea vomiting muscle aches and pains.  Objective: Vital signs stable alert oriented x3 there is no erythematous mild edema no cellulitis drainage or odor sutures are intact was removed demonstrates no erythema no dehiscence no drainage.  Assessment: Rectus foot.  Status post tenotomy's.    Plan: Recommended that she try to get back into her some regular shoe gear.  I will follow-up with her in the next few weeks.

## 2020-01-05 ENCOUNTER — Encounter: Payer: Medicare PPO | Admitting: Podiatry

## 2020-01-08 ENCOUNTER — Encounter: Payer: Self-pay | Admitting: Family Medicine

## 2020-01-13 ENCOUNTER — Ambulatory Visit: Payer: Medicare PPO | Admitting: Family Medicine

## 2020-01-15 DIAGNOSIS — J3089 Other allergic rhinitis: Secondary | ICD-10-CM | POA: Diagnosis not present

## 2020-01-15 DIAGNOSIS — J301 Allergic rhinitis due to pollen: Secondary | ICD-10-CM | POA: Diagnosis not present

## 2020-01-15 DIAGNOSIS — J3081 Allergic rhinitis due to animal (cat) (dog) hair and dander: Secondary | ICD-10-CM | POA: Diagnosis not present

## 2020-01-19 ENCOUNTER — Other Ambulatory Visit: Payer: Self-pay

## 2020-01-19 ENCOUNTER — Ambulatory Visit (INDEPENDENT_AMBULATORY_CARE_PROVIDER_SITE_OTHER): Payer: Medicare PPO | Admitting: Podiatry

## 2020-01-19 ENCOUNTER — Encounter: Payer: Self-pay | Admitting: Podiatry

## 2020-01-19 ENCOUNTER — Ambulatory Visit: Payer: Medicare PPO

## 2020-01-19 DIAGNOSIS — M2042 Other hammer toe(s) (acquired), left foot: Secondary | ICD-10-CM

## 2020-01-19 DIAGNOSIS — Z9889 Other specified postprocedural states: Secondary | ICD-10-CM

## 2020-01-19 NOTE — Progress Notes (Signed)
She presents today for follow-up of her tenotomy's date of surgery is 12/18/2019 states that she still has some numbness and tingling but otherwise she is doing quite well.  Objective: Vital signs stable alert oriented x3 she no longer has pain on palpation of her metatarsophalangeal joints her toes are sitting rectus with weightbearing.  Assessment: Well-healing surgical foot.  Some neuropathy associated with cavus deformity.  Plan: Follow-up with her in 6 weeks encouraged massage therapy and appropriate shoe gear.

## 2020-01-27 ENCOUNTER — Other Ambulatory Visit: Payer: Self-pay | Admitting: Family Medicine

## 2020-01-27 DIAGNOSIS — J301 Allergic rhinitis due to pollen: Secondary | ICD-10-CM | POA: Diagnosis not present

## 2020-01-27 DIAGNOSIS — J3089 Other allergic rhinitis: Secondary | ICD-10-CM | POA: Diagnosis not present

## 2020-01-27 DIAGNOSIS — J3081 Allergic rhinitis due to animal (cat) (dog) hair and dander: Secondary | ICD-10-CM | POA: Diagnosis not present

## 2020-02-02 ENCOUNTER — Encounter: Payer: Medicare PPO | Admitting: Podiatry

## 2020-02-04 ENCOUNTER — Other Ambulatory Visit: Payer: Self-pay

## 2020-02-04 ENCOUNTER — Ambulatory Visit: Payer: Medicare PPO | Admitting: Family Medicine

## 2020-02-04 ENCOUNTER — Encounter: Payer: Self-pay | Admitting: Family Medicine

## 2020-02-04 VITALS — BP 124/76 | HR 76 | Temp 97.9°F | Resp 16 | Ht 64.0 in | Wt 194.2 lb

## 2020-02-04 DIAGNOSIS — E669 Obesity, unspecified: Secondary | ICD-10-CM

## 2020-02-04 DIAGNOSIS — E785 Hyperlipidemia, unspecified: Secondary | ICD-10-CM | POA: Diagnosis not present

## 2020-02-04 DIAGNOSIS — E038 Other specified hypothyroidism: Secondary | ICD-10-CM

## 2020-02-04 LAB — CBC WITH DIFFERENTIAL/PLATELET
Basophils Absolute: 0.1 10*3/uL (ref 0.0–0.1)
Basophils Relative: 1.3 % (ref 0.0–3.0)
Eosinophils Absolute: 0.3 10*3/uL (ref 0.0–0.7)
Eosinophils Relative: 3.9 % (ref 0.0–5.0)
HCT: 44.7 % (ref 36.0–46.0)
Hemoglobin: 14.9 g/dL (ref 12.0–15.0)
Lymphocytes Relative: 33.1 % (ref 12.0–46.0)
Lymphs Abs: 2.5 10*3/uL (ref 0.7–4.0)
MCHC: 33.4 g/dL (ref 30.0–36.0)
MCV: 101.1 fl — ABNORMAL HIGH (ref 78.0–100.0)
Monocytes Absolute: 0.8 10*3/uL (ref 0.1–1.0)
Monocytes Relative: 10.9 % (ref 3.0–12.0)
Neutro Abs: 3.8 10*3/uL (ref 1.4–7.7)
Neutrophils Relative %: 50.8 % (ref 43.0–77.0)
Platelets: 296 10*3/uL (ref 150.0–400.0)
RBC: 4.42 Mil/uL (ref 3.87–5.11)
RDW: 12.8 % (ref 11.5–15.5)
WBC: 7.4 10*3/uL (ref 4.0–10.5)

## 2020-02-04 LAB — BASIC METABOLIC PANEL
BUN: 27 mg/dL — ABNORMAL HIGH (ref 6–23)
CO2: 27 mEq/L (ref 19–32)
Calcium: 9.9 mg/dL (ref 8.4–10.5)
Chloride: 101 mEq/L (ref 96–112)
Creatinine, Ser: 1.35 mg/dL — ABNORMAL HIGH (ref 0.40–1.20)
GFR: 41.01 mL/min — ABNORMAL LOW (ref >60.00)
Glucose, Bld: 98 mg/dL (ref 70–99)
Potassium: 4.4 mEq/L (ref 3.5–5.1)
Sodium: 136 mEq/L (ref 135–145)

## 2020-02-04 LAB — LIPID PANEL
Cholesterol: 200 mg/dL (ref 0–200)
HDL: 66.2 mg/dL (ref >39.00)
LDL Cholesterol: 104 mg/dL — ABNORMAL HIGH (ref 0–99)
NonHDL: 133.39
Total CHOL/HDL Ratio: 3
Triglycerides: 146 mg/dL (ref 0.0–149.0)
VLDL: 29.2 mg/dL (ref 0.0–40.0)

## 2020-02-04 LAB — HEPATIC FUNCTION PANEL
ALT: 11 U/L (ref 0–35)
AST: 18 U/L (ref 0–37)
Albumin: 4.5 g/dL (ref 3.5–5.2)
Alkaline Phosphatase: 108 U/L (ref 39–117)
Bilirubin, Direct: 0.1 mg/dL (ref 0.0–0.3)
Total Bilirubin: 0.6 mg/dL (ref 0.2–1.2)
Total Protein: 6.9 g/dL (ref 6.0–8.3)

## 2020-02-04 LAB — TSH: TSH: 2.55 u[IU]/mL (ref 0.35–4.50)

## 2020-02-04 NOTE — Assessment & Plan Note (Signed)
Pt is currently asymptomatic on Levothyroxine daily.  Check labs.  Adjust meds prn

## 2020-02-04 NOTE — Progress Notes (Signed)
   Subjective:    Patient ID: [name], female    DOB: Sep 07, 1955, 64 y.o.   MRN: [id number]  HPI Hypothyroid- chronic problem, on Levothyroxine daily.  Denies excessive fatigue, changes to skin/hair/nails  Hyperlipidemia- chronic problem, on Simvastatin 10mg  daily.  Pt has gained 10 lbs since last visit.  Denies abd pain, N/V  Obesity- pt has gained 10 lbs since last visit.  BMI is now 33.33.  Has not been active recently due to foot surgery   Review of Systems For ROS see HPI   This visit occurred during the SARS-CoV-2 public health emergency.  Safety protocols were in place, including screening questions prior to the visit, additional usage of staff PPE, and extensive cleaning of exam room while observing appropriate contact time as indicated for disinfecting solutions.       Objective:   Physical Exam Vitals reviewed.  Constitutional:      General: She is not in acute distress.    Appearance: Normal appearance. She is well-developed and well-nourished. She is not ill-appearing.  HENT:     Head: Normocephalic and atraumatic.  Eyes:     Extraocular Movements: EOM normal.     Conjunctiva/sclera: Conjunctivae normal.     Pupils: Pupils are equal, round, and reactive to light.  Neck:     Thyroid: No thyromegaly.  Cardiovascular:     Rate and Rhythm: Normal rate and regular rhythm.     Pulses: Normal pulses and intact distal pulses.     Heart sounds: Normal heart sounds. No murmur heard.   Pulmonary:     Effort: Pulmonary effort is normal. No respiratory distress.     Breath sounds: Normal breath sounds.  Abdominal:     General: There is no distension.     Palpations: Abdomen is soft.     Tenderness: There is no abdominal tenderness.  Musculoskeletal:        General: No edema.     Cervical back: Normal range of motion and neck supple.     Right lower leg: No edema.     Left lower leg: No edema.  Lymphadenopathy:     Cervical: No cervical adenopathy.   Skin:    General: Skin is warm and dry.  Neurological:     Mental Status: She is alert and oriented to person, place, and time.  Psychiatric:        Mood and Affect: Mood and affect normal.        Behavior: Behavior normal.           Assessment & Plan:

## 2020-02-04 NOTE — Assessment & Plan Note (Signed)
Deteriorated.  Pt has gained 10 lbs since last visit.  Discussed need for healthy diet and regular exercise.  Check labs to risk stratify.  Will follow.

## 2020-02-04 NOTE — Patient Instructions (Addendum)
Schedule your complete physical in 6 months We'll notify you of your lab results and make any changes if needed Continue to work on healthy diet and regular exercise- you can do it! Call with any questions or concerns Stay Safe!  Stay Healthy! Happy New Year!!! 

## 2020-02-04 NOTE — Assessment & Plan Note (Signed)
Chronic problem.  Tolerating Simvastatin 10mg  daily.  Check labs.  Adjust meds prn

## 2020-02-12 DIAGNOSIS — J3089 Other allergic rhinitis: Secondary | ICD-10-CM | POA: Diagnosis not present

## 2020-02-12 DIAGNOSIS — J3081 Allergic rhinitis due to animal (cat) (dog) hair and dander: Secondary | ICD-10-CM | POA: Diagnosis not present

## 2020-02-12 DIAGNOSIS — J301 Allergic rhinitis due to pollen: Secondary | ICD-10-CM | POA: Diagnosis not present

## 2020-02-15 ENCOUNTER — Other Ambulatory Visit (INDEPENDENT_AMBULATORY_CARE_PROVIDER_SITE_OTHER): Payer: Medicare PPO

## 2020-02-15 ENCOUNTER — Other Ambulatory Visit: Payer: Self-pay

## 2020-02-15 ENCOUNTER — Ambulatory Visit: Payer: Medicare PPO

## 2020-02-15 DIAGNOSIS — R7989 Other specified abnormal findings of blood chemistry: Secondary | ICD-10-CM

## 2020-02-15 LAB — BASIC METABOLIC PANEL
BUN: 18 mg/dL (ref 6–23)
CO2: 25 mEq/L (ref 19–32)
Calcium: 9.9 mg/dL (ref 8.4–10.5)
Chloride: 103 mEq/L (ref 96–112)
Creatinine, Ser: 1.35 mg/dL — ABNORMAL HIGH (ref 0.40–1.20)
GFR: 41.01 mL/min — ABNORMAL LOW (ref >60.00)
Glucose, Bld: 90 mg/dL (ref 70–99)
Potassium: 3.9 mEq/L (ref 3.5–5.1)
Sodium: 137 mEq/L (ref 135–145)

## 2020-02-15 NOTE — Addendum Note (Signed)
Addended by: Eddie North C on: 02/15/2020 10:27 AM   Modules accepted: Orders

## 2020-02-17 NOTE — Progress Notes (Signed)
Pt called back for lab results. Reported lab results. Pt stated that she is drinking plenty of fluids and she keep a bottle of water with her wherever she goes. She stated that she is no taking NSAID regulary, she said maybe 1-2 time per year. She is taking the Spironolactone, she tried to get dow to once/day instead of BID, she said it didn't help her Acne and she went back to taking it BID. She said that she is scheduled to see Derm in [place] and she will bring it to their attention to seen possible adjustment.  Reminded her of her scheduled physical with PCP.

## 2020-02-24 DIAGNOSIS — J301 Allergic rhinitis due to pollen: Secondary | ICD-10-CM | POA: Diagnosis not present

## 2020-02-24 DIAGNOSIS — J3081 Allergic rhinitis due to animal (cat) (dog) hair and dander: Secondary | ICD-10-CM | POA: Diagnosis not present

## 2020-02-24 DIAGNOSIS — J3089 Other allergic rhinitis: Secondary | ICD-10-CM | POA: Diagnosis not present

## 2020-03-01 ENCOUNTER — Encounter: Payer: Self-pay | Admitting: Podiatry

## 2020-03-01 ENCOUNTER — Ambulatory Visit (INDEPENDENT_AMBULATORY_CARE_PROVIDER_SITE_OTHER): Payer: Medicare PPO | Admitting: Podiatry

## 2020-03-01 ENCOUNTER — Other Ambulatory Visit: Payer: Self-pay

## 2020-03-01 DIAGNOSIS — Z9889 Other specified postprocedural states: Secondary | ICD-10-CM

## 2020-03-01 DIAGNOSIS — M2042 Other hammer toe(s) (acquired), left foot: Secondary | ICD-10-CM

## 2020-03-01 NOTE — Progress Notes (Signed)
She presents today date of surgery 2019-12-18 release and pinning of the fifth metatarsal with skin plasty.  Tenotomy 234.  States that she really cannot bend her toes down all the way and gets little sore when she is walking.  Objective: Vital signs are stable alert and oriented x3.  Pulses are palpable.  There is no erythema no edema cellulitis drainage or odor.  She does have some tightness in her extensor tendons to toes #2 #3 #4 but she is able to plantarflex her toes past neutral position.  She still has good muscle strength she is just not able to bend the toes completely like she can on her right foot.  Assessment well-healing surgical foot.  Plan: She will follow up with me on an as-needed basis I encouraged her this is to continue to be sore and swollen numbing feeling at times until her toes.  Different and they will continue to progressively improve for about another 4 to 6 months.  Should she have questions or concerns or the extensor tendons bother her she will notify us immediately.

## 2020-03-11 DIAGNOSIS — Z1159 Encounter for screening for other viral diseases: Secondary | ICD-10-CM | POA: Diagnosis not present

## 2020-03-16 ENCOUNTER — Encounter: Payer: Self-pay | Admitting: Physician Assistant

## 2020-03-16 ENCOUNTER — Encounter: Payer: Self-pay | Admitting: Family Medicine

## 2020-03-16 ENCOUNTER — Telehealth (INDEPENDENT_AMBULATORY_CARE_PROVIDER_SITE_OTHER): Payer: Medicare PPO | Admitting: Physician Assistant

## 2020-03-16 DIAGNOSIS — U071 COVID-19: Secondary | ICD-10-CM

## 2020-03-16 DIAGNOSIS — J329 Chronic sinusitis, unspecified: Secondary | ICD-10-CM | POA: Diagnosis not present

## 2020-03-16 MED ORDER — AZITHROMYCIN 250 MG PO TABS
ORAL_TABLET | ORAL | 0 refills | Status: DC
Start: 2020-03-16 — End: 2021-03-13

## 2020-03-16 NOTE — Progress Notes (Signed)
Virtual Visit via Video   I connected with Marcia Adams on 03/16/20 at 12:30 PM EST by a video enabled telemedicine application and verified that I am speaking with the correct person using two identifiers. Location patient: Home Location provider: [facility], Office Persons participating in the virtual visit: Marcia Adams, [employer]  I discussed the limitations of evaluation and management by telemedicine and the availability of in person appointments. The patient expressed understanding and agreed to proceed.   Subjective:   HPI:   Sinusitis with COVID-19 Started coughing about 5 weeks ago. Has been seeing allergist for 37 years. Gets a sinus infection every single February. Currently taking care of a neighbors dog and spending a lot of time outdoors. She has known allergy to cats, dogs and pollens. She is getting her regular allergy shots with Dr. Donneta Adams.  Can hear some crackling in her ears and has pressure under eyes from her sinuses. No fever, chills, SOB, chest pain.  03/11/20 had positive COVID-19 test. She had likely gotten this from her family that she visited in [place]. No prior hx of asthma or COPD.  Patient risk factors: Current [id number] risk of complications score: 3 Smoking status: Marcia Adams  reports that she has never smoked. She has never used smokeless tobacco. If female, currently pregnant? []   Yes [x]   No  ROS: See pertinent positives and negatives per HPI.  Patient Active Problem List   Diagnosis Date Noted  . Physical exam 12/10/2016  . Pain of left thumb 01/10/2015  . Bicipital tendinitis of left shoulder 12/15/2013  . Bicipital tendinitis of right shoulder 12/15/2013  . ASCUS with positive high risk HPV  Dr. Lynnette Caffey 10/2013 10/19/2013  . Retinal detachment 09/30/2012  . Diverticulitis 09/25/2012  . Hypercalcemia 09/17/2012  . 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 Postmenopausal atrophic vaginitis 05/26/2011  . Herpes 04/24/2011   . Anemia   . Anxiety   . Hypothyroidism 04/23/2007  . Carpal tunnel syndrome 10/22/2006  . Benign neoplasm of skin 02/27/2006  . Pain, neck 02/27/2006  . Paresthesia 02/27/2006  . Reflux esophagitis 02/20/2005  . Obesity (BMI 30-39.9) 10/04/2004  . Depression 11/10/2003  . Allergic rhinitis, cause unspecified 11/10/2003  . Calculus of ureter 11/10/2003  . Insomnia 11/10/2003  . Plantar fasciitis 03/04/2002  . Hyperlipidemia 01/14/2002  . Pain in the chest 12/03/2001  . Pain, abdominal, epigastric 12/03/2001  . Hemorrhoids, external 03/02/1999  . HTN (hypertension) 01/24/1999  . Allergic rhinitis due to dust 01/24/1999    Social History   Tobacco Use  . Smoking status: Never Smoker  . Smokeless tobacco: Never Used  Substance Use Topics  . Alcohol use: Not Currently    Comment: occasionally    Current Outpatient Medications:  .  Azelastine-Fluticasone 137-50 MCG/ACT SUSP, Place 2 sprays into the nose 2 (two) times daily., Disp: , Rfl:  .  buPROPion (WELLBUTRIN XL) 150 MG 24 hr tablet, TK ONE T PO D UTD, Disp: , Rfl:  .  chlordiazePOXIDE (LIBRIUM) 5 MG capsule, , Disp: , Rfl:  .  clindamycin (CLINDAGEL) 1 % gel, Apply topically daily. , Disp: , Rfl:  .  EPIPEN 2-PAK 0.3 MG/0.3ML DEVI, Inject 0.3 mg into the muscle daily as needed (allergic reaction). , Disp: , Rfl:  .  Eszopiclone 3 MG TABS, Take 3 mg by mouth at bedtime. Take immediately before bedtime, Disp: , Rfl:  .  L-Methylfolate 15 MG TABS, , Disp: , Rfl:  .  levothyroxine (  SYNTHROID) 88 MCG tablet, TAKE 1 TABLET(88 MCG) BY MOUTH DAILY, Disp: 90 tablet, Rfl: 0 .  loratadine (CLARITIN) 10 MG tablet, Take 10 mg by mouth at bedtime., Disp: , Rfl:  .  montelukast (SINGULAIR) 10 MG tablet, TK 1 T PO QD IN THE EVE, Disp: , Rfl: 3 .  simvastatin (ZOCOR) 10 MG tablet, Take 1 tablet (10 mg total) by mouth at bedtime., Disp: 90 tablet, Rfl: 3 .  spironolactone (ALDACTONE) 50 MG tablet, Take 50 mg by mouth 2 (two) times daily. ,  Disp: , Rfl:  .  tazarotene (AVAGE) 0.1 % cream, Apply topically at bedtime., Disp: , Rfl:  .  valACYclovir (VALTREX) 500 MG tablet, , Disp: , Rfl:  .  vortioxetine HBr (TRINTELLIX) 20 MG TABS tablet, Take 20 mg by mouth daily., Disp: , Rfl:   Allergies  Allergen Reactions  . Penicillins Itching  . Ciprofloxacin Other (See Comments)    Retinal detachment, severe muscle pain; Pt got tendonitis   . Aripiprazole Other (See Comments)    Excessive weight gain  . Aripiprazole     Excessive weight gain  . Escitalopram Oxalate Other (See Comments)    Per pt "my legs hurt"  . Enalapril Rash    Objective:   VITALS: Per patient if applicable, see vitals. GENERAL: Alert, appears well and in no acute distress. HEENT: Atraumatic, conjunctiva clear, no obvious abnormalities on inspection of external nose and ears. NECK: Normal movements of the head and neck. CARDIOPULMONARY: No increased WOB. Speaking in clear sentences. I:E ratio WNL.  MS: Moves all visible extremities without noticeable abnormality. PSYCH: Pleasant and cooperative, well-groomed. Speech normal rate and rhythm. Affect is appropriate. Insight and judgement are appropriate. Attention is focused, linear, and appropriate.  NEURO: CN grossly intact. Oriented as arrived to appointment on time with no prompting. Moves both UE equally.  SKIN: No obvious lesions, wounds, erythema, or cyanosis noted on face or hands.  Assessment and Plan:   Diagnoses and all orders for this visit:  COVID-19  Recurrent sinusitis   No red flags on discussion, patient is not in any obvious distress during our visit. Discussed progression of most viral illnesses, and recommended supportive care at this point in time.  I did however provide pocket rx for oral azithromycin should symptoms not improve as anticipated.  Discussed over the counter supportive care options, including Tylenol 500 mg q 8 hours, with recommendations to push fluids and  rest. Reviewed return precautions including new/worsening fever, SOB, new/worsening cough, sudden onset changes of symptoms. Recommended need to self-quarantine and practice social distancing until symptoms resolve. I recommend that patient follow-up if symptoms worsen or persist despite treatment x 7-10 days, sooner if needed.  I discussed the assessment and treatment plan with the patient. The patient was provided an opportunity to ask questions and all were answered. The patient agreed with the plan and demonstrated an understanding of the instructions.   The patient was advised to call back or seek an in-person evaluation if the symptoms worsen or if the condition fails to improve as anticipated.   [place], [place] 03/16/2020

## 2020-03-22 DIAGNOSIS — J301 Allergic rhinitis due to pollen: Secondary | ICD-10-CM | POA: Diagnosis not present

## 2020-03-22 DIAGNOSIS — J3089 Other allergic rhinitis: Secondary | ICD-10-CM | POA: Diagnosis not present

## 2020-03-22 DIAGNOSIS — J3081 Allergic rhinitis due to animal (cat) (dog) hair and dander: Secondary | ICD-10-CM | POA: Diagnosis not present

## 2020-04-06 DIAGNOSIS — J301 Allergic rhinitis due to pollen: Secondary | ICD-10-CM | POA: Diagnosis not present

## 2020-04-06 DIAGNOSIS — J3081 Allergic rhinitis due to animal (cat) (dog) hair and dander: Secondary | ICD-10-CM | POA: Diagnosis not present

## 2020-04-06 DIAGNOSIS — J3089 Other allergic rhinitis: Secondary | ICD-10-CM | POA: Diagnosis not present

## 2020-04-19 DIAGNOSIS — J3081 Allergic rhinitis due to animal (cat) (dog) hair and dander: Secondary | ICD-10-CM | POA: Diagnosis not present

## 2020-04-19 DIAGNOSIS — J3089 Other allergic rhinitis: Secondary | ICD-10-CM | POA: Diagnosis not present

## 2020-04-19 DIAGNOSIS — J301 Allergic rhinitis due to pollen: Secondary | ICD-10-CM | POA: Diagnosis not present

## 2020-04-26 ENCOUNTER — Other Ambulatory Visit: Payer: Self-pay | Admitting: Family Medicine

## 2020-04-27 DIAGNOSIS — D1801 Hemangioma of skin and subcutaneous tissue: Secondary | ICD-10-CM | POA: Diagnosis not present

## 2020-04-27 DIAGNOSIS — L700 Acne vulgaris: Secondary | ICD-10-CM | POA: Diagnosis not present

## 2020-04-27 DIAGNOSIS — L821 Other seborrheic keratosis: Secondary | ICD-10-CM | POA: Diagnosis not present

## 2020-04-27 DIAGNOSIS — L814 Other melanin hyperpigmentation: Secondary | ICD-10-CM | POA: Diagnosis not present

## 2020-04-27 DIAGNOSIS — D225 Melanocytic nevi of trunk: Secondary | ICD-10-CM | POA: Diagnosis not present

## 2020-05-03 DIAGNOSIS — J3081 Allergic rhinitis due to animal (cat) (dog) hair and dander: Secondary | ICD-10-CM | POA: Diagnosis not present

## 2020-05-03 DIAGNOSIS — J301 Allergic rhinitis due to pollen: Secondary | ICD-10-CM | POA: Diagnosis not present

## 2020-05-03 DIAGNOSIS — J3089 Other allergic rhinitis: Secondary | ICD-10-CM | POA: Diagnosis not present

## 2020-05-10 DIAGNOSIS — J3089 Other allergic rhinitis: Secondary | ICD-10-CM | POA: Diagnosis not present

## 2020-05-10 DIAGNOSIS — J3081 Allergic rhinitis due to animal (cat) (dog) hair and dander: Secondary | ICD-10-CM | POA: Diagnosis not present

## 2020-05-10 DIAGNOSIS — J301 Allergic rhinitis due to pollen: Secondary | ICD-10-CM | POA: Diagnosis not present

## 2020-05-16 DIAGNOSIS — J3089 Other allergic rhinitis: Secondary | ICD-10-CM | POA: Diagnosis not present

## 2020-05-16 DIAGNOSIS — J301 Allergic rhinitis due to pollen: Secondary | ICD-10-CM | POA: Diagnosis not present

## 2020-05-16 DIAGNOSIS — J3081 Allergic rhinitis due to animal (cat) (dog) hair and dander: Secondary | ICD-10-CM | POA: Diagnosis not present

## 2020-06-02 DIAGNOSIS — J3089 Other allergic rhinitis: Secondary | ICD-10-CM | POA: Diagnosis not present

## 2020-06-02 DIAGNOSIS — J3081 Allergic rhinitis due to animal (cat) (dog) hair and dander: Secondary | ICD-10-CM | POA: Diagnosis not present

## 2020-06-02 DIAGNOSIS — J301 Allergic rhinitis due to pollen: Secondary | ICD-10-CM | POA: Diagnosis not present

## 2020-06-17 DIAGNOSIS — J3081 Allergic rhinitis due to animal (cat) (dog) hair and dander: Secondary | ICD-10-CM | POA: Diagnosis not present

## 2020-06-17 DIAGNOSIS — J3089 Other allergic rhinitis: Secondary | ICD-10-CM | POA: Diagnosis not present

## 2020-06-17 DIAGNOSIS — J301 Allergic rhinitis due to pollen: Secondary | ICD-10-CM | POA: Diagnosis not present

## 2020-06-29 DIAGNOSIS — J3089 Other allergic rhinitis: Secondary | ICD-10-CM | POA: Diagnosis not present

## 2020-06-29 DIAGNOSIS — J3081 Allergic rhinitis due to animal (cat) (dog) hair and dander: Secondary | ICD-10-CM | POA: Diagnosis not present

## 2020-06-29 DIAGNOSIS — J301 Allergic rhinitis due to pollen: Secondary | ICD-10-CM | POA: Diagnosis not present

## 2020-07-11 DIAGNOSIS — J3089 Other allergic rhinitis: Secondary | ICD-10-CM | POA: Diagnosis not present

## 2020-07-11 DIAGNOSIS — J3081 Allergic rhinitis due to animal (cat) (dog) hair and dander: Secondary | ICD-10-CM | POA: Diagnosis not present

## 2020-07-11 DIAGNOSIS — J301 Allergic rhinitis due to pollen: Secondary | ICD-10-CM | POA: Diagnosis not present

## 2020-07-18 ENCOUNTER — Ambulatory Visit (INDEPENDENT_AMBULATORY_CARE_PROVIDER_SITE_OTHER): Payer: Medicare PPO | Admitting: *Deleted

## 2020-07-18 DIAGNOSIS — Z0000 Encounter for general adult medical examination without abnormal findings: Secondary | ICD-10-CM | POA: Diagnosis not present

## 2020-07-18 NOTE — Progress Notes (Signed)
Subjective:   Marcia Adams is a 65 y.o. female who presents for an Initial Medicare Annual Wellness Visit.  I connected with  Clint Lipps on 07/18/20 by a  telephone enabled telemedicine application and verified that I am speaking with the correct person using two identifiers.   I discussed the limitations of evaluation and management by telemedicine. The patient expressed understanding and agreed to proceed.   Review of Systems    NA       Objective:    There were no vitals filed for this visit. There is no height or weight on file to calculate BMI.  Advanced Directives 07/14/2019 12/06/2016 02/23/2014 12/15/2013 11/22/2013  Does Patient Have a Medical Advance Directive? Yes No;Yes No No No  Type of Paramedic of [place];Living will [place];Living will - - -  Does patient want to make changes to medical advance directive? Yes (MAU/Ambulatory/Procedural Areas - Information given) - - - -  Copy of [place] in Chart? No - copy requested Yes - - -  Would patient like information on creating a medical advance directive? - - No - patient declined information No - patient declined information Yes - Educational materials given    Current Medications (verified) Outpatient Encounter Medications as of 07/18/2020  Medication Sig   Azelastine-Fluticasone 137-50 MCG/ACT SUSP Place 2 sprays into the nose 2 (two) times daily.   azithromycin (ZITHROMAX) 250 MG tablet Take two tablets on day 1, then one daily x 4 days   buPROPion (WELLBUTRIN XL) 150 MG 24 hr tablet TK ONE T PO D UTD   chlordiazePOXIDE (LIBRIUM) 5 MG capsule    clindamycin (CLINDAGEL) 1 % gel Apply topically daily.    EPIPEN 2-PAK 0.3 MG/0.3ML DEVI Inject 0.3 mg into the muscle daily as needed (allergic reaction).    Eszopiclone 3 MG TABS Take 3 mg by mouth at bedtime. Take immediately before bedtime   L-Methylfolate 15 MG TABS    levothyroxine (SYNTHROID) 88  MCG tablet TAKE 1 TABLET(88 MCG) BY MOUTH DAILY   loratadine (CLARITIN) 10 MG tablet Take 10 mg by mouth at bedtime.   montelukast (SINGULAIR) 10 MG tablet TK 1 T PO QD IN THE EVE   simvastatin (ZOCOR) 10 MG tablet Take 1 tablet (10 mg total) by mouth at bedtime.   spironolactone (ALDACTONE) 50 MG tablet Take 50 mg by mouth 2 (two) times daily.    tazarotene (AVAGE) 0.1 % cream Apply topically at bedtime.   valACYclovir (VALTREX) 500 MG tablet    vortioxetine HBr (TRINTELLIX) 20 MG TABS tablet Take 20 mg by mouth daily.   [DISCONTINUED] azelastine (ASTELIN) 137 MCG/SPRAY nasal spray One spray in each nostril twice daily as needed for allergies.    No facility-administered encounter medications on file as of 07/18/2020.    Allergies (verified) Penicillins, Ciprofloxacin, Aripiprazole, Aripiprazole, Escitalopram oxalate, and Enalapril   History: Past Medical History:  Diagnosis Date   Allergic rhinitis    Allergy    Anemia    Anxiety    Depression    Diverticulitis    Esophageal stricture    GERD (gastroesophageal reflux disease)    Heart murmur    mild since birth   Hiatal hernia    Hyperlipidemia    Hypertension    Hypothyroidism    Insomnia    Kidney stones    Menopause    Renal insufficiency    Vitamin D deficiency    Past Surgical  History:  Procedure Laterality Date   FOOT TENDON SURGERY Left    HEMORRHOID SURGERY     MASS EXCISION  06/08/2011   Procedure: MINOR EXCISION OF MASS;  Surgeon: Cammie Sickle., MD;  Location: [facility];  Service: Orthopedics;  Laterality: Right;  excisional biopsy dorsal right hand   NASAL SINUS SURGERY  6/22   NISSEN FUNDOPLICATION  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  Pt not to lye completely flat.   STOMACH SURGERY     UPPER GASTROINTESTINAL ENDOSCOPY     Family History  Problem Relation Age of Onset   COPD Mother    Congestive Heart Failure  Mother    Dementia Mother    COPD Father    Congestive Heart Failure Father    Depression Brother    Urolithiasis Brother    Arthritis Maternal Grandmother    Kidney cancer Maternal Grandfather    Eczema Daughter    Colon cancer Neg Hx    Esophageal cancer Neg Hx    Rectal cancer Neg Hx    Stomach cancer Neg Hx    Social History   Socioeconomic History   Marital status: Divorced    Spouse name: Not on file   Number of children: 2   Years of education: Not on file   Highest education level: Not on file  Occupational History   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[employer]  Tobacco Use   Smoking status: Never   Smokeless tobacco: Never  Vaping Use   Vaping Use: Never used  Substance and Sexual Activity   Alcohol use: Not Currently    Comment: occasionally   Drug use: No   Sexual activity: Never    Birth control/protection: None  Other Topics Concern   Not on file  Social History Narrative   Not on file   Social Determinants of Health   Financial Resource Strain: Not on file  Food Insecurity: Not on file  Transportation Needs: Not on file  Physical Activity: Not on file  Stress: Not on file  Social Connections: Not on file    Tobacco Counseling Counseling given: Not Answered   Clinical Intake:                 Diabetic?  no         Activities of Daily Living No flowsheet data found.  Patient Care Team: Midge Minium, MD as PCP - General (Family Medicine) Lafayette Dragon, MD (Inactive) as Consulting Physician (Gastroenterology) Mosetta Anis, MD as Referring Physician (Allergy) Linda Hedges, DO as Consulting Physician (Obstetrics and Gynecology) Sydnee Levans, MD as Consulting Physician (Dermatology) Chucky May, MD as Consulting Physician (Psychiatry)  Indicate any recent Medical Services you may have received from other than [facility] providers in the past year (date may be approximate).      Assessment:   This is a routine wellness examination for Marcia Adams.  Hearing/Vision screen No results found.  Dietary issues and exercise activities discussed:     Goals Addressed   None    Depression Screen PHQ 2/9 Scores 07/14/2019 04/10/2019 09/11/2017 02/28/2017 12/10/2016 07/30/2016 06/07/2016  PHQ - 2 Score 0 6 0 0 0 0 0  PHQ- 9 Score 0 6 0 0 0 0 0    Fall Risk Fall Risk  07/14/2019 07/14/2019 04/10/2019 04/10/2019 04/29/2018  Falls in the past year? 0  0 0 0 1  Number falls in past yr: 0 0 0 0 0  Injury with Fall? 0 0 0 0 1  Comment - - - - broken toes  Risk for fall due to : - - - - -  Follow up Falls evaluation completed Falls evaluation completed Falls evaluation completed Falls evaluation completed -    FALL RISK PREVENTION PERTAINING TO THE HOME:  Any stairs in or around the home? Yes  If so, are there any without handrails? Yes  Home free of loose throw rugs in walkways, pet beds, electrical cords, etc? Yes  Adequate lighting in your home to reduce risk of falls? Yes   ASSISTIVE DEVICES UTILIZED TO PREVENT FALLS:  Life alert? No  Use of a cane, walker or w/c? No  Grab bars in the bathroom? No  Shower chair or bench in shower? No  Elevated toilet seat or a handicapped toilet? No   TIMED UP AND GO:  Was the test performed? No .      Cognitive Function:  Normal cognitive status assessed by direct observation by this Nurse Health Advisor. No abnormalities found.          Immunizations Immunization History  Administered Date(s) Administered   Influenza, High Dose Seasonal PF 11/16/2014, 01/09/2016, 11/16/2016, 11/12/2018, 12/16/2018, 12/15/2019   Influenza, Seasonal, Injecte, Preservative Fre 01/07/2012   Influenza,inj,Quad PF,6+ Mos 10/27/2015, 11/27/2017   Influenza-Unspecified 10/07/2014, 11/16/2016, 11/01/2019   PFIZER(Purple Top)SARS-COV-2 Vaccination 03/14/2019, 04/04/2019, 06/04/2019, 11/10/2019, 12/15/2019   Pneumococcal Conjugate-13 11/12/2018    Pneumococcal Polysaccharide-23 12/16/2018, 06/04/2019, 12/15/2019   Tdap 05/16/2011   Zoster Recombinat (Shingrix) 06/07/2016, 12/10/2016   Zoster, Live 02/27/2006    TDAP status: Up to date  Flu Vaccine status: Up to date  Pneumococcal vaccine status: Up to date  Covid-19 vaccine status: Completed vaccines  Qualifies for Shingles Vaccine? Yes   Zostavax completed Yes   Shingrix Completed?: Yes  Screening Tests Health Maintenance  Topic Date Due   INFLUENZA VACCINE  09/05/2020   MAMMOGRAM  09/22/2020   TETANUS/TDAP  05/15/2021   COLONOSCOPY (Pts 45-62yrs Insurance coverage will need to be confirmed)  01/05/2024   DEXA SCAN  Completed   COVID-19 Vaccine  Completed   Hepatitis C Screening  Completed   PNA vac Low Risk Adult  Completed   Zoster Vaccines- Shingrix  Completed   HPV VACCINES  Aged Out    Health Maintenance  There are no preventive care reminders to display for this patient.  Colorectal cancer screening: Type of screening: Colonoscopy. Completed 01-05-2019. Repeat every 5 years  Mammogram status: Completed 08-2019. Repeat every year  Bone Density completed   Lung Cancer Screening: (Low Dose CT Chest recommended if Age 12-80 years, 30 pack-year currently smoking OR have quit w/in 15years.) does not qualify.   Lung Cancer Screening Referral: na  Additional Screening:  Hepatitis C Screening: does not qualify; Completed 07-14-2019  Vision Screening: Recommended annual ophthalmology exams for early detection of glaucoma and other disorders of the eye. Is the patient up to date with their annual eye exam?  Yes  Who is the provider or what is the name of the office in which the patient attends annual eye exams? Dr Einar Gip If pt is not established with a provider, would they like to be referred to a provider to establish care?  established .   Dental Screening: Recommended annual dental exams for proper oral hygiene  Community Resource Referral / Chronic  Care Management: CRR required this visit?  No   CCM required this visit?  No      Plan:     I have personally reviewed and noted the following in the patient's chart:   Medical and social history Use of alcohol, tobacco or illicit drugs  Current medications and supplements including opioid prescriptions. Patient is not currently taking opioid prescriptions. Functional ability and status Nutritional status Physical activity Advanced directives List of other physicians Hospitalizations, surgeries, and ER visits in previous 12 months Vitals Screenings to include cognitive, depression, and falls Referrals and appointments  In addition, I have reviewed and discussed with patient certain preventive protocols, quality metrics, and best practice recommendations. A written personalized care plan for preventive services as well as general preventive health recommendations were provided to patient.     Leroy Kennedy, LPN   [id number]/84/[id number]   Nurse Notes: na

## 2020-07-18 NOTE — Progress Notes (Signed)
Subjective:   SHELIE LANSING is a 65 y.o. female who presents for an Initial Medicare Annual Wellness Visit  I connected with  Clint Lipps on 07/18/20 by a  telephone enabled telemedicine application and verified that I am speaking with the correct person using two identifiers.   I discussed the limitations of evaluation and management by telemedicine. The patient expressed understanding and agreed to proceed.   Review of Systems    NA Cardiac Risk Factors include: advanced age (>50men, >58 women);dyslipidemia     Objective:    Today's Vitals   There is no height or weight on file to calculate BMI.  Advanced Directives 07/14/2019 12/06/2016 02/23/2014 12/15/2013 11/22/2013  Does Patient Have a Medical Advance Directive? Yes No;Yes No No No  Type of Paramedic of [place];Living will [place];Living will - - -  Does patient want to make changes to medical advance directive? Yes (MAU/Ambulatory/Procedural Areas - Information given) - - - -  Copy of [place] in Chart? No - copy requested Yes - - -  Would patient like information on creating a medical advance directive? - - No - patient declined information No - patient declined information Yes - Educational materials given    Current Medications (verified) Outpatient Encounter Medications as of 07/18/2020  Medication Sig   Azelastine-Fluticasone 137-50 MCG/ACT SUSP Place 2 sprays into the nose 2 (two) times daily.   buPROPion (WELLBUTRIN XL) 150 MG 24 hr tablet TK ONE T PO D UTD   chlordiazePOXIDE (LIBRIUM) 5 MG capsule    clindamycin (CLINDAGEL) 1 % gel Apply topically daily.    EPIPEN 2-PAK 0.3 MG/0.3ML DEVI Inject 0.3 mg into the muscle daily as needed (allergic reaction).    Eszopiclone 3 MG TABS Take 3 mg by mouth at bedtime. Take immediately before bedtime   L-Methylfolate 15 MG TABS    levothyroxine (SYNTHROID) 88 MCG tablet TAKE 1 TABLET(88 MCG) BY MOUTH  DAILY   loratadine (CLARITIN) 10 MG tablet Take 10 mg by mouth at bedtime.   montelukast (SINGULAIR) 10 MG tablet TK 1 T PO QD IN THE EVE   simvastatin (ZOCOR) 10 MG tablet Take 1 tablet (10 mg total) by mouth at bedtime.   spironolactone (ALDACTONE) 50 MG tablet Take 50 mg by mouth 2 (two) times daily.    tazarotene (AVAGE) 0.1 % cream Apply topically at bedtime.   valACYclovir (VALTREX) 500 MG tablet    vortioxetine HBr (TRINTELLIX) 20 MG TABS tablet Take 20 mg by mouth daily.   azithromycin (ZITHROMAX) 250 MG tablet Take two tablets on day 1, then one daily x 4 days   [DISCONTINUED] azelastine (ASTELIN) 137 MCG/SPRAY nasal spray One spray in each nostril twice daily as needed for allergies.    No facility-administered encounter medications on file as of 07/18/2020.    Allergies (verified) Penicillins, Ciprofloxacin, Aripiprazole, Aripiprazole, Escitalopram oxalate, and Enalapril   History: Past Medical History:  Diagnosis Date   Allergic rhinitis    Allergy    Anemia    Anxiety    Depression    Diverticulitis    Esophageal stricture    GERD (gastroesophageal reflux disease)    Heart murmur    mild since birth   Hiatal hernia    Hyperlipidemia    Hypertension    Hypothyroidism    Insomnia    Kidney stones    Menopause    Renal insufficiency    Vitamin D deficiency  Past Surgical History:  Procedure Laterality Date   FOOT TENDON SURGERY Left    HEMORRHOID SURGERY     MASS EXCISION  06/08/2011   Procedure: MINOR EXCISION OF MASS;  Surgeon: Cammie Sickle., MD;  Location: [place];  Service: Orthopedics;  Laterality: Right;  excisional biopsy dorsal right hand   NASAL SINUS SURGERY  8/09   NISSEN FUNDOPLICATION  9/83   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  Pt not to lye completely flat.   STOMACH SURGERY     UPPER GASTROINTESTINAL ENDOSCOPY     Family History  Problem Relation Age of  Onset   COPD Mother    Congestive Heart Failure Mother    Dementia Mother    COPD Father    Congestive Heart Failure Father    Depression Brother    Urolithiasis Brother    Arthritis Maternal Grandmother    Kidney cancer Maternal Grandfather    Eczema Daughter    Colon cancer Neg Hx    Esophageal cancer Neg Hx    Rectal cancer Neg Hx    Stomach cancer Neg Hx    Social History   Socioeconomic History   Marital status: Divorced    Spouse name: Not on file   Number of children: 2   Years of education: Not on file   Highest education level: Not on file  Occupational History   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[employer]  Tobacco Use   Smoking status: Never   Smokeless tobacco: Never  Vaping Use   Vaping Use: Never used  Substance and Sexual Activity   Alcohol use: Not Currently    Comment: occasionally   Drug use: No   Sexual activity: Never    Birth control/protection: None  Other Topics Concern   Not on file  Social History Narrative   Not on file   Social Determinants of Health   Financial Resource Strain: Low Risk    Difficulty of Paying Living Expenses: Not hard at all  Food Insecurity: No Food Insecurity   Worried About Charity fundraiser in the Last Year: Never true   [place] in the Last Year: Never true  Transportation Needs: No Transportation Needs   Lack of Transportation (Medical): No   Lack of Transportation (Non-Medical): No  Physical Activity: Insufficiently Active   Days of Exercise per Week: 5 days   Minutes of Exercise per Session: 20 min  Stress: No Stress Concern Present   Feeling of Stress : Not at all  Social Connections: Socially Isolated   Frequency of Communication with Friends and Family: More than three times a week   Frequency of Social Gatherings with Friends and Family: Twice a week   Attends Religious Services: Never   Marine scientist or Organizations: No   Attends Arts administrator: Never   Marital Status: Divorced    Tobacco Counseling Counseling given: Not Answered   Clinical Intake:  Pre-visit preparation completed: Yes  Pain : No/denies pain     Nutritional Risks: None Diabetes: No  How often do you need to have someone help you when you read instructions, pamphlets, or other written materials from your doctor or pharmacy?: 1 - Never  Diabetic?  no  Interpreter Needed?: No  Information entered by :: Leroy Kennedy LPN   Activities of Daily Living In your  present state of health, do you have any difficulty performing the following activities: 07/18/2020 07/18/2020  Hearing? N N  Vision? N N  Difficulty concentrating or making decisions? N N  Walking or climbing stairs? N N  Dressing or bathing? N N  Doing errands, shopping? N N  Preparing Food and eating ? N N  Using the Toilet? N N  In the past six months, have you accidently leaked urine? N N  Do you have problems with loss of bowel control? N N  Managing your Medications? N N  Managing your Finances? N N  Housekeeping or managing your Housekeeping? - N  Some recent data might be hidden    Patient Care Team: Midge Minium, MD as PCP - General (Family Medicine) Lafayette Dragon, MD (Inactive) as Consulting Physician (Gastroenterology) Mosetta Anis, MD as Referring Physician (Allergy) Linda Hedges, DO as Consulting Physician (Obstetrics and Gynecology) Sydnee Levans, MD as Consulting Physician (Dermatology) Chucky May, MD as Consulting Physician (Psychiatry)  Indicate any recent Medical Services you may have received from other than [facility] providers in the past year (date may be approximate).     Assessment:   This is a routine wellness examination for Mischell.  Hearing/Vision screen Hearing Screening - Comments:: No trouble hearing Vision Screening - Comments:: Dr Einar Gip   Up to date  Dietary issues and exercise activities discussed: Current Exercise  Habits: Home exercise routine, Type of exercise: walking, Time (Minutes): 25, Frequency (Times/Week): 5, Weekly Exercise (Minutes/Week): 125, Intensity: Mild   Goals Addressed             This Visit's Progress    Patient Stated       Patient would like to start exercise bands to increase to strength        Depression Screen PHQ 2/9 Scores 07/18/2020 07/14/2019 04/10/2019 09/11/2017 02/28/2017 12/10/2016 07/30/2016  PHQ - 2 Score 0 0 6 0 0 0 0  PHQ- 9 Score - 0 6 0 0 0 0    Fall Risk Fall Risk  07/18/2020 07/14/2019 07/14/2019 04/10/2019 04/10/2019  Falls in the past year? 0 0 0 0 0  Number falls in past yr: 0 0 0 0 0  Injury with Fall? 0 0 0 0 0  Comment - - - - -  Risk for fall due to : - - - - -  Follow up Falls evaluation completed;Falls prevention discussed Falls evaluation completed Falls evaluation completed Falls evaluation completed Falls evaluation completed    FALL RISK PREVENTION PERTAINING TO THE HOME:  Any stairs in or around the home? Yes  If so, are there any without handrails? Yes  Home free of loose throw rugs in walkways, pet beds, electrical cords, etc? Yes  Adequate lighting in your home to reduce risk of falls? Yes   ASSISTIVE DEVICES UTILIZED TO PREVENT FALLS:  Life alert? No  Use of a cane, walker or w/c? No  Grab bars in the bathroom? No  Shower chair or bench in shower? No  Elevated toilet seat or a handicapped toilet? No   TIMED UP AND GO:  Was the test performed? No .      Cognitive Function:  Normal cognitive status assessed by direct observation by this Nurse Health Advisor. No abnormalities found.          Immunizations Immunization History  Administered Date(s) Administered   Influenza, High Dose Seasonal PF 11/16/2014, 01/09/2016, 11/16/2016, 11/12/2018, 12/16/2018, 12/15/2019   Influenza, Seasonal, Injecte, Preservative Fre 01/07/2012  Influenza,inj,Quad PF,6+ Mos 10/27/2015, 11/27/2017   Influenza-Unspecified 10/07/2014, 11/16/2016,  11/01/2019   PFIZER(Purple Top)SARS-COV-2 Vaccination 03/14/2019, 04/04/2019, 06/04/2019, 11/10/2019, 12/15/2019   Pneumococcal Conjugate-13 11/12/2018   Pneumococcal Polysaccharide-23 12/16/2018, 06/04/2019, 12/15/2019   Tdap 05/16/2011   Zoster Recombinat (Shingrix) 06/07/2016, 12/10/2016   Zoster, Live 02/27/2006    TDAP status: Up to date  Flu Vaccine status: Up to date  Pneumococcal vaccine status: Up to date  Covid-19 vaccine status: Completed vaccines  Qualifies for Shingles Vaccine? No   Zostavax completed Yes   Shingrix Completed?: Yes  Screening Tests Health Maintenance  Topic Date Due   INFLUENZA VACCINE  09/05/2020   MAMMOGRAM  09/22/2020   TETANUS/TDAP  05/15/2021   COLONOSCOPY (Pts 45-37yrs Insurance coverage will need to be confirmed)  01/05/2024   DEXA SCAN  Completed   COVID-19 Vaccine  Completed   Hepatitis C Screening  Completed   PNA vac Low Risk Adult  Completed   Zoster Vaccines- Shingrix  Completed   HPV VACCINES  Aged Out    Health Maintenance  There are no preventive care reminders to display for this patient.  Colorectal cancer screening: Type of screening: Colonoscopy. Completed 01/05/2019. Repeat every 5 years  Mammogram status: Completed  . Repeat every year  Bone Density status: Completed 05-24-2017. Results reflect: Bone density results: NORMAL. Repeat every 5 years.  Lung Cancer Screening: (Low Dose CT Chest recommended if Age 41-80 years, 30 pack-year currently smoking OR have quit w/in 15years.) does not qualify.   Lung Cancer Screening Referral: na  Additional Screening:  Hepatitis C Screening: does not qualify; Completed   Vision Screening: Recommended annual ophthalmology exams for early detection of glaucoma and other disorders of the eye. Is the patient up to date with their annual eye exam?  Yes  Who is the provider or what is the name of the office in which the patient attends annual eye exams? Dr. Einar Gip If pt is not  established with a provider, would they like to be referred to a provider to establish care? No .   Dental Screening: Recommended annual dental exams for proper oral hygiene  Community Resource Referral / Chronic Care Management: CRR required this visit?  No   CCM required this visit?  No      Plan:     I have personally reviewed and noted the following in the patient's chart:   Medical and social history Use of alcohol, tobacco or illicit drugs  Current medications and supplements including opioid prescriptions. Patient is currently taking opioid prescriptions. Information provided to patient regarding non-opioid alternatives. Patient advised to discuss non-opioid treatment plan with their provider. Functional ability and status Nutritional status Physical activity Advanced directives List of other physicians Hospitalizations, surgeries, and ER visits in previous 12 months Vitals Screenings to include cognitive, depression, and falls Referrals and appointments  In addition, I have reviewed and discussed with patient certain preventive protocols, quality metrics, and best practice recommendations. A written personalized care plan for preventive services as well as general preventive health recommendations were provided to patient.     Leroy Kennedy, LPN   03/21/863   Nurse Notes: NA       Subjective:   Clint Lipps is a 65 y.o. female who presents for an Initial Medicare Annual Wellness Visit.  Review of Systems      NA Cardiac Risk Factors include: advanced age (>37men, >75 women);dyslipidemia     Objective:    Today's Vitals   There is no height  or weight on file to calculate BMI.  Advanced Directives 07/14/2019 12/06/2016 02/23/2014 12/15/2013 11/22/2013  Does Patient Have a Medical Advance Directive? Yes No;Yes No No No  Type of Paramedic of [place];Living will [place];Living will - - -  Does patient want to  make changes to medical advance directive? Yes (MAU/Ambulatory/Procedural Areas - Information given) - - - -  Copy of [place] in Chart? No - copy requested Yes - - -  Would patient like information on creating a medical advance directive? - - No - patient declined information No - patient declined information Yes - Educational materials given    Current Medications (verified) Outpatient Encounter Medications as of 07/18/2020  Medication Sig   Azelastine-Fluticasone 137-50 MCG/ACT SUSP Place 2 sprays into the nose 2 (two) times daily.   buPROPion (WELLBUTRIN XL) 150 MG 24 hr tablet TK ONE T PO D UTD   chlordiazePOXIDE (LIBRIUM) 5 MG capsule    clindamycin (CLINDAGEL) 1 % gel Apply topically daily.    EPIPEN 2-PAK 0.3 MG/0.3ML DEVI Inject 0.3 mg into the muscle daily as needed (allergic reaction).    Eszopiclone 3 MG TABS Take 3 mg by mouth at bedtime. Take immediately before bedtime   L-Methylfolate 15 MG TABS    levothyroxine (SYNTHROID) 88 MCG tablet TAKE 1 TABLET(88 MCG) BY MOUTH DAILY   loratadine (CLARITIN) 10 MG tablet Take 10 mg by mouth at bedtime.   montelukast (SINGULAIR) 10 MG tablet TK 1 T PO QD IN THE EVE   simvastatin (ZOCOR) 10 MG tablet Take 1 tablet (10 mg total) by mouth at bedtime.   spironolactone (ALDACTONE) 50 MG tablet Take 50 mg by mouth 2 (two) times daily.    tazarotene (AVAGE) 0.1 % cream Apply topically at bedtime.   valACYclovir (VALTREX) 500 MG tablet    vortioxetine HBr (TRINTELLIX) 20 MG TABS tablet Take 20 mg by mouth daily.   azithromycin (ZITHROMAX) 250 MG tablet Take two tablets on day 1, then one daily x 4 days   [DISCONTINUED] azelastine (ASTELIN) 137 MCG/SPRAY nasal spray One spray in each nostril twice daily as needed for allergies.    No facility-administered encounter medications on file as of 07/18/2020.    Allergies (verified) Penicillins, Ciprofloxacin, Aripiprazole, Aripiprazole, Escitalopram oxalate, and Enalapril    History: Past Medical History:  Diagnosis Date   Allergic rhinitis    Allergy    Anemia    Anxiety    Depression    Diverticulitis    Esophageal stricture    GERD (gastroesophageal reflux disease)    Heart murmur    mild since birth   Hiatal hernia    Hyperlipidemia    Hypertension    Hypothyroidism    Insomnia    Kidney stones    Menopause    Renal insufficiency    Vitamin D deficiency    Past Surgical History:  Procedure Laterality Date   FOOT TENDON SURGERY Left    HEMORRHOID SURGERY     MASS EXCISION  06/08/2011   Procedure: MINOR EXCISION OF MASS;  Surgeon: Cammie Sickle., MD;  Location: [place];  Service: Orthopedics;  Laterality: Right;  excisional biopsy dorsal right hand   NASAL SINUS SURGERY  8/18   NISSEN FUNDOPLICATION  5/63   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  Pt not to lye completely flat.   STOMACH  SURGERY     UPPER GASTROINTESTINAL ENDOSCOPY     Family History  Problem Relation Age of Onset   COPD Mother    Congestive Heart Failure Mother    Dementia Mother    COPD Father    Congestive Heart Failure Father    Depression Brother    Urolithiasis Brother    Arthritis Maternal Grandmother    Kidney cancer Maternal Grandfather    Eczema Daughter    Colon cancer Neg Hx    Esophageal cancer Neg Hx    Rectal cancer Neg Hx    Stomach cancer Neg Hx    Social History   Socioeconomic History   Marital status: Divorced    Spouse name: Not on file   Number of children: 2   Years of education: Not on file   Highest education level: Not on file  Occupational History   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[employer]  Tobacco Use   Smoking status: Never   Smokeless tobacco: Never  Vaping Use   Vaping Use: Never used  Substance and Sexual Activity   Alcohol use: Not Currently    Comment: occasionally   Drug use: No   Sexual activity:  Never    Birth control/protection: None  Other Topics Concern   Not on file  Social History Narrative   Not on file   Social Determinants of Health   Financial Resource Strain: Low Risk    Difficulty of Paying Living Expenses: Not hard at all  Food Insecurity: No Food Insecurity   Worried About Charity fundraiser in the Last Year: Never true   [place] in the Last Year: Never true  Transportation Needs: No Transportation Needs   Lack of Transportation (Medical): No   Lack of Transportation (Non-Medical): No  Physical Activity: Insufficiently Active   Days of Exercise per Week: 5 days   Minutes of Exercise per Session: 20 min  Stress: No Stress Concern Present   Feeling of Stress : Not at all  Social Connections: Socially Isolated   Frequency of Communication with Friends and Family: More than three times a week   Frequency of Social Gatherings with Friends and Family: Twice a week   Attends Religious Services: Never   Marine scientist or Organizations: No   Attends Music therapist: Never   Marital Status: Divorced    Tobacco Counseling Counseling given: Not Answered   Clinical Intake:  Pre-visit preparation completed: Yes  Pain : No/denies pain     Nutritional Risks: None Diabetes: No  How often do you need to have someone help you when you read instructions, pamphlets, or other written materials from your doctor or pharmacy?: 1 - Never  Diabetic?NO  Interpreter Needed?: No  Information entered by :: Leroy Kennedy LPN   Activities of Daily Living In your present state of health, do you have any difficulty performing the following activities: 07/18/2020 07/18/2020  Hearing? N N  Vision? N N  Difficulty concentrating or making decisions? N N  Walking or climbing stairs? N N  Dressing or bathing? N N  Doing errands, shopping? N N  Preparing Food and eating ? N N  Using the Toilet? N N  In the past six months, have you accidently  leaked urine? N N  Do you have problems with loss of bowel control? N N  Managing your Medications? N N  Managing your Finances? N N  Housekeeping or managing  your Housekeeping? - N  Some recent data might be hidden    Patient Care Team: Midge Minium, MD as PCP - General (Family Medicine) Lafayette Dragon, MD (Inactive) as Consulting Physician (Gastroenterology) Mosetta Anis, MD as Referring Physician (Allergy) Linda Hedges, DO as Consulting Physician (Obstetrics and Gynecology) Sydnee Levans, MD as Consulting Physician (Dermatology) Chucky May, MD as Consulting Physician (Psychiatry)  Indicate any recent Medical Services you may have received from other than [facility] providers in the past year (date may be approximate).     Assessment:   This is a routine wellness examination for Evelin.  Hearing/Vision screen Hearing Screening - Comments:: No trouble hearing Vision Screening - Comments:: Dr Einar Gip   Up to date  Dietary issues and exercise activities discussed: Current Exercise Habits: Home exercise routine, Type of exercise: walking, Time (Minutes): 25, Frequency (Times/Week): 5, Weekly Exercise (Minutes/Week): 125, Intensity: Mild   Goals Addressed             This Visit's Progress    Patient Stated       Patient would like to start exercise bands to increase to strength       Depression Screen PHQ 2/9 Scores 07/18/2020 07/14/2019 04/10/2019 09/11/2017 02/28/2017 12/10/2016 07/30/2016  PHQ - 2 Score 0 0 6 0 0 0 0  PHQ- 9 Score - 0 6 0 0 0 0    Fall Risk Fall Risk  07/18/2020 07/14/2019 07/14/2019 04/10/2019 04/10/2019  Falls in the past year? 0 0 0 0 0  Number falls in past yr: 0 0 0 0 0  Injury with Fall? 0 0 0 0 0  Comment - - - - -  Risk for fall due to : - - - - -  Follow up Falls evaluation completed;Falls prevention discussed Falls evaluation completed Falls evaluation completed Falls evaluation completed Falls evaluation completed    FALL RISK  PREVENTION PERTAINING TO THE HOME:  Any stairs in or around the home? Yes  If so, are there any without handrails? Yes  Home free of loose throw rugs in walkways, pet beds, electrical cords, etc? Yes  Adequate lighting in your home to reduce risk of falls? Yes   ASSISTIVE DEVICES UTILIZED TO PREVENT FALLS:  Life alert? No  Use of a cane, walker or w/c? No  Grab bars in the bathroom? No  Shower chair or bench in shower? No  Elevated toilet seat or a handicapped toilet? No   TIMED UP AND GO:  Was the test performed? No .   Cognitive Function:  Normal cognitive status assessed by direct observation by this Nurse Health Advisor. No abnormalities found.          Immunizations Immunization History  Administered Date(s) Administered   Influenza, High Dose Seasonal PF 11/16/2014, 01/09/2016, 11/16/2016, 11/12/2018, 12/16/2018, 12/15/2019   Influenza, Seasonal, Injecte, Preservative Fre 01/07/2012   Influenza,inj,Quad PF,6+ Mos 10/27/2015, 11/27/2017   Influenza-Unspecified 10/07/2014, 11/16/2016, 11/01/2019   PFIZER(Purple Top)SARS-COV-2 Vaccination 03/14/2019, 04/04/2019, 06/04/2019, 11/10/2019, 12/15/2019   Pneumococcal Conjugate-13 11/12/2018   Pneumococcal Polysaccharide-23 12/16/2018, 06/04/2019, 12/15/2019   Tdap 05/16/2011   Zoster Recombinat (Shingrix) 06/07/2016, 12/10/2016   Zoster, Live 02/27/2006    TDAP status: Up to date  Flu Vaccine status: Up to date  Pneumococcal vaccine status: Up to date  Covid-19 vaccine status: Completed vaccines  Qualifies for Shingles Vaccine? Yes   Zostavax completed Yes   Shingrix Completed?: Yes  Screening Tests Health Maintenance  Topic Date Due   INFLUENZA VACCINE  09/05/2020  MAMMOGRAM  09/22/2020   TETANUS/TDAP  05/15/2021   COLONOSCOPY (Pts 45-54yrs Insurance coverage will need to be confirmed)  01/05/2024   DEXA SCAN  Completed   COVID-19 Vaccine  Completed   Hepatitis C Screening  Completed   PNA vac Low Risk  Adult  Completed   Zoster Vaccines- Shingrix  Completed   HPV VACCINES  Aged Out    Health Maintenance  There are no preventive care reminders to display for this patient.  Colorectal cancer screening: Type of screening: Colonoscopy. Completed 01/05/2019. Repeat every 5 years  Mammogram status: Completed 08-28-2019. Repeat every year  Bone Density status: Completed 2. Results reflect: Bone density results: NORMAL. Repeat every 5 years.  Lung Cancer Screening: (Low Dose CT Chest recommended if Age 49-80 years, 30 pack-year currently smoking OR have quit w/in 15years.) does not qualify.   Lung Cancer Screening Referral:  NA  Additional Screening:  Hepatitis C Screening: does not qualify; Completed 07-14-2019   Vision Screening: Recommended annual ophthalmology exams for early detection of glaucoma and other disorders of the eye. Is the patient up to date with their annual eye exam?  Yes  Who is the provider or what is the name of the office in which the patient attends annual eye exams? Dr. Einar Gip If pt is not established with a provider, would they like to be referred to a provider to establish care? No .   Dental Screening: Recommended annual dental exams for proper oral hygiene  Community Resource Referral / Chronic Care Management: CRR required this visit?  No   CCM required this visit?  No      Plan:     I have personally reviewed and noted the following in the patient's chart:   Medical and social history Use of alcohol, tobacco or illicit drugs  Current medications and supplements including opioid prescriptions. Patient is currently taking opioid prescriptions. Information provided to patient regarding non-opioid alternatives. Patient advised to discuss non-opioid treatment plan with their provider. Functional ability and status Nutritional status Physical activity Advanced directives List of other physicians Hospitalizations, surgeries, and ER visits in  previous 12 months Vitals Screenings to include cognitive, depression, and falls Referrals and appointments  In addition, I have reviewed and discussed with patient certain preventive protocols, quality metrics, and best practice recommendations. A written personalized care plan for preventive services as well as general preventive health recommendations were provided to patient.     Leroy Kennedy, LPN   [id number]   Nurse Notes:  NA

## 2020-07-18 NOTE — Patient Instructions (Signed)
Marcia Adams , Thank you for taking time to come for your Medicare Wellness Visit. I appreciate your ongoing commitment to your health goals. Please review the following plan we discussed and let me know if I can assist you in the future.   Screening recommendations/referrals: Colonoscopy: up to date Mammogram: up to date Bone Density: up to date Recommended yearly ophthalmology/optometry visit for glaucoma screening and checkup Recommended yearly dental visit for hygiene and checkup  Vaccinations: Influenza vaccine: up to date Pneumococcal vaccine: up to date Tdap vaccine: up to date Shingles vaccine: up to date    Advanced directives: copy requested  Conditions/risks identified: na  Next appointment: 08-04-2020 @ 9:00 am   Preventive Care 15 Years and Older, Female Preventive care refers to lifestyle choices and visits with your health care provider that can promote health and wellness. What does preventive care include? A yearly physical exam. This is also called an annual well check. Dental exams once or twice a year. Routine eye exams. Ask your health care provider how often you should have your eyes checked. Personal lifestyle choices, including: Daily care of your teeth and gums. Regular physical activity. Eating a healthy diet. Avoiding tobacco and drug use. Limiting alcohol use. Practicing safe sex. Taking low-dose aspirin every day. Taking vitamin and mineral supplements as recommended by your health care provider. What happens during an annual well check? The services and screenings done by your health care provider during your annual well check will depend on your age, overall health, lifestyle risk factors, and family history of disease. Counseling  Your health care provider may ask you questions about your: Alcohol use. Tobacco use. Drug use. Emotional well-being. Home and relationship well-being. Sexual activity. Eating habits. History of falls. Memory  and ability to understand (cognition). Work and work Statistician. Reproductive health. Screening  You may have the following tests or measurements: Height, weight, and BMI. Blood pressure. Lipid and cholesterol levels. These may be checked every 5 years, or more frequently if you are over 79 years old. Skin check. Lung cancer screening. You may have this screening every year starting at age 76 if you have a 30-pack-year history of smoking and currently smoke or have quit within the past 15 years. Fecal occult blood test (FOBT) of the stool. You may have this test every year starting at age 54. Flexible sigmoidoscopy or colonoscopy. You may have a sigmoidoscopy every 5 years or a colonoscopy every 10 years starting at age 81. Hepatitis C blood test. Hepatitis B blood test. Sexually transmitted disease (STD) testing. Diabetes screening. This is done by checking your blood sugar (glucose) after you have not eaten for a while (fasting). You may have this done every 1-3 years. Bone density scan. This is done to screen for osteoporosis. You may have this done starting at age 61. Mammogram. This may be done every 1-2 years. Talk to your health care provider about how often you should have regular mammograms. Talk with your health care provider about your test results, treatment options, and if necessary, the need for more tests. Vaccines  Your health care provider may recommend certain vaccines, such as: Influenza vaccine. This is recommended every year. Tetanus, diphtheria, and acellular pertussis (Tdap, Td) vaccine. You may need a Td booster every 10 years. Zoster vaccine. You may need this after age 75. Pneumococcal 13-valent conjugate (PCV13) vaccine. One dose is recommended after age 26. Pneumococcal polysaccharide (PPSV23) vaccine. One dose is recommended after age 27. Talk to your health care  provider about which screenings and vaccines you need and how often you need them. This  information is not intended to replace advice given to you by your health care provider. Make sure you discuss any questions you have with your health care provider. Document Released: 02/18/2015 Document Revised: 10/12/2015 Document Reviewed: 11/23/2014 Elsevier Interactive Patient Education  2017 [place] Prevention in the Home Falls can cause injuries. They can happen to people of all ages. There are many things you can do to make your home safe and to help prevent falls. What can I do on the outside of my home? Regularly fix the edges of walkways and driveways and fix any cracks. Remove anything that might make you trip as you walk through a door, such as a raised step or threshold. Trim any bushes or trees on the path to your home. Use bright outdoor lighting. Clear any walking paths of anything that might make someone trip, such as rocks or tools. Regularly check to see if handrails are loose or broken. Make sure that both sides of any steps have handrails. Any raised decks and porches should have guardrails on the edges. Have any leaves, snow, or ice cleared regularly. Use sand or salt on walking paths during winter. Clean up any spills in your garage right away. This includes oil or grease spills. What can I do in the bathroom? Use night lights. Install grab bars by the toilet and in the tub and shower. Do not use towel bars as grab bars. Use non-skid mats or decals in the tub or shower. If you need to sit down in the shower, use a plastic, non-slip stool. Keep the floor dry. Clean up any water that spills on the floor as soon as it happens. Remove soap buildup in the tub or shower regularly. Attach bath mats securely with double-sided non-slip rug tape. Do not have throw rugs and other things on the floor that can make you trip. What can I do in the bedroom? Use night lights. Make sure that you have a light by your bed that is easy to reach. Do not use any sheets or  blankets that are too big for your bed. They should not hang down onto the floor. Have a firm chair that has side arms. You can use this for support while you get dressed. Do not have throw rugs and other things on the floor that can make you trip. What can I do in the kitchen? Clean up any spills right away. Avoid walking on wet floors. Keep items that you use a lot in easy-to-reach places. If you need to reach something above you, use a strong step stool that has a grab bar. Keep electrical cords out of the way. Do not use floor polish or wax that makes floors slippery. If you must use wax, use non-skid floor wax. Do not have throw rugs and other things on the floor that can make you trip. What can I do with my stairs? Do not leave any items on the stairs. Make sure that there are handrails on both sides of the stairs and use them. Fix handrails that are broken or loose. Make sure that handrails are as long as the stairways. Check any carpeting to make sure that it is firmly attached to the stairs. Fix any carpet that is loose or worn. Avoid having throw rugs at the top or bottom of the stairs. If you do have throw rugs, attach them to the  floor with carpet tape. Make sure that you have a light switch at the top of the stairs and the bottom of the stairs. If you do not have them, ask someone to add them for you. What else can I do to help prevent falls? Wear shoes that: Do not have high heels. Have rubber bottoms. Are comfortable and fit you well. Are closed at the toe. Do not wear sandals. If you use a stepladder: Make sure that it is fully opened. Do not climb a closed stepladder. Make sure that both sides of the stepladder are locked into place. Ask someone to hold it for you, if possible. Clearly mark and make sure that you can see: Any grab bars or handrails. First and last steps. Where the edge of each step is. Use tools that help you move around (mobility aids) if they are  needed. These include: Canes. Walkers. Scooters. Crutches. Turn on the lights when you go into a dark area. Replace any light bulbs as soon as they burn out. Set up your furniture so you have a clear path. Avoid moving your furniture around. If any of your floors are uneven, fix them. If there are any pets around you, be aware of where they are. Review your medicines with your doctor. Some medicines can make you feel dizzy. This can increase your chance of falling. Ask your doctor what other things that you can do to help prevent falls. This information is not intended to replace advice given to you by your health care provider. Make sure you discuss any questions you have with your health care provider. Document Released: 11/18/2008 Document Revised: 06/30/2015 Document Reviewed: 02/26/2014 Elsevier Interactive Patient Education  2017 [employer].

## 2020-07-22 ENCOUNTER — Other Ambulatory Visit: Payer: Self-pay | Admitting: Family Medicine

## 2020-07-26 DIAGNOSIS — J3081 Allergic rhinitis due to animal (cat) (dog) hair and dander: Secondary | ICD-10-CM | POA: Diagnosis not present

## 2020-07-26 DIAGNOSIS — J3089 Other allergic rhinitis: Secondary | ICD-10-CM | POA: Diagnosis not present

## 2020-07-26 DIAGNOSIS — J301 Allergic rhinitis due to pollen: Secondary | ICD-10-CM | POA: Diagnosis not present

## 2020-08-03 ENCOUNTER — Encounter: Payer: Self-pay | Admitting: *Deleted

## 2020-08-04 ENCOUNTER — Other Ambulatory Visit: Payer: Self-pay

## 2020-08-04 ENCOUNTER — Encounter: Payer: Self-pay | Admitting: Family Medicine

## 2020-08-04 ENCOUNTER — Ambulatory Visit (INDEPENDENT_AMBULATORY_CARE_PROVIDER_SITE_OTHER): Payer: Medicare PPO | Admitting: Family Medicine

## 2020-08-04 VITALS — BP 122/80 | HR 60 | Temp 97.5°F | Resp 20 | Ht 65.0 in | Wt 196.8 lb

## 2020-08-04 DIAGNOSIS — Z0000 Encounter for general adult medical examination without abnormal findings: Secondary | ICD-10-CM

## 2020-08-04 DIAGNOSIS — I10 Essential (primary) hypertension: Secondary | ICD-10-CM

## 2020-08-04 LAB — CBC WITH DIFFERENTIAL/PLATELET
Basophils Absolute: 0.1 K/uL (ref 0.0–0.1)
Basophils Relative: 1.2 % (ref 0.0–3.0)
Eosinophils Absolute: 0.2 K/uL (ref 0.0–0.7)
Eosinophils Relative: 2.4 % (ref 0.0–5.0)
HCT: 41.7 % (ref 36.0–46.0)
Hemoglobin: 14.1 g/dL (ref 12.0–15.0)
Lymphocytes Relative: 32.8 % (ref 12.0–46.0)
Lymphs Abs: 2.5 K/uL (ref 0.7–4.0)
MCHC: 33.9 g/dL (ref 30.0–36.0)
MCV: 101.7 fl — ABNORMAL HIGH (ref 78.0–100.0)
Monocytes Absolute: 0.8 K/uL (ref 0.1–1.0)
Monocytes Relative: 10.8 % (ref 3.0–12.0)
Neutro Abs: 4 K/uL (ref 1.4–7.7)
Neutrophils Relative %: 52.8 % (ref 43.0–77.0)
Platelets: 279 K/uL (ref 150.0–400.0)
RBC: 4.1 Mil/uL (ref 3.87–5.11)
RDW: 12.9 % (ref 11.5–15.5)
WBC: 7.5 K/uL (ref 4.0–10.5)

## 2020-08-04 LAB — BASIC METABOLIC PANEL WITH GFR
BUN: 26 mg/dL — ABNORMAL HIGH (ref 6–23)
CO2: 22 meq/L (ref 19–32)
Calcium: 9.5 mg/dL (ref 8.4–10.5)
Chloride: 102 meq/L (ref 96–112)
Creatinine, Ser: 1.33 mg/dL — ABNORMAL HIGH (ref 0.40–1.20)
GFR: 41.61 mL/min — ABNORMAL LOW
Glucose, Bld: 89 mg/dL (ref 70–99)
Potassium: 4.3 meq/L (ref 3.5–5.1)
Sodium: 136 meq/L (ref 135–145)

## 2020-08-04 LAB — LIPID PANEL
Cholesterol: 197 mg/dL (ref 0–200)
HDL: 67 mg/dL
LDL Cholesterol: 107 mg/dL — ABNORMAL HIGH (ref 0–99)
NonHDL: 130.15
Total CHOL/HDL Ratio: 3
Triglycerides: 115 mg/dL (ref 0.0–149.0)
VLDL: 23 mg/dL (ref 0.0–40.0)

## 2020-08-04 LAB — HEPATIC FUNCTION PANEL
ALT: 13 U/L (ref 0–35)
AST: 21 U/L (ref 0–37)
Albumin: 4.3 g/dL (ref 3.5–5.2)
Alkaline Phosphatase: 122 U/L — ABNORMAL HIGH (ref 39–117)
Bilirubin, Direct: 0.1 mg/dL (ref 0.0–0.3)
Total Bilirubin: 0.4 mg/dL (ref 0.2–1.2)
Total Protein: 6.8 g/dL (ref 6.0–8.3)

## 2020-08-04 LAB — TSH: TSH: 0.99 u[IU]/mL (ref 0.35–5.50)

## 2020-08-04 NOTE — Progress Notes (Signed)
   Subjective:    Patient ID: Marcia Adams, female    DOB: 05/24/1955, 65 y.o.   MRN: [id number]  HPI CPE- UTD on pap, mammo, colonoscopy.  UTD on pneumonia vaccines, Tdap, flu, COVID  Reviewed past medical, surgical, family and social histories.   Patient Care Team    Relationship Specialty Notifications Start End  Midge Minium, MD PCP - General Family Medicine  10/01/14   Lafayette Dragon, MD (Inactive) Consulting Physician Gastroenterology  09/29/14   Mosetta Anis, MD Referring Physician Allergy  09/29/14   Linda Hedges, [place] Physician Obstetrics and Gynecology  09/29/14   Sydnee Levans, MD Consulting Physician Dermatology  09/29/14   Chucky May, MD Consulting Physician Psychiatry  09/29/14     Health Maintenance  Topic Date Due   COVID-19 Vaccine (4 - Booster for [place] series) 03/12/2020   INFLUENZA VACCINE  09/05/2020   MAMMOGRAM  09/22/2020   TETANUS/TDAP  05/15/2021   COLONOSCOPY (Pts 45-81yrs Insurance coverage will need to be confirmed)  01/05/2024   DEXA SCAN  Completed   Hepatitis C Screening  Completed   PNA vac Low Risk Adult  Completed   Zoster Vaccines- Shingrix  Completed   HPV VACCINES  Aged Out      Review of Systems Patient reports no vision/ hearing changes, adenopathy,fever, weight change,  persistant/recurrent hoarseness , swallowing issues, chest pain, palpitations, edema, persistant/recurrent cough, hemoptysis, dyspnea (rest/exertional/paroxysmal nocturnal), gastrointestinal bleeding (melena, rectal bleeding), abdominal pain, significant heartburn, bowel changes, GU symptoms (dysuria, hematuria, incontinence), Gyn symptoms (abnormal  bleeding, pain),  syncope, focal weakness, memory loss, numbness & tingling, skin/hair/nail changes, abnormal bruising or bleeding.   This visit occurred during the SARS-CoV-2 public health emergency.  Safety protocols were in place, including screening questions prior to the visit, additional usage of  staff PPE, and extensive cleaning of exam room while observing appropriate contact time as indicated for disinfecting solutions.      Objective:   Physical Exam General Appearance:    Alert, cooperative, no distress, appears stated age  Head:    Normocephalic, without obvious abnormality, atraumatic  Eyes:    PERRL, conjunctiva/corneas clear, EOM's intact, fundi    benign, both eyes  Ears:    Normal TM's and external ear canals, both ears  Nose:   Deferred due to COVID  Throat:   Neck:   Supple, symmetrical, trachea midline, no adenopathy;    Thyroid: no enlargement/tenderness/nodules  Back:     Symmetric, no curvature, ROM normal, no CVA tenderness  Lungs:     Clear to auscultation bilaterally, respirations unlabored  Chest Wall:    No tenderness or deformity   Heart:    Regular rate and rhythm, S1 and S2 normal, no murmur, rub   or gallop  Breast Exam:    Deferred to GYN  Abdomen:     Soft, non-tender, bowel sounds active all four quadrants,    no masses, no organomegaly  Genitalia:    Deferred to GYN  Rectal:    Extremities:   Extremities normal, atraumatic, no cyanosis or edema  Pulses:   2+ and symmetric all extremities  Skin:   Skin color, texture, turgor normal, no rashes or lesions  Lymph nodes:   Cervical, supraclavicular, and axillary nodes normal  Neurologic:   CNII-XII intact, normal strength, sensation and reflexes    throughout          Assessment & Plan:

## 2020-08-04 NOTE — Assessment & Plan Note (Signed)
Chronic problem.  Well controlled.  Currently asymptomatic.  Check labs.  No anticipated med changes. 

## 2020-08-04 NOTE — Assessment & Plan Note (Signed)
Pt's PE WNL w/ exception of obesity.  UTD on pap, mammo, colonoscopy, immunizations.  Check labs.  Anticipatory guidance provided.

## 2020-08-04 NOTE — Patient Instructions (Addendum)
Follow up in 6 months to recheck BP and cholesterol We'll notify you of your lab results and make any changes if needed Continue to work on healthy diet and regular exercise- you can do it! Call with any questions or concerns Stay Safe!  Stay Healthy! Have a great summer!!!

## 2020-08-05 ENCOUNTER — Other Ambulatory Visit (INDEPENDENT_AMBULATORY_CARE_PROVIDER_SITE_OTHER): Payer: Medicare PPO

## 2020-08-05 DIAGNOSIS — R748 Abnormal levels of other serum enzymes: Secondary | ICD-10-CM | POA: Diagnosis not present

## 2020-08-05 LAB — GAMMA GT: GGT: 26 U/L (ref 7–51)

## 2020-08-10 DIAGNOSIS — J301 Allergic rhinitis due to pollen: Secondary | ICD-10-CM | POA: Diagnosis not present

## 2020-08-10 DIAGNOSIS — J3089 Other allergic rhinitis: Secondary | ICD-10-CM | POA: Diagnosis not present

## 2020-08-10 DIAGNOSIS — J3081 Allergic rhinitis due to animal (cat) (dog) hair and dander: Secondary | ICD-10-CM | POA: Diagnosis not present

## 2020-08-15 ENCOUNTER — Other Ambulatory Visit: Payer: Self-pay

## 2020-08-15 DIAGNOSIS — E785 Hyperlipidemia, unspecified: Secondary | ICD-10-CM

## 2020-08-15 MED ORDER — SIMVASTATIN 10 MG PO TABS: 10.0000 mg | ORAL_TABLET | Freq: Every day | ORAL | 3 refills | Status: DC

## 2020-08-22 ENCOUNTER — Other Ambulatory Visit: Payer: Self-pay

## 2020-08-22 ENCOUNTER — Encounter: Payer: Self-pay | Admitting: Family Medicine

## 2020-08-23 DIAGNOSIS — J3081 Allergic rhinitis due to animal (cat) (dog) hair and dander: Secondary | ICD-10-CM | POA: Diagnosis not present

## 2020-08-23 DIAGNOSIS — J3089 Other allergic rhinitis: Secondary | ICD-10-CM | POA: Diagnosis not present

## 2020-08-23 DIAGNOSIS — J301 Allergic rhinitis due to pollen: Secondary | ICD-10-CM | POA: Diagnosis not present

## 2020-08-30 DIAGNOSIS — J3089 Other allergic rhinitis: Secondary | ICD-10-CM | POA: Diagnosis not present

## 2020-08-30 DIAGNOSIS — J3081 Allergic rhinitis due to animal (cat) (dog) hair and dander: Secondary | ICD-10-CM | POA: Diagnosis not present

## 2020-08-30 DIAGNOSIS — J301 Allergic rhinitis due to pollen: Secondary | ICD-10-CM | POA: Diagnosis not present

## 2020-09-07 DIAGNOSIS — J3081 Allergic rhinitis due to animal (cat) (dog) hair and dander: Secondary | ICD-10-CM | POA: Diagnosis not present

## 2020-09-07 DIAGNOSIS — J301 Allergic rhinitis due to pollen: Secondary | ICD-10-CM | POA: Diagnosis not present

## 2020-09-07 DIAGNOSIS — J3089 Other allergic rhinitis: Secondary | ICD-10-CM | POA: Diagnosis not present

## 2020-09-13 DIAGNOSIS — J3081 Allergic rhinitis due to animal (cat) (dog) hair and dander: Secondary | ICD-10-CM | POA: Diagnosis not present

## 2020-09-13 DIAGNOSIS — J3089 Other allergic rhinitis: Secondary | ICD-10-CM | POA: Diagnosis not present

## 2020-09-13 DIAGNOSIS — J301 Allergic rhinitis due to pollen: Secondary | ICD-10-CM | POA: Diagnosis not present

## 2020-09-19 DIAGNOSIS — J301 Allergic rhinitis due to pollen: Secondary | ICD-10-CM | POA: Diagnosis not present

## 2020-09-19 DIAGNOSIS — J3089 Other allergic rhinitis: Secondary | ICD-10-CM | POA: Diagnosis not present

## 2020-09-19 DIAGNOSIS — J3081 Allergic rhinitis due to animal (cat) (dog) hair and dander: Secondary | ICD-10-CM | POA: Diagnosis not present

## 2020-09-28 DIAGNOSIS — Z124 Encounter for screening for malignant neoplasm of cervix: Secondary | ICD-10-CM | POA: Diagnosis not present

## 2020-09-28 DIAGNOSIS — N393 Stress incontinence (female) (male): Secondary | ICD-10-CM | POA: Diagnosis not present

## 2020-09-28 DIAGNOSIS — Z6833 Body mass index (BMI) 33.0-33.9, adult: Secondary | ICD-10-CM | POA: Diagnosis not present

## 2020-09-28 DIAGNOSIS — Z01419 Encounter for gynecological examination (general) (routine) without abnormal findings: Secondary | ICD-10-CM | POA: Diagnosis not present

## 2020-09-28 DIAGNOSIS — Z1231 Encounter for screening mammogram for malignant neoplasm of breast: Secondary | ICD-10-CM | POA: Diagnosis not present

## 2020-09-28 LAB — HM MAMMOGRAPHY

## 2020-09-28 LAB — HM PAP SMEAR: HM Pap smear: NORMAL

## 2020-10-04 DIAGNOSIS — J3081 Allergic rhinitis due to animal (cat) (dog) hair and dander: Secondary | ICD-10-CM | POA: Diagnosis not present

## 2020-10-04 DIAGNOSIS — J301 Allergic rhinitis due to pollen: Secondary | ICD-10-CM | POA: Diagnosis not present

## 2020-10-04 DIAGNOSIS — J3089 Other allergic rhinitis: Secondary | ICD-10-CM | POA: Diagnosis not present

## 2020-10-05 ENCOUNTER — Encounter: Payer: Self-pay | Admitting: Family Medicine

## 2020-10-06 ENCOUNTER — Other Ambulatory Visit: Payer: Self-pay | Admitting: Gastroenterology

## 2020-10-06 ENCOUNTER — Ambulatory Visit
Admission: RE | Admit: 2020-10-06 | Discharge: 2020-10-06 | Disposition: A | Discharge status: Home / Self Care | Payer: Medicare PPO | Source: Ambulatory Visit | Attending: Gastroenterology | Admitting: Gastroenterology

## 2020-10-06 DIAGNOSIS — K5730 Diverticulosis of large intestine without perforation or abscess without bleeding: Secondary | ICD-10-CM | POA: Diagnosis not present

## 2020-10-06 DIAGNOSIS — J3081 Allergic rhinitis due to animal (cat) (dog) hair and dander: Secondary | ICD-10-CM | POA: Diagnosis not present

## 2020-10-06 DIAGNOSIS — R109 Unspecified abdominal pain: Secondary | ICD-10-CM | POA: Diagnosis not present

## 2020-10-06 DIAGNOSIS — K449 Diaphragmatic hernia without obstruction or gangrene: Secondary | ICD-10-CM | POA: Diagnosis not present

## 2020-10-06 DIAGNOSIS — K5901 Slow transit constipation: Secondary | ICD-10-CM

## 2020-10-06 DIAGNOSIS — J301 Allergic rhinitis due to pollen: Secondary | ICD-10-CM | POA: Diagnosis not present

## 2020-10-06 DIAGNOSIS — J3089 Other allergic rhinitis: Secondary | ICD-10-CM | POA: Diagnosis not present

## 2020-10-21 ENCOUNTER — Other Ambulatory Visit: Payer: Self-pay | Admitting: Family Medicine

## 2020-11-09 DIAGNOSIS — J3081 Allergic rhinitis due to animal (cat) (dog) hair and dander: Secondary | ICD-10-CM | POA: Diagnosis not present

## 2020-11-09 DIAGNOSIS — J3089 Other allergic rhinitis: Secondary | ICD-10-CM | POA: Diagnosis not present

## 2020-11-09 DIAGNOSIS — J301 Allergic rhinitis due to pollen: Secondary | ICD-10-CM | POA: Diagnosis not present

## 2020-11-14 ENCOUNTER — Encounter: Payer: Self-pay | Admitting: Family Medicine

## 2020-11-14 NOTE — Telephone Encounter (Signed)
FYI.  Pt is coming into office 01/2021 for HTN and HLD;

## 2020-11-17 DIAGNOSIS — H2513 Age-related nuclear cataract, bilateral: Secondary | ICD-10-CM | POA: Diagnosis not present

## 2020-11-17 DIAGNOSIS — Z135 Encounter for screening for eye and ear disorders: Secondary | ICD-10-CM | POA: Diagnosis not present

## 2020-11-22 ENCOUNTER — Encounter: Payer: Self-pay | Admitting: Family Medicine

## 2020-11-25 DIAGNOSIS — J3089 Other allergic rhinitis: Secondary | ICD-10-CM | POA: Diagnosis not present

## 2020-11-25 DIAGNOSIS — J3081 Allergic rhinitis due to animal (cat) (dog) hair and dander: Secondary | ICD-10-CM | POA: Diagnosis not present

## 2020-11-25 DIAGNOSIS — J301 Allergic rhinitis due to pollen: Secondary | ICD-10-CM | POA: Diagnosis not present

## 2020-12-22 DIAGNOSIS — J301 Allergic rhinitis due to pollen: Secondary | ICD-10-CM | POA: Diagnosis not present

## 2020-12-22 DIAGNOSIS — J3081 Allergic rhinitis due to animal (cat) (dog) hair and dander: Secondary | ICD-10-CM | POA: Diagnosis not present

## 2020-12-22 DIAGNOSIS — J3089 Other allergic rhinitis: Secondary | ICD-10-CM | POA: Diagnosis not present

## 2021-01-05 ENCOUNTER — Telehealth: Payer: Self-pay | Admitting: Family Medicine

## 2021-01-10 DIAGNOSIS — J301 Allergic rhinitis due to pollen: Secondary | ICD-10-CM | POA: Diagnosis not present

## 2021-01-10 DIAGNOSIS — J3081 Allergic rhinitis due to animal (cat) (dog) hair and dander: Secondary | ICD-10-CM | POA: Diagnosis not present

## 2021-01-10 DIAGNOSIS — J3089 Other allergic rhinitis: Secondary | ICD-10-CM | POA: Diagnosis not present

## 2021-01-17 ENCOUNTER — Other Ambulatory Visit: Payer: Self-pay | Admitting: Family Medicine

## 2021-02-03 ENCOUNTER — Ambulatory Visit: Payer: Medicare PPO | Admitting: Family Medicine

## 2021-02-09 DIAGNOSIS — J301 Allergic rhinitis due to pollen: Secondary | ICD-10-CM | POA: Diagnosis not present

## 2021-02-09 DIAGNOSIS — J3081 Allergic rhinitis due to animal (cat) (dog) hair and dander: Secondary | ICD-10-CM | POA: Diagnosis not present

## 2021-02-09 DIAGNOSIS — J3089 Other allergic rhinitis: Secondary | ICD-10-CM | POA: Diagnosis not present

## 2021-02-17 ENCOUNTER — Ambulatory Visit: Payer: Medicare PPO | Admitting: Family Medicine

## 2021-02-28 DIAGNOSIS — J3081 Allergic rhinitis due to animal (cat) (dog) hair and dander: Secondary | ICD-10-CM | POA: Diagnosis not present

## 2021-02-28 DIAGNOSIS — J3089 Other allergic rhinitis: Secondary | ICD-10-CM | POA: Diagnosis not present

## 2021-02-28 DIAGNOSIS — J301 Allergic rhinitis due to pollen: Secondary | ICD-10-CM | POA: Diagnosis not present

## 2021-03-01 ENCOUNTER — Ambulatory Visit: Payer: Medicare PPO | Admitting: Family Medicine

## 2021-03-06 DIAGNOSIS — J3089 Other allergic rhinitis: Secondary | ICD-10-CM | POA: Diagnosis not present

## 2021-03-06 DIAGNOSIS — J3081 Allergic rhinitis due to animal (cat) (dog) hair and dander: Secondary | ICD-10-CM | POA: Diagnosis not present

## 2021-03-06 DIAGNOSIS — J301 Allergic rhinitis due to pollen: Secondary | ICD-10-CM | POA: Diagnosis not present

## 2021-03-09 ENCOUNTER — Other Ambulatory Visit: Payer: Self-pay | Admitting: Allergy

## 2021-03-09 ENCOUNTER — Ambulatory Visit
Admission: RE | Admit: 2021-03-09 | Discharge: 2021-03-09 | Disposition: A | Discharge status: Home / Self Care | Payer: Medicare PPO | Source: Ambulatory Visit | Attending: Allergy | Admitting: Allergy

## 2021-03-09 DIAGNOSIS — R059 Cough, unspecified: Secondary | ICD-10-CM | POA: Diagnosis not present

## 2021-03-09 DIAGNOSIS — J3089 Other allergic rhinitis: Secondary | ICD-10-CM | POA: Diagnosis not present

## 2021-03-09 DIAGNOSIS — J209 Acute bronchitis, unspecified: Secondary | ICD-10-CM | POA: Diagnosis not present

## 2021-03-09 DIAGNOSIS — J301 Allergic rhinitis due to pollen: Secondary | ICD-10-CM | POA: Diagnosis not present

## 2021-03-13 ENCOUNTER — Other Ambulatory Visit: Payer: Self-pay | Admitting: Family Medicine

## 2021-03-13 ENCOUNTER — Ambulatory Visit: Payer: Medicare PPO | Admitting: Family Medicine

## 2021-03-13 ENCOUNTER — Encounter: Payer: Self-pay | Admitting: Family Medicine

## 2021-03-13 VITALS — BP 142/90 | HR 78 | Temp 98.3°F | Resp 16 | Wt 203.8 lb

## 2021-03-13 DIAGNOSIS — I10 Essential (primary) hypertension: Secondary | ICD-10-CM | POA: Diagnosis not present

## 2021-03-13 DIAGNOSIS — R32 Unspecified urinary incontinence: Secondary | ICD-10-CM | POA: Insufficient documentation

## 2021-03-13 DIAGNOSIS — J301 Allergic rhinitis due to pollen: Secondary | ICD-10-CM | POA: Diagnosis not present

## 2021-03-13 DIAGNOSIS — J3081 Allergic rhinitis due to animal (cat) (dog) hair and dander: Secondary | ICD-10-CM | POA: Diagnosis not present

## 2021-03-13 DIAGNOSIS — E785 Hyperlipidemia, unspecified: Secondary | ICD-10-CM

## 2021-03-13 DIAGNOSIS — J3089 Other allergic rhinitis: Secondary | ICD-10-CM | POA: Diagnosis not present

## 2021-03-13 DIAGNOSIS — E669 Obesity, unspecified: Secondary | ICD-10-CM | POA: Diagnosis not present

## 2021-03-13 DIAGNOSIS — E038 Other specified hypothyroidism: Secondary | ICD-10-CM

## 2021-03-13 DIAGNOSIS — R059 Cough, unspecified: Secondary | ICD-10-CM | POA: Insufficient documentation

## 2021-03-13 LAB — LIPID PANEL
Cholesterol: 189 mg/dL (ref 0–200)
HDL: 75.7 mg/dL (ref >39.00)
LDL Cholesterol: 97 mg/dL (ref 0–99)
NonHDL: 113.09
Total CHOL/HDL Ratio: 2
Triglycerides: 82 mg/dL (ref 0.0–149.0)
VLDL: 16.4 mg/dL (ref 0.0–40.0)

## 2021-03-13 LAB — CBC WITH DIFFERENTIAL/PLATELET
Basophils Absolute: 0 10*3/uL (ref 0.0–0.1)
Basophils Relative: 0.5 % (ref 0.0–3.0)
Eosinophils Absolute: 0 10*3/uL (ref 0.0–0.7)
Eosinophils Relative: 0.1 % (ref 0.0–5.0)
HCT: 42.3 % (ref 36.0–46.0)
Hemoglobin: 14 g/dL (ref 12.0–15.0)
Lymphocytes Relative: 20.7 % (ref 12.0–46.0)
Lymphs Abs: 2 10*3/uL (ref 0.7–4.0)
MCHC: 33.1 g/dL (ref 30.0–36.0)
MCV: 102.4 fl — ABNORMAL HIGH (ref 78.0–100.0)
Monocytes Absolute: 1 10*3/uL (ref 0.1–1.0)
Monocytes Relative: 10.6 % (ref 3.0–12.0)
Neutro Abs: 6.7 10*3/uL (ref 1.4–7.7)
Neutrophils Relative %: 68.1 % (ref 43.0–77.0)
Platelets: 312 10*3/uL (ref 150.0–400.0)
RBC: 4.14 Mil/uL (ref 3.87–5.11)
RDW: 12.7 % (ref 11.5–15.5)
WBC: 9.8 10*3/uL (ref 4.0–10.5)

## 2021-03-13 LAB — HEPATIC FUNCTION PANEL
ALT: 12 U/L (ref 0–35)
AST: 12 U/L (ref 0–37)
Albumin: 4 g/dL (ref 3.5–5.2)
Alkaline Phosphatase: 97 U/L (ref 39–117)
Bilirubin, Direct: 0.1 mg/dL (ref 0.0–0.3)
Total Bilirubin: 0.6 mg/dL (ref 0.2–1.2)
Total Protein: 6.4 g/dL (ref 6.0–8.3)

## 2021-03-13 LAB — BASIC METABOLIC PANEL
BUN: 29 mg/dL — ABNORMAL HIGH (ref 6–23)
CO2: 29 mEq/L (ref 19–32)
Calcium: 9.6 mg/dL (ref 8.4–10.5)
Chloride: 102 mEq/L (ref 96–112)
Creatinine, Ser: 1.01 mg/dL (ref 0.40–1.20)
GFR: 57.65 mL/min — ABNORMAL LOW (ref >60.00)
Glucose, Bld: 86 mg/dL (ref 70–99)
Potassium: 4.1 mEq/L (ref 3.5–5.1)
Sodium: 139 mEq/L (ref 135–145)

## 2021-03-13 LAB — TSH: TSH: 0.58 u[IU]/mL (ref 0.35–5.50)

## 2021-03-13 NOTE — Assessment & Plan Note (Signed)
New to provider, ongoing for pt.  Will refer to Urology for complete evaluation and tx.

## 2021-03-13 NOTE — Assessment & Plan Note (Signed)
Chronic problem.  Currently asymptomatic.  Check labs.  Adjust meds prn  

## 2021-03-13 NOTE — Progress Notes (Signed)
Orders entered for pt's upcoming visit

## 2021-03-13 NOTE — Patient Instructions (Addendum)
Schedule your complete physical in 6 months We'll notify you of your lab results and make any changes if needed Continue to work on healthy diet and regular exercise- you can do it! We'll call you with your urology referral Call with any questions or concerns Stay Safe!  Stay Healthy! Happy Valentine's Day!

## 2021-03-13 NOTE — Assessment & Plan Note (Signed)
Chronic problem, on Simvastatin 10mg  daily w/o difficulty.  Encouraged healthy diet and regular exercise.  Check labs.  Adjust meds prn

## 2021-03-13 NOTE — Assessment & Plan Note (Signed)
Deteriorated.  Pt reports increased weight gain since starting Abilify.  Encouraged healthy diet and regular exercise.  Will follow.

## 2021-03-13 NOTE — Assessment & Plan Note (Signed)
Chronic problem.  On Spironolactone daily and usually well controlled.  Pt had depo medrol injxn late last week which has likely increased her BP.  Will follow.

## 2021-03-13 NOTE — Progress Notes (Signed)
° °  Subjective:    Patient ID: Clint Lipps, female    DOB: 1955/12/27, 66 y.o.   MRN: [id number]  HPI HTN- chronic problem, on Spironolactone 50mg  daily.  BP is elevated today.  She had a depo medrol shot on Thursday.  Denies CP, SOB, HAs, visual changes, edema.  Hyperlipidemia- chronic problem.  On Simvastatin 10mg  daily.  Denies abd pain, N/V.  Hypothyroid- chronic problem, on Levothyroxine 76mcg daily.  No change in energy level, no changes to skin/hair/nails.  Obesity- pt has gained 5 lbs since starting Abilify.  She has made big changes to her diet to try and control weight gain.  She is trying to eat low carb and be very mindful of what she is eating.  Incontinence- pt reports she will go through 10 [place] pads daily.  Has discussed w/ GYN.  Has a urologist but has not seen.  Pt reports she will void upon standing   Review of Systems For ROS see HPI   This visit occurred during the SARS-CoV-2 public health emergency.  Safety protocols were in place, including screening questions prior to the visit, additional usage of staff PPE, and extensive cleaning of exam room while observing appropriate contact time as indicated for disinfecting solutions.      Objective:   Physical Exam Vitals reviewed.  Constitutional:      General: She is not in acute distress.    Appearance: Normal appearance. She is well-developed. She is obese. She is not ill-appearing.  HENT:     Head: Normocephalic and atraumatic.  Eyes:     Conjunctiva/sclera: Conjunctivae normal.     Pupils: Pupils are equal, round, and reactive to light.  Neck:     Thyroid: No thyromegaly.  Cardiovascular:     Rate and Rhythm: Normal rate and regular rhythm.     Pulses: Normal pulses.     Heart sounds: Normal heart sounds. No murmur heard. Pulmonary:     Effort: Pulmonary effort is normal. No respiratory distress.     Breath sounds: Normal breath sounds.  Abdominal:     General: There is no distension.      Palpations: Abdomen is soft.     Tenderness: There is no abdominal tenderness.  Musculoskeletal:     Cervical back: Normal range of motion and neck supple.     Right lower leg: No edema.     Left lower leg: No edema.  Lymphadenopathy:     Cervical: No cervical adenopathy.  Skin:    General: Skin is warm and dry.  Neurological:     Mental Status: She is alert and oriented to person, place, and time.     Comments: Near constant involuntary leg movements  Psychiatric:        Mood and Affect: Mood normal.        Behavior: Behavior normal.        Thought Content: Thought content normal.          Assessment & Plan:

## 2021-03-18 ENCOUNTER — Encounter: Payer: Self-pay | Admitting: Family Medicine

## 2021-03-20 ENCOUNTER — Other Ambulatory Visit: Payer: Self-pay

## 2021-03-23 DIAGNOSIS — J3081 Allergic rhinitis due to animal (cat) (dog) hair and dander: Secondary | ICD-10-CM | POA: Diagnosis not present

## 2021-03-23 DIAGNOSIS — J301 Allergic rhinitis due to pollen: Secondary | ICD-10-CM | POA: Diagnosis not present

## 2021-03-23 DIAGNOSIS — J3089 Other allergic rhinitis: Secondary | ICD-10-CM | POA: Diagnosis not present

## 2021-03-30 DIAGNOSIS — J3089 Other allergic rhinitis: Secondary | ICD-10-CM | POA: Diagnosis not present

## 2021-03-30 DIAGNOSIS — J301 Allergic rhinitis due to pollen: Secondary | ICD-10-CM | POA: Diagnosis not present

## 2021-03-30 DIAGNOSIS — J3081 Allergic rhinitis due to animal (cat) (dog) hair and dander: Secondary | ICD-10-CM | POA: Diagnosis not present

## 2021-03-30 DIAGNOSIS — R3915 Urgency of urination: Secondary | ICD-10-CM | POA: Diagnosis not present

## 2021-03-30 DIAGNOSIS — N3942 Incontinence without sensory awareness: Secondary | ICD-10-CM | POA: Diagnosis not present

## 2021-04-03 DIAGNOSIS — N3942 Incontinence without sensory awareness: Secondary | ICD-10-CM | POA: Diagnosis not present

## 2021-04-03 DIAGNOSIS — J3081 Allergic rhinitis due to animal (cat) (dog) hair and dander: Secondary | ICD-10-CM | POA: Diagnosis not present

## 2021-04-03 DIAGNOSIS — J3089 Other allergic rhinitis: Secondary | ICD-10-CM | POA: Diagnosis not present

## 2021-04-03 DIAGNOSIS — J301 Allergic rhinitis due to pollen: Secondary | ICD-10-CM | POA: Diagnosis not present

## 2021-04-04 ENCOUNTER — Encounter: Payer: Self-pay | Admitting: Family Medicine

## 2021-04-12 DIAGNOSIS — J3089 Other allergic rhinitis: Secondary | ICD-10-CM | POA: Diagnosis not present

## 2021-04-12 DIAGNOSIS — J301 Allergic rhinitis due to pollen: Secondary | ICD-10-CM | POA: Diagnosis not present

## 2021-04-12 DIAGNOSIS — J3081 Allergic rhinitis due to animal (cat) (dog) hair and dander: Secondary | ICD-10-CM | POA: Diagnosis not present

## 2021-04-13 ENCOUNTER — Encounter: Payer: Self-pay | Admitting: Family Medicine

## 2021-04-27 DIAGNOSIS — J3089 Other allergic rhinitis: Secondary | ICD-10-CM | POA: Diagnosis not present

## 2021-04-27 DIAGNOSIS — J301 Allergic rhinitis due to pollen: Secondary | ICD-10-CM | POA: Diagnosis not present

## 2021-04-27 DIAGNOSIS — J3081 Allergic rhinitis due to animal (cat) (dog) hair and dander: Secondary | ICD-10-CM | POA: Diagnosis not present

## 2021-05-02 DIAGNOSIS — J3081 Allergic rhinitis due to animal (cat) (dog) hair and dander: Secondary | ICD-10-CM | POA: Diagnosis not present

## 2021-05-02 DIAGNOSIS — J3089 Other allergic rhinitis: Secondary | ICD-10-CM | POA: Diagnosis not present

## 2021-05-02 DIAGNOSIS — J301 Allergic rhinitis due to pollen: Secondary | ICD-10-CM | POA: Diagnosis not present

## 2021-05-03 DIAGNOSIS — J301 Allergic rhinitis due to pollen: Secondary | ICD-10-CM | POA: Diagnosis not present

## 2021-05-03 DIAGNOSIS — J3081 Allergic rhinitis due to animal (cat) (dog) hair and dander: Secondary | ICD-10-CM | POA: Diagnosis not present

## 2021-05-03 DIAGNOSIS — J3089 Other allergic rhinitis: Secondary | ICD-10-CM | POA: Diagnosis not present

## 2021-05-09 DIAGNOSIS — L821 Other seborrheic keratosis: Secondary | ICD-10-CM | POA: Diagnosis not present

## 2021-05-09 DIAGNOSIS — J3081 Allergic rhinitis due to animal (cat) (dog) hair and dander: Secondary | ICD-10-CM | POA: Diagnosis not present

## 2021-05-09 DIAGNOSIS — J301 Allergic rhinitis due to pollen: Secondary | ICD-10-CM | POA: Diagnosis not present

## 2021-05-09 DIAGNOSIS — D1801 Hemangioma of skin and subcutaneous tissue: Secondary | ICD-10-CM | POA: Diagnosis not present

## 2021-05-09 DIAGNOSIS — L700 Acne vulgaris: Secondary | ICD-10-CM | POA: Diagnosis not present

## 2021-05-09 DIAGNOSIS — J3089 Other allergic rhinitis: Secondary | ICD-10-CM | POA: Diagnosis not present

## 2021-05-09 DIAGNOSIS — L814 Other melanin hyperpigmentation: Secondary | ICD-10-CM | POA: Diagnosis not present

## 2021-05-26 DIAGNOSIS — J3089 Other allergic rhinitis: Secondary | ICD-10-CM | POA: Diagnosis not present

## 2021-05-26 DIAGNOSIS — J3081 Allergic rhinitis due to animal (cat) (dog) hair and dander: Secondary | ICD-10-CM | POA: Diagnosis not present

## 2021-05-26 DIAGNOSIS — K5901 Slow transit constipation: Secondary | ICD-10-CM | POA: Diagnosis not present

## 2021-05-26 DIAGNOSIS — J301 Allergic rhinitis due to pollen: Secondary | ICD-10-CM | POA: Diagnosis not present

## 2021-06-06 DIAGNOSIS — J3089 Other allergic rhinitis: Secondary | ICD-10-CM | POA: Diagnosis not present

## 2021-06-06 DIAGNOSIS — R052 Subacute cough: Secondary | ICD-10-CM | POA: Diagnosis not present

## 2021-06-06 DIAGNOSIS — J301 Allergic rhinitis due to pollen: Secondary | ICD-10-CM | POA: Diagnosis not present

## 2021-06-12 ENCOUNTER — Ambulatory Visit (INDEPENDENT_AMBULATORY_CARE_PROVIDER_SITE_OTHER): Payer: Medicare PPO | Admitting: Family Medicine

## 2021-06-12 ENCOUNTER — Encounter: Payer: Self-pay | Admitting: Family Medicine

## 2021-06-12 VITALS — BP 122/82 | HR 90 | Temp 98.7°F | Resp 17 | Ht 65.0 in | Wt 195.0 lb

## 2021-06-12 DIAGNOSIS — J3089 Other allergic rhinitis: Secondary | ICD-10-CM | POA: Diagnosis not present

## 2021-06-12 DIAGNOSIS — M67431 Ganglion, right wrist: Secondary | ICD-10-CM

## 2021-06-12 DIAGNOSIS — J3081 Allergic rhinitis due to animal (cat) (dog) hair and dander: Secondary | ICD-10-CM | POA: Diagnosis not present

## 2021-06-12 DIAGNOSIS — J301 Allergic rhinitis due to pollen: Secondary | ICD-10-CM | POA: Diagnosis not present

## 2021-06-12 NOTE — Patient Instructions (Signed)
Follow up as needed or as scheduled ?We'll call you with your hand appt ?If it swells or is painful- ICE! ?Call with any questions or concerns ?Stay Safe!  Stay Healthy! ?Happy Mother's Day!!! ?

## 2021-06-12 NOTE — Progress Notes (Signed)
? ?  Subjective:  ? ? Patient ID: Marcia Adams, female    DOB: [birth year], 66 y.o.   MRN: [id number] ? ?HPI ?Cyst- 'when I called here Thursday afternoon, half my arm was swollen to twice its normal size'.  Pt reports swelling resolved by next day.  Not painful.  Now notes small area that she thinks is a ganglion cyst- she has had this before.   ? ? ?Review of Systems ?For ROS see HPI  ?   ?Objective:  ? Physical Exam ?Vitals reviewed.  ?Constitutional:   ?   General: She is not in acute distress. ?   Appearance: Normal appearance. She is not ill-appearing.  ?Cardiovascular:  ?   Pulses: Normal pulses.  ?Musculoskeletal:     ?   General: Swelling (freely mobile soft tissue mass on R ventral wrist, no TTP) present.  ?Skin: ?   General: Skin is warm and dry.  ?Neurological:  ?   Mental Status: She is alert.  ?Psychiatric:     ?   Mood and Affect: Mood normal.     ?   Behavior: Behavior normal.     ?   Thought Content: Thought content normal.  ? ? ? ? ? ?   ?Assessment & Plan:  ? ?Ganglion cyst- new.  R wrist.  Not painful, freely mobile.  Pt is interested in seeing hand specialist to discuss tx options.  Referral placed. ?

## 2021-06-22 ENCOUNTER — Other Ambulatory Visit: Payer: Self-pay

## 2021-06-22 DIAGNOSIS — E038 Other specified hypothyroidism: Secondary | ICD-10-CM

## 2021-06-22 MED ORDER — LEVOTHYROXINE SODIUM 88 MCG PO TABS: ORAL_TABLET | ORAL | 0 refills | Status: DC

## 2021-06-23 ENCOUNTER — Other Ambulatory Visit: Payer: Self-pay

## 2021-06-23 DIAGNOSIS — E038 Other specified hypothyroidism: Secondary | ICD-10-CM

## 2021-06-23 MED ORDER — LEVOTHYROXINE SODIUM 88 MCG PO TABS: ORAL_TABLET | ORAL | 0 refills | Status: DC

## 2021-06-27 ENCOUNTER — Other Ambulatory Visit: Payer: Self-pay | Admitting: Orthopedic Surgery

## 2021-06-27 DIAGNOSIS — M25531 Pain in right wrist: Secondary | ICD-10-CM | POA: Diagnosis not present

## 2021-06-27 DIAGNOSIS — R2231 Localized swelling, mass and lump, right upper limb: Secondary | ICD-10-CM | POA: Diagnosis not present

## 2021-06-27 DIAGNOSIS — J3089 Other allergic rhinitis: Secondary | ICD-10-CM | POA: Diagnosis not present

## 2021-06-27 DIAGNOSIS — J301 Allergic rhinitis due to pollen: Secondary | ICD-10-CM | POA: Diagnosis not present

## 2021-06-27 DIAGNOSIS — J3081 Allergic rhinitis due to animal (cat) (dog) hair and dander: Secondary | ICD-10-CM | POA: Diagnosis not present

## 2021-07-11 ENCOUNTER — Ambulatory Visit
Admission: RE | Admit: 2021-07-11 | Discharge: 2021-07-11 | Disposition: A | Discharge status: Home / Self Care | Payer: Medicare PPO | Source: Ambulatory Visit | Attending: Orthopedic Surgery | Admitting: Orthopedic Surgery

## 2021-07-11 DIAGNOSIS — R2231 Localized swelling, mass and lump, right upper limb: Secondary | ICD-10-CM

## 2021-07-11 DIAGNOSIS — J3089 Other allergic rhinitis: Secondary | ICD-10-CM | POA: Diagnosis not present

## 2021-07-11 DIAGNOSIS — J3081 Allergic rhinitis due to animal (cat) (dog) hair and dander: Secondary | ICD-10-CM | POA: Diagnosis not present

## 2021-07-11 DIAGNOSIS — J301 Allergic rhinitis due to pollen: Secondary | ICD-10-CM | POA: Diagnosis not present

## 2021-07-18 DIAGNOSIS — J3089 Other allergic rhinitis: Secondary | ICD-10-CM | POA: Diagnosis not present

## 2021-07-18 DIAGNOSIS — J3081 Allergic rhinitis due to animal (cat) (dog) hair and dander: Secondary | ICD-10-CM | POA: Diagnosis not present

## 2021-07-18 DIAGNOSIS — J301 Allergic rhinitis due to pollen: Secondary | ICD-10-CM | POA: Diagnosis not present

## 2021-07-21 DIAGNOSIS — J301 Allergic rhinitis due to pollen: Secondary | ICD-10-CM | POA: Diagnosis not present

## 2021-07-21 DIAGNOSIS — J3089 Other allergic rhinitis: Secondary | ICD-10-CM | POA: Diagnosis not present

## 2021-07-21 DIAGNOSIS — R2231 Localized swelling, mass and lump, right upper limb: Secondary | ICD-10-CM | POA: Diagnosis not present

## 2021-07-21 DIAGNOSIS — J3081 Allergic rhinitis due to animal (cat) (dog) hair and dander: Secondary | ICD-10-CM | POA: Diagnosis not present

## 2021-08-09 ENCOUNTER — Other Ambulatory Visit: Payer: Self-pay

## 2021-08-09 DIAGNOSIS — E785 Hyperlipidemia, unspecified: Secondary | ICD-10-CM

## 2021-08-09 MED ORDER — SIMVASTATIN 10 MG PO TABS: 10.0000 mg | ORAL_TABLET | Freq: Every day | ORAL | 3 refills | Status: DC

## 2021-08-16 DIAGNOSIS — J3081 Allergic rhinitis due to animal (cat) (dog) hair and dander: Secondary | ICD-10-CM | POA: Diagnosis not present

## 2021-08-16 DIAGNOSIS — J301 Allergic rhinitis due to pollen: Secondary | ICD-10-CM | POA: Diagnosis not present

## 2021-08-16 DIAGNOSIS — J3089 Other allergic rhinitis: Secondary | ICD-10-CM | POA: Diagnosis not present

## 2021-08-21 ENCOUNTER — Encounter: Payer: Self-pay | Admitting: Podiatry

## 2021-08-21 ENCOUNTER — Ambulatory Visit (INDEPENDENT_AMBULATORY_CARE_PROVIDER_SITE_OTHER): Payer: Medicare PPO

## 2021-08-21 ENCOUNTER — Ambulatory Visit: Payer: Medicare PPO | Admitting: Podiatry

## 2021-08-21 DIAGNOSIS — S9031XA Contusion of right foot, initial encounter: Secondary | ICD-10-CM

## 2021-08-21 DIAGNOSIS — S93601A Unspecified sprain of right foot, initial encounter: Secondary | ICD-10-CM | POA: Diagnosis not present

## 2021-08-21 NOTE — Progress Notes (Signed)
She presents today states that she was in the pool with her granddaughter last Friday, August 18, 2021 when she stepped wrong rolling her right foot.  She states that it was swollen black and blue and had not been to be carted around all weekend.  She states that she is At elevated trying to stay off of it and ice it.  Objective: Vital signs are stable she is alert and oriented x3.  There is some ecchymosis overlying the third fourth fifth metatarsal and inferior ankle area.  These are the areas that are tender particularly the third fourth fifth tarsometatarsal joints.  Muscle strength is normal dorsiflexors plantar flexors inverters and everters are all intact.  Radiographs taken do not demonstrate any type of osseous abnormalities other than osteopenia and some joint space narrowing but I did not see any fractures.  Assessment probable sprain of the ligaments at the tarsometatarsal joints.  Plan: Instructed her to continue the RICE therapy and place her foot in her cam walker which she already has at home.  I would like to follow-up with her in 1 month for another set of x-rays if not considerably improved.

## 2021-08-24 ENCOUNTER — Ambulatory Visit (INDEPENDENT_AMBULATORY_CARE_PROVIDER_SITE_OTHER): Payer: Medicare PPO

## 2021-08-24 DIAGNOSIS — Z0000 Encounter for general adult medical examination without abnormal findings: Secondary | ICD-10-CM | POA: Diagnosis not present

## 2021-08-24 NOTE — Progress Notes (Signed)
Subjective:   Marcia Adams is a 66 y.o. female who presents for Medicare Annual (Subsequent) preventive examination.   I connected with Lonzo Candy  today by telephone and verified that I am speaking with the correct person using two identifiers. Location patient: home Location provider: work Persons participating in the virtual visit: patient, provider.   I discussed the limitations, risks, security and privacy concerns of performing an evaluation and management service by telephone and the availability of in person appointments. I also discussed with the patient that there may be a patient responsible charge related to this service. The patient expressed understanding and verbally consented to this telephonic visit.    Interactive audio and video telecommunications were attempted between this provider and patient, however failed, due to patient having technical difficulties OR patient did not have access to video capability.  We continued and completed visit with audio only.    Review of Systems     Cardiac Risk Factors include: advanced age (>28mn, >>12women)     Objective:    Today's Vitals   There is no height or weight on file to calculate BMI.     08/24/2021   12:57 PM 07/18/2020    1:53 PM 07/14/2019    9:02 AM 12/06/2016    5:07 AM 02/23/2014    2:38 PM 12/15/2013    3:31 PM 11/22/2013    8:51 PM  Advanced Directives  Does Patient Have a Medical Advance Directive? Yes Yes Yes No;Yes No No No  Type of AParamedicof ALyonsLiving will HBlooms[place]Living will HVenedociaLiving will HEurekaLiving will     Does patient want to make changes to medical advance directive?   Yes (MAU/Ambulatory/Procedural Areas - Information given)      Copy of HBramwellin Chart? No - copy requested No - copy requested No - copy requested Yes     Would patient like information on  creating a medical advance directive?     No - patient declined information No - patient declined information Yes - Educational materials given    Current Medications (verified) Outpatient Encounter Medications as of 08/24/2021  Medication Sig   acyclovir (ZOVIRAX) 400 MG tablet    buPROPion HCl ER, XL, 450 MG TB24 SMARTSIG:1 By Mouth   clindamycin (CLINDAGEL) 1 % gel Apply topically daily.    D 1000 25 MCG (1000 UT) capsule SMARTSIG:1 By Mouth   Doxepin HCl 6 MG TABS    EPIPEN 2-PAK 0.3 MG/0.3ML DEVI Inject 0.3 mg into the muscle daily as needed (allergic reaction).    Estradiol 10 MCG TABS vaginal tablet Place vaginally.   Eszopiclone 3 MG TABS Take 3 mg by mouth at bedtime. Take immediately before bedtime   famotidine (PEPCID) 10 MG tablet Take 10 mg by mouth 2 (two) times daily.   fluticasone (FLONASE) 50 MCG/ACT nasal spray SMARTSIG:1-2 Spray(s) Both Nares Daily   L-Methylfolate 15 MG TABS    levothyroxine (SYNTHROID) 75 MCG tablet Take by mouth.   levothyroxine (SYNTHROID) 88 MCG tablet Take one tablet daily   LINZESS 72 MCG capsule Take 72 mcg by mouth every morning.   loratadine (CLARITIN) 10 MG tablet Take 10 mg by mouth at bedtime.   LORazepam (ATIVAN) 1 MG tablet Add'l Sig Add'l Sig oral Add'l Sig   montelukast (SINGULAIR) 10 MG tablet TK 1 T PO QD IN THE EVE   Probiotic Product (PROBIOTIC DAILY PO) Take  by mouth.   Probiotic, Lactobacillus, CAPS SMARTSIG:1 By Mouth   simvastatin (ZOCOR) 10 MG tablet Take 1 tablet (10 mg total) by mouth at bedtime.   spironolactone (ALDACTONE) 50 MG tablet Take 50 mg by mouth 2 (two) times daily.    tazarotene (AVAGE) 0.1 % cream Apply topically at bedtime.   vortioxetine HBr (TRINTELLIX) 20 MG TABS tablet Take 20 mg by mouth daily.   [DISCONTINUED] azelastine (ASTELIN) 137 MCG/SPRAY nasal spray One spray in each nostril twice daily as needed for allergies.    No facility-administered encounter medications on file as of 08/24/2021.     Allergies (verified) Penicillins, Ciprofloxacin, Aripiprazole, Aripiprazole, Escitalopram oxalate, Levocetirizine dihydrochloride, and Enalapril   History: Past Medical History:  Diagnosis Date   Allergic rhinitis    Allergy    Anemia    Anxiety    Depression    Diverticulitis    Esophageal stricture    GERD (gastroesophageal reflux disease)    Heart murmur    mild since birth   Hiatal hernia    Hyperlipidemia    Hypertension    Hypothyroidism    Insomnia    Kidney stones    Menopause    Renal insufficiency    Vitamin D deficiency    Past Surgical History:  Procedure Laterality Date   FOOT TENDON SURGERY Left    HEMORRHOID SURGERY     MASS EXCISION  06/08/2011   Procedure: MINOR EXCISION OF MASS;  Surgeon: Cammie Sickle., MD;  Location: [facility];  Service: Orthopedics;  Laterality: Right;  excisional biopsy dorsal right hand   NASAL SINUS SURGERY  4/56   NISSEN FUNDOPLICATION  2/56   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  Pt not to lye completely flat.   STOMACH SURGERY     UPPER GASTROINTESTINAL ENDOSCOPY     Family History  Problem Relation Age of Onset   COPD Mother    Congestive Heart Failure Mother    Dementia Mother    COPD Father    Congestive Heart Failure Father    Depression Brother    Urolithiasis Brother    Arthritis Maternal Grandmother    Kidney cancer Maternal Grandfather    Eczema Daughter    Colon cancer Neg Hx    Esophageal cancer Neg Hx    Rectal cancer Neg Hx    Stomach cancer Neg Hx    Social History   Socioeconomic History   Marital status: Divorced    Spouse name: Not on file   Number of children: 2   Years of education: Not on file   Highest education level: Not on file  Occupational History   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[employer]  Tobacco Use   Smoking status: Never   Smokeless tobacco: Never  Vaping Use    Vaping Use: Never used  Substance and Sexual Activity   Alcohol use: Not Currently    Comment: occasionally   Drug use: No   Sexual activity: Never    Birth control/protection: None  Other Topics Concern   Not on file  Social History Narrative   Not on file   Social Determinants of Health   Financial Resource Strain: Low Risk  (08/24/2021)   Overall Financial Resource Strain (CARDIA)    Difficulty of Paying Living Expenses: Not hard at all  Food Insecurity: No Food Insecurity (08/24/2021)   Hunger  Vital Sign    Worried About Charity fundraiser in the Last Year: Never true    Ran Out of Food in the Last Year: Never true  Transportation Needs: No Transportation Needs (08/24/2021)   PRAPARE - Hydrologist (Medical): No    Lack of Transportation (Non-Medical): No  Physical Activity: Inactive (08/24/2021)   Exercise Vital Sign    Days of Exercise per Week: 0 days    Minutes of Exercise per Session: 0 min  Stress: No Stress Concern Present (08/24/2021)   [place]    Feeling of Stress : Not at all  Social Connections: Socially Isolated (08/24/2021)   Social Connection and Isolation Panel [NHANES]    Frequency of Communication with Friends and Family: Three times a week    Frequency of Social Gatherings with Friends and Family: Three times a week    Attends Religious Services: Never    Active Member of Clubs or Organizations: No    Attends Archivist Meetings: Never    Marital Status: Divorced    Tobacco Counseling Counseling given: Not Answered   Clinical Intake:  Pre-visit preparation completed: Yes  Pain : No/denies pain     Nutritional Risks: None Diabetes: No  How often do you need to have someone help you when you read instructions, pamphlets, or other written materials from your doctor or pharmacy?: 1 - Never What is the last grade level you completed in  school?: masters  Diabetic?no  Interpreter Needed?: No  Information entered by :: L.Wil;son,LPN   Activities of Daily Living    08/24/2021    1:01 PM 08/23/2021    1:59 PM  In your present state of health, do you have any difficulty performing the following activities:  Hearing? 0 0  Vision? 0 0  Difficulty concentrating or making decisions? 0 0  Walking or climbing stairs? 0 0  Dressing or bathing? 0 0  Doing errands, shopping? 0 0  Preparing Food and eating ? N N  Using the Toilet? N N  In the past six months, have you accidently leaked urine? N N  Do you have problems with loss of bowel control? N N  Managing your Medications? N N  Managing your Finances? N N  Housekeeping or managing your Housekeeping? N N    Patient Care Team: Midge Minium, MD as PCP - General (Family Medicine) Lafayette Dragon, MD (Inactive) as Consulting Physician (Gastroenterology) Mosetta Anis, MD as Referring Physician (Allergy) Linda Hedges, DO as Consulting Physician (Obstetrics and Gynecology) Sydnee Levans, MD as Consulting Physician (Dermatology) Chucky May, MD as Consulting Physician (Psychiatry)  Indicate any recent Medical Services you may have received from other than [facility] providers in the past year (date may be approximate).     Assessment:   This is a routine wellness examination for Kennetha.  Hearing/Vision screen Vision Screening - Comments:: Annual eye exams wear glasses /contacts   Dietary issues and exercise activities discussed: Current Exercise Habits: Home exercise routine;The patient does not participate in regular exercise at present, Exercise limited by: orthopedic condition(s)   Goals Addressed   None    Depression Screen    08/24/2021   12:57 PM 08/24/2021   12:55 PM 03/13/2021   10:08 AM 08/04/2020    8:55 AM 07/18/2020    1:52 PM 07/14/2019    9:00 AM 04/10/2019    3:02 PM  PHQ 2/9 Scores  PHQ -  2 Score 0 0 2 4 0 0 6  PHQ- 9 Score   5 12  0 6     Fall Risk    08/24/2021   12:57 PM 08/23/2021    1:59 PM 06/12/2021    1:34 PM 03/13/2021   10:08 AM 08/04/2020    8:54 AM  Fall Risk   Falls in the past year? '1 1 1 1 '$ 0  Number falls in past yr: 0 0 1 1 0  Injury with Fall? 1 0 0 0 0  Risk for fall due to :   History of fall(s) No Fall Risks No Fall Risks  Follow up Falls evaluation completed;Education provided  Falls evaluation completed Falls evaluation completed     [place]:  Any stairs in or around the home? No  If so, are there any without handrails? No  Home free of loose throw rugs in walkways, pet beds, electrical cords, etc? Yes  Adequate lighting in your home to reduce risk of falls? Yes   ASSISTIVE DEVICES UTILIZED TO PREVENT FALLS:  Life alert? No  Use of a cane, walker or w/c? No  Grab bars in the bathroom? No  Shower chair or bench in shower? No  Elevated toilet seat or a handicapped toilet? No     Cognitive Function:  Normal cognitive status assessed by telephone conversation  by this Nurse Health Advisor. No abnormalities found.        Immunizations Immunization History  Administered Date(s) Administered   Influenza, High Dose Seasonal PF 11/16/2014, 01/09/2016, 11/16/2016, 11/12/2018, 12/16/2018, 12/15/2019, 03/09/2021   Influenza, Seasonal, Injecte, Preservative Fre 01/07/2012   Influenza,inj,Quad PF,6+ Mos 10/27/2015, 11/27/2017   Influenza-Unspecified 10/07/2014, 11/16/2016, 11/01/2019, 11/09/2020   PFIZER(Purple Top)SARS-COV-2 Vaccination 03/14/2019, 04/04/2019, 11/10/2019, 11/09/2020   Pneumococcal Conjugate-13 11/12/2018   Pneumococcal Polysaccharide-23 12/16/2018, 06/04/2019, 12/15/2019, 03/09/2021   Tdap 05/16/2011   Zoster Recombinat (Shingrix) 06/07/2016, 12/10/2016   Zoster, Live 02/27/2006    TDAP status: Up to date  Flu Vaccine status: Up to date  Pneumococcal vaccine status: Up to date  Covid-19 vaccine status: Completed vaccines  Qualifies  for Shingles Vaccine? Yes   Zostavax completed Yes   Shingrix Completed?: Yes  Screening Tests Health Maintenance  Topic Date Due   INFLUENZA VACCINE  09/05/2021   MAMMOGRAM  09/28/2021   COLONOSCOPY (Pts 45-35yr Insurance coverage will need to be confirmed)  01/05/2024   Pneumonia Vaccine 68 Years old  Completed   DEXA SCAN  Completed   Hepatitis C Screening  Completed   Zoster Vaccines- Shingrix  Completed   HPV VACCINES  Aged Out   TETANUS/TDAP  Discontinued   COVID-19 Vaccine  Discontinued    Health Maintenance  There are no preventive care reminders to display for this patient.  Colorectal cancer screening: Type of screening: Colonoscopy. Completed 01/05/2019. Repeat every 5 years  Mammogram status: Completed 09/28/2020. Repeat every year  Bone Density status: Completed 05/24/2017. Results reflect: Bone density results: OSTEOPENIA. Repeat every 5 years.  Lung Cancer Screening: (Low Dose CT Chest recommended if Age 389-80years, 30 pack-year currently smoking OR have quit w/in 15years.) does not qualify.   Lung Cancer Screening Referral: n/a  Additional Screening:  Hepatitis C Screening: does not qualify;   Vision Screening: Recommended annual ophthalmology exams for early detection of glaucoma and other disorders of the eye. Is the patient up to date with their annual eye exam?  Yes  Who is the provider or what is the  name of the office in which the patient attends annual eye exams? Dr.McFarland  If pt is not established with a provider, would they like to be referred to a provider to establish care? No .   Dental Screening: Recommended annual dental exams for proper oral hygiene  Community Resource Referral / Chronic Care Management: CRR required this visit?  No   CCM required this visit?  No      Plan:     I have personally reviewed and noted the following in the patient's chart:   Medical and social history Use of alcohol, tobacco or illicit drugs   Current medications and supplements including opioid prescriptions.  Functional ability and status Nutritional status Physical activity Advanced directives List of other physicians Hospitalizations, surgeries, and ER visits in previous 12 months Vitals Screenings to include cognitive, depression, and falls Referrals and appointments  In addition, I have reviewed and discussed with patient certain preventive protocols, quality metrics, and best practice recommendations. A written personalized care plan for preventive services as well as general preventive health recommendations were provided to patient.     Randel Pigg, LPN   [id number]/61/[id number]   Nurse Notes: none

## 2021-08-24 NOTE — Patient Instructions (Signed)
[name] , Thank you for taking time to come for your Medicare Wellness Visit. I appreciate your ongoing commitment to your health goals. Please review the following plan we discussed and let me know if I can assist you in the future.   Screening recommendations/referrals: Colonoscopy: 01/05/2019 Mammogram: 09/28/2020 Bone Density: 05/24/2017 Recommended yearly ophthalmology/optometry visit for glaucoma screening and checkup Recommended yearly dental visit for hygiene and checkup  Vaccinations: Influenza vaccine: completed  Pneumococcal vaccine: completed  Tdap vaccine: due  Shingles vaccine: completed     Advanced directives: yes   Conditions/risks identified: none   Next appointment: none    Preventive Care 68 Years and Older, Female Preventive care refers to lifestyle choices and visits with your health care provider that can promote health and wellness. What does preventive care include? A yearly physical exam. This is also called an annual well check. Dental exams once or twice a year. Routine eye exams. Ask your health care provider how often you should have your eyes checked. Personal lifestyle choices, including: Daily care of your teeth and gums. Regular physical activity. Eating a healthy diet. Avoiding tobacco and drug use. Limiting alcohol use. Practicing safe sex. Taking low-dose aspirin every day. Taking vitamin and mineral supplements as recommended by your health care provider. What happens during an annual well check? The services and screenings done by your health care provider during your annual well check will depend on your age, overall health, lifestyle risk factors, and family history of disease. Counseling  Your health care provider may ask you questions about your: Alcohol use. Tobacco use. Drug use. Emotional well-being. Home and relationship well-being. Sexual activity. Eating habits. History of falls. Memory and ability to understand  (cognition). Work and work Statistician. Reproductive health. Screening  You may have the following tests or measurements: Height, weight, and BMI. Blood pressure. Lipid and cholesterol levels. These may be checked every 5 years, or more frequently if you are over 61 years old. Skin check. Lung cancer screening. You may have this screening every year starting at age 68 if you have a 30-pack-year history of smoking and currently smoke or have quit within the past 15 years. Fecal occult blood test (FOBT) of the stool. You may have this test every year starting at age 68. Flexible sigmoidoscopy or colonoscopy. You may have a sigmoidoscopy every 5 years or a colonoscopy every 10 years starting at age 49. Hepatitis C blood test. Hepatitis B blood test. Sexually transmitted disease (STD) testing. Diabetes screening. This is done by checking your blood sugar (glucose) after you have not eaten for a while (fasting). You may have this done every 1-3 years. Bone density scan. This is done to screen for osteoporosis. You may have this done starting at age 68. Mammogram. This may be done every 1-2 years. Talk to your health care provider about how often you should have regular mammograms. Talk with your health care provider about your test results, treatment options, and if necessary, the need for more tests. Vaccines  Your health care provider may recommend certain vaccines, such as: Influenza vaccine. This is recommended every year. Tetanus, diphtheria, and acellular pertussis (Tdap, Td) vaccine. You may need a Td booster every 10 years. Zoster vaccine. You may need this after age 68. Pneumococcal 13-valent conjugate (PCV13) vaccine. One dose is recommended after age 68. Pneumococcal polysaccharide (PPSV23) vaccine. One dose is recommended after age 68. Talk to your health care provider about which screenings and vaccines you need and how often  you need them. This information is not intended to  replace advice given to you by your health care provider. Make sure you discuss any questions you have with your health care provider. Document Released: 02/18/2015 Document Revised: 10/12/2015 Document Reviewed: 11/23/2014 Elsevier Interactive Patient Education  2017 [place] Prevention in the Home Falls can cause injuries. They can happen to people of all ages. There are many things you can do to make your home safe and to help prevent falls. What can I do on the outside of my home? Regularly fix the edges of walkways and driveways and fix any cracks. Remove anything that might make you trip as you walk through a door, such as a raised step or threshold. Trim any bushes or trees on the path to your home. Use bright outdoor lighting. Clear any walking paths of anything that might make someone trip, such as rocks or tools. Regularly check to see if handrails are loose or broken. Make sure that both sides of any steps have handrails. Any raised decks and porches should have guardrails on the edges. Have any leaves, snow, or ice cleared regularly. Use sand or salt on walking paths during winter. Clean up any spills in your garage right away. This includes oil or grease spills. What can I do in the bathroom? Use night lights. Install grab bars by the toilet and in the tub and shower. Do not use towel bars as grab bars. Use non-skid mats or decals in the tub or shower. If you need to sit down in the shower, use a plastic, non-slip stool. Keep the floor dry. Clean up any water that spills on the floor as soon as it happens. Remove soap buildup in the tub or shower regularly. Attach bath mats securely with double-sided non-slip rug tape. Do not have throw rugs and other things on the floor that can make you trip. What can I do in the bedroom? Use night lights. Make sure that you have a light by your bed that is easy to reach. Do not use any sheets or blankets that are too big for  your bed. They should not hang down onto the floor. Have a firm chair that has side arms. You can use this for support while you get dressed. Do not have throw rugs and other things on the floor that can make you trip. What can I do in the kitchen? Clean up any spills right away. Avoid walking on wet floors. Keep items that you use a lot in easy-to-reach places. If you need to reach something above you, use a strong step stool that has a grab bar. Keep electrical cords out of the way. Do not use floor polish or wax that makes floors slippery. If you must use wax, use non-skid floor wax. Do not have throw rugs and other things on the floor that can make you trip. What can I do with my stairs? Do not leave any items on the stairs. Make sure that there are handrails on both sides of the stairs and use them. Fix handrails that are broken or loose. Make sure that handrails are as long as the stairways. Check any carpeting to make sure that it is firmly attached to the stairs. Fix any carpet that is loose or worn. Avoid having throw rugs at the top or bottom of the stairs. If you do have throw rugs, attach them to the floor with carpet tape. Make sure that you have a light  switch at the top of the stairs and the bottom of the stairs. If you do not have them, ask someone to add them for you. What else can I do to help prevent falls? Wear shoes that: Do not have high heels. Have rubber bottoms. Are comfortable and fit you well. Are closed at the toe. Do not wear sandals. If you use a stepladder: Make sure that it is fully opened. Do not climb a closed stepladder. Make sure that both sides of the stepladder are locked into place. Ask someone to hold it for you, if possible. Clearly mark and make sure that you can see: Any grab bars or handrails. First and last steps. Where the edge of each step is. Use tools that help you move around (mobility aids) if they are needed. These  include: Canes. Walkers. Scooters. Crutches. Turn on the lights when you go into a dark area. Replace any light bulbs as soon as they burn out. Set up your furniture so you have a clear path. Avoid moving your furniture around. If any of your floors are uneven, fix them. If there are any pets around you, be aware of where they are. Review your medicines with your doctor. Some medicines can make you feel dizzy. This can increase your chance of falling. Ask your doctor what other things that you can do to help prevent falls. This information is not intended to replace advice given to you by your health care provider. Make sure you discuss any questions you have with your health care provider. Document Released: 11/18/2008 Document Revised: 06/30/2015 Document Reviewed: 02/26/2014 Elsevier Interactive Patient Education  2017 [employer].

## 2021-08-30 DIAGNOSIS — J3081 Allergic rhinitis due to animal (cat) (dog) hair and dander: Secondary | ICD-10-CM | POA: Diagnosis not present

## 2021-08-30 DIAGNOSIS — J301 Allergic rhinitis due to pollen: Secondary | ICD-10-CM | POA: Diagnosis not present

## 2021-08-30 DIAGNOSIS — J3089 Other allergic rhinitis: Secondary | ICD-10-CM | POA: Diagnosis not present

## 2021-09-08 DIAGNOSIS — J301 Allergic rhinitis due to pollen: Secondary | ICD-10-CM | POA: Diagnosis not present

## 2021-09-08 DIAGNOSIS — J3081 Allergic rhinitis due to animal (cat) (dog) hair and dander: Secondary | ICD-10-CM | POA: Diagnosis not present

## 2021-09-08 DIAGNOSIS — J3089 Other allergic rhinitis: Secondary | ICD-10-CM | POA: Diagnosis not present

## 2021-09-11 ENCOUNTER — Other Ambulatory Visit: Payer: Self-pay | Admitting: *Deleted

## 2021-09-11 NOTE — Patient Outreach (Signed)
  Care Coordination   09/11/2021 Name: Marcia Adams MRN: [id number] DOB: 1955-11-04   Care Coordination Outreach Attempts:  An unsuccessful telephone outreach was attempted today to offer the patient information about available care coordination services as a benefit of their health plan.   Follow Up Plan:  Additional outreach attempts will be made to offer the patient care coordination information and services.   Encounter Outcome:  No Answer  Care Coordination Interventions Activated:  No   Care Coordination Interventions:  No, not indicated    Raina Mina, RN Care Management Coordinator [place] Office [phone]

## 2021-09-21 ENCOUNTER — Ambulatory Visit: Payer: Medicare PPO | Admitting: Podiatry

## 2021-09-22 DIAGNOSIS — J301 Allergic rhinitis due to pollen: Secondary | ICD-10-CM | POA: Diagnosis not present

## 2021-09-22 DIAGNOSIS — J3081 Allergic rhinitis due to animal (cat) (dog) hair and dander: Secondary | ICD-10-CM | POA: Diagnosis not present

## 2021-09-22 DIAGNOSIS — J3089 Other allergic rhinitis: Secondary | ICD-10-CM | POA: Diagnosis not present

## 2021-09-27 DIAGNOSIS — J3089 Other allergic rhinitis: Secondary | ICD-10-CM | POA: Diagnosis not present

## 2021-09-27 DIAGNOSIS — J301 Allergic rhinitis due to pollen: Secondary | ICD-10-CM | POA: Diagnosis not present

## 2021-09-27 DIAGNOSIS — M79641 Pain in right hand: Secondary | ICD-10-CM | POA: Diagnosis not present

## 2021-09-27 DIAGNOSIS — J3081 Allergic rhinitis due to animal (cat) (dog) hair and dander: Secondary | ICD-10-CM | POA: Diagnosis not present

## 2021-10-03 DIAGNOSIS — R29890 Loss of height: Secondary | ICD-10-CM | POA: Diagnosis not present

## 2021-10-03 DIAGNOSIS — E039 Hypothyroidism, unspecified: Secondary | ICD-10-CM | POA: Diagnosis not present

## 2021-10-03 DIAGNOSIS — Z124 Encounter for screening for malignant neoplasm of cervix: Secondary | ICD-10-CM | POA: Diagnosis not present

## 2021-10-03 DIAGNOSIS — N958 Other specified menopausal and perimenopausal disorders: Secondary | ICD-10-CM | POA: Diagnosis not present

## 2021-10-03 DIAGNOSIS — Z1231 Encounter for screening mammogram for malignant neoplasm of breast: Secondary | ICD-10-CM | POA: Diagnosis not present

## 2021-10-03 DIAGNOSIS — M8588 Other specified disorders of bone density and structure, other site: Secondary | ICD-10-CM | POA: Diagnosis not present

## 2021-10-03 DIAGNOSIS — Z6834 Body mass index (BMI) 34.0-34.9, adult: Secondary | ICD-10-CM | POA: Diagnosis not present

## 2021-10-03 LAB — HM MAMMOGRAPHY

## 2021-10-03 LAB — HM DEXA SCAN

## 2021-10-03 LAB — HM PAP SMEAR

## 2021-10-04 ENCOUNTER — Encounter: Payer: Self-pay | Admitting: Family Medicine

## 2021-10-12 DIAGNOSIS — J3089 Other allergic rhinitis: Secondary | ICD-10-CM | POA: Diagnosis not present

## 2021-10-12 DIAGNOSIS — J3081 Allergic rhinitis due to animal (cat) (dog) hair and dander: Secondary | ICD-10-CM | POA: Diagnosis not present

## 2021-10-12 DIAGNOSIS — J301 Allergic rhinitis due to pollen: Secondary | ICD-10-CM | POA: Diagnosis not present

## 2021-10-21 ENCOUNTER — Encounter: Payer: Self-pay | Admitting: Family Medicine

## 2021-10-23 DIAGNOSIS — J3089 Other allergic rhinitis: Secondary | ICD-10-CM | POA: Diagnosis not present

## 2021-10-23 DIAGNOSIS — J3081 Allergic rhinitis due to animal (cat) (dog) hair and dander: Secondary | ICD-10-CM | POA: Diagnosis not present

## 2021-10-23 DIAGNOSIS — J301 Allergic rhinitis due to pollen: Secondary | ICD-10-CM | POA: Diagnosis not present

## 2021-10-25 DIAGNOSIS — M1811 Unilateral primary osteoarthritis of first carpometacarpal joint, right hand: Secondary | ICD-10-CM | POA: Diagnosis not present

## 2021-11-02 ENCOUNTER — Encounter: Payer: Self-pay | Admitting: Family Medicine

## 2021-11-08 ENCOUNTER — Telehealth: Payer: Self-pay | Admitting: *Deleted

## 2021-11-08 NOTE — Patient Outreach (Signed)
  Care Coordination   11/08/2021 Name: Marcia Adams MRN: [id number] DOB: 26-Oct-1955   Care Coordination Outreach Attempts:  A second unsuccessful outreach was attempted today to offer the patient with information about available care coordination services as a benefit of their health plan.     Follow Up Plan:  Additional outreach attempts will be made to offer the patient care coordination information and services.   Encounter Outcome:  No Answer  Care Coordination Interventions Activated:  No   Care Coordination Interventions:  No, not indicated    Raina Mina, RN Care Management Coordinator [place] Office [phone]

## 2021-11-11 ENCOUNTER — Encounter: Payer: Self-pay | Admitting: Family Medicine

## 2021-11-13 ENCOUNTER — Encounter: Payer: Self-pay | Admitting: Family Medicine

## 2021-11-21 DIAGNOSIS — J3081 Allergic rhinitis due to animal (cat) (dog) hair and dander: Secondary | ICD-10-CM | POA: Diagnosis not present

## 2021-11-21 DIAGNOSIS — J3089 Other allergic rhinitis: Secondary | ICD-10-CM | POA: Diagnosis not present

## 2021-11-21 DIAGNOSIS — J301 Allergic rhinitis due to pollen: Secondary | ICD-10-CM | POA: Diagnosis not present

## 2021-12-06 DIAGNOSIS — J301 Allergic rhinitis due to pollen: Secondary | ICD-10-CM | POA: Diagnosis not present

## 2021-12-06 DIAGNOSIS — J3081 Allergic rhinitis due to animal (cat) (dog) hair and dander: Secondary | ICD-10-CM | POA: Diagnosis not present

## 2021-12-06 DIAGNOSIS — J3089 Other allergic rhinitis: Secondary | ICD-10-CM | POA: Diagnosis not present

## 2021-12-11 DIAGNOSIS — J3089 Other allergic rhinitis: Secondary | ICD-10-CM | POA: Diagnosis not present

## 2021-12-11 DIAGNOSIS — J3081 Allergic rhinitis due to animal (cat) (dog) hair and dander: Secondary | ICD-10-CM | POA: Diagnosis not present

## 2021-12-11 DIAGNOSIS — J301 Allergic rhinitis due to pollen: Secondary | ICD-10-CM | POA: Diagnosis not present

## 2021-12-18 DIAGNOSIS — J3081 Allergic rhinitis due to animal (cat) (dog) hair and dander: Secondary | ICD-10-CM | POA: Diagnosis not present

## 2021-12-18 DIAGNOSIS — J301 Allergic rhinitis due to pollen: Secondary | ICD-10-CM | POA: Diagnosis not present

## 2021-12-18 DIAGNOSIS — J3089 Other allergic rhinitis: Secondary | ICD-10-CM | POA: Diagnosis not present

## 2021-12-20 ENCOUNTER — Other Ambulatory Visit: Payer: Self-pay

## 2021-12-20 DIAGNOSIS — E038 Other specified hypothyroidism: Secondary | ICD-10-CM

## 2021-12-20 MED ORDER — LEVOTHYROXINE SODIUM 88 MCG PO TABS: ORAL_TABLET | ORAL | 0 refills | Status: DC

## 2022-01-10 DIAGNOSIS — J3081 Allergic rhinitis due to animal (cat) (dog) hair and dander: Secondary | ICD-10-CM | POA: Diagnosis not present

## 2022-01-10 DIAGNOSIS — J301 Allergic rhinitis due to pollen: Secondary | ICD-10-CM | POA: Diagnosis not present

## 2022-01-10 DIAGNOSIS — J3089 Other allergic rhinitis: Secondary | ICD-10-CM | POA: Diagnosis not present

## 2022-01-24 DIAGNOSIS — J301 Allergic rhinitis due to pollen: Secondary | ICD-10-CM | POA: Diagnosis not present

## 2022-01-24 DIAGNOSIS — J3089 Other allergic rhinitis: Secondary | ICD-10-CM | POA: Diagnosis not present

## 2022-01-24 DIAGNOSIS — J3081 Allergic rhinitis due to animal (cat) (dog) hair and dander: Secondary | ICD-10-CM | POA: Diagnosis not present

## 2022-01-31 ENCOUNTER — Other Ambulatory Visit: Payer: Self-pay | Admitting: Allergy

## 2022-01-31 ENCOUNTER — Ambulatory Visit
Admission: RE | Admit: 2022-01-31 | Discharge: 2022-01-31 | Disposition: A | Discharge status: Home / Self Care | Payer: Medicare PPO | Source: Ambulatory Visit | Attending: Allergy | Admitting: Allergy

## 2022-01-31 DIAGNOSIS — R052 Subacute cough: Secondary | ICD-10-CM

## 2022-01-31 DIAGNOSIS — J301 Allergic rhinitis due to pollen: Secondary | ICD-10-CM | POA: Diagnosis not present

## 2022-01-31 DIAGNOSIS — J209 Acute bronchitis, unspecified: Secondary | ICD-10-CM | POA: Diagnosis not present

## 2022-01-31 DIAGNOSIS — R059 Cough, unspecified: Secondary | ICD-10-CM | POA: Diagnosis not present

## 2022-01-31 DIAGNOSIS — J3089 Other allergic rhinitis: Secondary | ICD-10-CM | POA: Diagnosis not present

## 2022-01-31 DIAGNOSIS — K449 Diaphragmatic hernia without obstruction or gangrene: Secondary | ICD-10-CM | POA: Diagnosis not present

## 2022-03-07 DIAGNOSIS — J301 Allergic rhinitis due to pollen: Secondary | ICD-10-CM | POA: Diagnosis not present

## 2022-03-07 DIAGNOSIS — J3089 Other allergic rhinitis: Secondary | ICD-10-CM | POA: Diagnosis not present

## 2022-03-07 DIAGNOSIS — J3081 Allergic rhinitis due to animal (cat) (dog) hair and dander: Secondary | ICD-10-CM | POA: Diagnosis not present

## 2022-03-13 ENCOUNTER — Other Ambulatory Visit: Payer: Self-pay | Admitting: Family Medicine

## 2022-03-13 DIAGNOSIS — E038 Other specified hypothyroidism: Secondary | ICD-10-CM

## 2022-03-15 DIAGNOSIS — J3089 Other allergic rhinitis: Secondary | ICD-10-CM | POA: Diagnosis not present

## 2022-03-15 DIAGNOSIS — J301 Allergic rhinitis due to pollen: Secondary | ICD-10-CM | POA: Diagnosis not present

## 2022-03-20 DIAGNOSIS — J3089 Other allergic rhinitis: Secondary | ICD-10-CM | POA: Diagnosis not present

## 2022-03-20 DIAGNOSIS — J3081 Allergic rhinitis due to animal (cat) (dog) hair and dander: Secondary | ICD-10-CM | POA: Diagnosis not present

## 2022-03-20 DIAGNOSIS — J301 Allergic rhinitis due to pollen: Secondary | ICD-10-CM | POA: Diagnosis not present

## 2022-03-28 DIAGNOSIS — J3081 Allergic rhinitis due to animal (cat) (dog) hair and dander: Secondary | ICD-10-CM | POA: Diagnosis not present

## 2022-03-28 DIAGNOSIS — J3089 Other allergic rhinitis: Secondary | ICD-10-CM | POA: Diagnosis not present

## 2022-03-28 DIAGNOSIS — J301 Allergic rhinitis due to pollen: Secondary | ICD-10-CM | POA: Diagnosis not present

## 2022-04-02 DIAGNOSIS — J301 Allergic rhinitis due to pollen: Secondary | ICD-10-CM | POA: Diagnosis not present

## 2022-04-02 DIAGNOSIS — J3081 Allergic rhinitis due to animal (cat) (dog) hair and dander: Secondary | ICD-10-CM | POA: Diagnosis not present

## 2022-04-02 DIAGNOSIS — J3089 Other allergic rhinitis: Secondary | ICD-10-CM | POA: Diagnosis not present

## 2022-04-17 DIAGNOSIS — J3081 Allergic rhinitis due to animal (cat) (dog) hair and dander: Secondary | ICD-10-CM | POA: Diagnosis not present

## 2022-04-17 DIAGNOSIS — J3089 Other allergic rhinitis: Secondary | ICD-10-CM | POA: Diagnosis not present

## 2022-04-17 DIAGNOSIS — J301 Allergic rhinitis due to pollen: Secondary | ICD-10-CM | POA: Diagnosis not present

## 2022-04-23 ENCOUNTER — Telehealth: Payer: Self-pay | Admitting: Family Medicine

## 2022-04-23 DIAGNOSIS — J3089 Other allergic rhinitis: Secondary | ICD-10-CM | POA: Diagnosis not present

## 2022-04-23 DIAGNOSIS — J3081 Allergic rhinitis due to animal (cat) (dog) hair and dander: Secondary | ICD-10-CM | POA: Diagnosis not present

## 2022-04-23 DIAGNOSIS — Z79899 Other long term (current) drug therapy: Secondary | ICD-10-CM | POA: Diagnosis not present

## 2022-04-23 DIAGNOSIS — L700 Acne vulgaris: Secondary | ICD-10-CM | POA: Diagnosis not present

## 2022-04-23 DIAGNOSIS — J301 Allergic rhinitis due to pollen: Secondary | ICD-10-CM | POA: Diagnosis not present

## 2022-04-23 NOTE — Telephone Encounter (Signed)
Pt is coming in for a phy 05/25/22

## 2022-04-23 NOTE — Telephone Encounter (Signed)
Spoke to the pt and she states she has seen her dermatologist today and states the dermatologist was disappointed that pt has not seen Dr Birdie Riddle for blood work . I am scheduling the pt an apt . Dermatologist prescribes the pt Rx spironolactone .

## 2022-04-23 NOTE — Telephone Encounter (Signed)
Yes.  Pt needs appt to get blood work and other routine care

## 2022-04-23 NOTE — Telephone Encounter (Signed)
Caller name: LOCHLAN TORNETTA  On DPR?: Yes  Call back number: [phone] (mobile)  Provider they see: Midge Minium, MD  Reason for call:Pt had appt with dermatologist today and she monitors her potassium levels. Pt hasn't seen Tabori for blood work in a year. She states dermatologist didn't have current labs to read. advise

## 2022-05-09 DIAGNOSIS — H1045 Other chronic allergic conjunctivitis: Secondary | ICD-10-CM | POA: Diagnosis not present

## 2022-05-25 ENCOUNTER — Encounter: Payer: Medicare PPO | Admitting: Family Medicine

## 2022-05-30 DIAGNOSIS — J3089 Other allergic rhinitis: Secondary | ICD-10-CM | POA: Diagnosis not present

## 2022-05-30 DIAGNOSIS — J301 Allergic rhinitis due to pollen: Secondary | ICD-10-CM | POA: Diagnosis not present

## 2022-05-30 DIAGNOSIS — J3081 Allergic rhinitis due to animal (cat) (dog) hair and dander: Secondary | ICD-10-CM | POA: Diagnosis not present

## 2022-06-06 ENCOUNTER — Telehealth: Payer: Self-pay

## 2022-06-06 NOTE — Patient Outreach (Signed)
  Care Coordination   06/06/2022 Name: Marcia Adams MRN: [id number] DOB: 11-10-1955   Care Coordination Outreach Attempts:  An unsuccessful telephone outreach was attempted today to offer the patient information about available care coordination services.  Follow Up Plan:  Additional outreach attempts will be made to offer the patient care coordination information and services.   Encounter Outcome:  No Answer   Care Coordination Interventions:  No, not indicated    Bevelyn Ngo, BSW, CDP Social Worker, Certified Dementia Practitioner [facility] Care Management  Care Coordination [phone]

## 2022-06-10 ENCOUNTER — Other Ambulatory Visit: Payer: Self-pay | Admitting: Family Medicine

## 2022-06-10 DIAGNOSIS — E038 Other specified hypothyroidism: Secondary | ICD-10-CM

## 2022-06-11 ENCOUNTER — Ambulatory Visit: Payer: Self-pay

## 2022-06-11 NOTE — Patient Outreach (Signed)
  Care Coordination   Initial Visit Note   06/11/2022 Name: Marcia Adams MRN: [id number] DOB: April 22, 1955  Marcia Adams is a 67 y.o. year old female who sees Tabori, Helane Rima, MD for primary care. I spoke with  Marcia Adams by phone today.  What matters to the patients health and wellness today?  No concerns, patient is doing well at this time.    Goals Addressed             This Visit's Progress    COMPLETED: Care Coordination Activities       Care Coordination Interventions: SDoH screening reviewed - no acute resources challenges identified at this time Determined the patient does not have concerns with medication costs and/or adherence at this time Education provided on the role of the care coordination team - no follow up desired at this time Encouraged the patient to contact her primary care providers office as needed         SDOH assessments and interventions completed:  Yes  SDOH Interventions Today    Flowsheet Row Most Recent Value  SDOH Interventions   Food Insecurity Interventions Intervention Not Indicated  Housing Interventions Intervention Not Indicated  Transportation Interventions Intervention Not Indicated        Care Coordination Interventions:  Yes, provided   Interventions Today    Flowsheet Row Most Recent Value  Chronic Disease   Chronic disease during today's visit Hypertension (HTN)  General Interventions   General Interventions Discussed/Reviewed General Interventions Discussed, Doctor Visits  Doctor Visits Discussed/Reviewed Doctor Visits Reviewed  Education Interventions   Education Provided Provided Education  Provided Verbal Education On Other  [educated on the role of the care coordination team]        Follow up plan: No further intervention required.   Encounter Outcome:  Pt. Visit Completed   Bevelyn Ngo, BSW, CDP Social Worker, Certified Dementia Practitioner [facility] Care Management  Care  Coordination [phone]

## 2022-06-11 NOTE — Patient Instructions (Signed)
Visit Information  Thank you for taking time to visit with me today. Please don't hesitate to contact me if I can be of assistance to you.   Following are the goals we discussed today:  -Contact your primary care provider as needed  If you are experiencing a Mental Health or Behavioral Health Crisis or need someone to talk to, please go to [facility] [address], [place] ([phone])  Patient verbalizes understanding of instructions and care plan provided today and agrees to view in MyChart. Active MyChart status and patient understanding of how to access instructions and care plan via MyChart confirmed with patient.     Syrah Daughtrey, BSW, CDP Social Worker, Certified Dementia Practitioner [facility] Care Management  Care Coordination [phone]          

## 2022-06-12 DIAGNOSIS — J3089 Other allergic rhinitis: Secondary | ICD-10-CM | POA: Diagnosis not present

## 2022-06-12 DIAGNOSIS — J3081 Allergic rhinitis due to animal (cat) (dog) hair and dander: Secondary | ICD-10-CM | POA: Diagnosis not present

## 2022-06-12 DIAGNOSIS — J301 Allergic rhinitis due to pollen: Secondary | ICD-10-CM | POA: Diagnosis not present

## 2022-06-12 DIAGNOSIS — R052 Subacute cough: Secondary | ICD-10-CM | POA: Diagnosis not present

## 2022-06-12 DIAGNOSIS — H1045 Other chronic allergic conjunctivitis: Secondary | ICD-10-CM | POA: Diagnosis not present

## 2022-06-19 ENCOUNTER — Telehealth: Payer: Self-pay | Admitting: Family Medicine

## 2022-06-19 NOTE — Telephone Encounter (Signed)
Contacted Marcia Adams to schedule their annual wellness visit. Appointment made for 06/20/2022.  Thank you,  [facility] Support [facility] Direct dial  [phone]

## 2022-06-20 ENCOUNTER — Ambulatory Visit (INDEPENDENT_AMBULATORY_CARE_PROVIDER_SITE_OTHER): Payer: Medicare PPO | Admitting: *Deleted

## 2022-06-20 DIAGNOSIS — Z0000 Encounter for general adult medical examination without abnormal findings: Secondary | ICD-10-CM

## 2022-06-20 NOTE — Progress Notes (Signed)
Subjective:   Marcia Adams is a 67 y.o. female who presents for Medicare Annual (Subsequent) preventive examination.  I connected with  [name] on 06/20/22 by a telephone enabled telemedicine application and verified that I am speaking with the correct person using two identifiers.   I discussed the limitations of evaluation and management by telemedicine. The patient expressed understanding and agreed to proceed.  Patient location: home  Provider location: tele-health-home    Review of Systems           Objective:    Today's Vitals   There is no height or weight on file to calculate BMI.     08/24/2021   12:57 PM 07/18/2020    1:53 PM 07/14/2019    9:02 AM 12/06/2016    5:07 AM 02/23/2014    2:38 PM 12/15/2013    3:31 PM 11/22/2013    8:51 PM  Advanced Directives  Does Patient Have a Medical Advance Directive? Yes Yes Yes No;Yes No No No  Type of Estate agent of [place];Living will Healthcare Power of [place];Living will Healthcare Power of [place];Living will Healthcare Power of [employer];Living will     Does patient want to make changes to medical advance directive?   Yes (MAU/Ambulatory/Procedural Areas - Information given)      Copy of Healthcare Power of Attorney in Chart? No - copy requested No - copy requested No - copy requested Yes     Would patient like information on creating a medical advance directive?     No - patient declined information No - patient declined information Yes - Educational materials given    Current Medications (verified) Outpatient Encounter Medications as of 06/20/2022  Medication Sig   acyclovir (ZOVIRAX) 400 MG tablet    buPROPion HCl ER, XL, 450 MG TB24 SMARTSIG:1 By Mouth   clindamycin (CLINDAGEL) 1 % gel Apply topically daily.    D 1000 25 MCG (1000 UT) capsule SMARTSIG:1 By Mouth   Doxepin HCl 6 MG TABS    EPIPEN 2-PAK 0.3 MG/0.3ML DEVI Inject 0.3 mg into the muscle daily as needed (allergic  reaction).    Estradiol 10 MCG TABS vaginal tablet Place vaginally.   Eszopiclone 3 MG TABS Take 3 mg by mouth at bedtime. Take immediately before bedtime   famotidine (PEPCID) 10 MG tablet Take 10 mg by mouth 2 (two) times daily.   fluticasone (FLONASE) 50 MCG/ACT nasal spray SMARTSIG:1-2 Spray(s) Both Nares Daily   L-Methylfolate 15 MG TABS    levothyroxine (SYNTHROID) 75 MCG tablet Take by mouth.   levothyroxine (SYNTHROID) 88 MCG tablet TAKE 1 TABLET BY MOUTH DAILY   LINZESS 72 MCG capsule Take 72 mcg by mouth every morning.   loratadine (CLARITIN) 10 MG tablet Take 10 mg by mouth at bedtime.   LORazepam (ATIVAN) 1 MG tablet Add'l Sig Add'l Sig oral Add'l Sig   montelukast (SINGULAIR) 10 MG tablet TK 1 T PO QD IN THE EVE   Probiotic Product (PROBIOTIC DAILY PO) Take by mouth.   Probiotic, Lactobacillus, CAPS SMARTSIG:1 By Mouth   simvastatin (ZOCOR) 10 MG tablet Take 1 tablet (10 mg total) by mouth at bedtime.   spironolactone (ALDACTONE) 50 MG tablet Take 50 mg by mouth 2 (two) times daily.    tazarotene (AVAGE) 0.1 % cream Apply topically at bedtime.   vortioxetine HBr (TRINTELLIX) 20 MG TABS tablet Take 20 mg by mouth daily.   [DISCONTINUED] azelastine (ASTELIN) 137 MCG/SPRAY nasal spray One spray in each  nostril twice daily as needed for allergies.    No facility-administered encounter medications on file as of 06/20/2022.    Allergies (verified) Penicillins, Ciprofloxacin, Aripiprazole, Aripiprazole, Escitalopram oxalate, Levocetirizine dihydrochloride, and Enalapril   History: Past Medical History:  Diagnosis Date   Allergic rhinitis    Allergy    Anemia    Anxiety    Depression    Diverticulitis    Esophageal stricture    GERD (gastroesophageal reflux disease)    Heart murmur    mild since birth   Hiatal hernia    Hyperlipidemia    Hypertension    Hypothyroidism    Insomnia    Kidney stones    Menopause    Renal insufficiency    Vitamin D deficiency    Past  Surgical History:  Procedure Laterality Date   FOOT TENDON SURGERY Left    HEMORRHOID SURGERY     MASS EXCISION  06/08/2011   Procedure: MINOR EXCISION OF MASS;  Surgeon: Wyn Forster., MD;  Location: [facility];  Service: Orthopedics;  Laterality: Right;  excisional biopsy dorsal right hand   NASAL SINUS SURGERY  7/99   NISSEN FUNDOPLICATION  7/07   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  Pt not to lye completely flat.   STOMACH SURGERY     UPPER GASTROINTESTINAL ENDOSCOPY     Family History  Problem Relation Age of Onset   COPD Mother    Congestive Heart Failure Mother    Dementia Mother    COPD Father    Congestive Heart Failure Father    Depression Brother    Urolithiasis Brother    Arthritis Maternal Grandmother    Kidney cancer Maternal Grandfather    Eczema Daughter    Colon cancer Neg Hx    Esophageal cancer Neg Hx    Rectal cancer Neg Hx    Stomach cancer Neg Hx    Social History   Socioeconomic History   Marital status: Divorced    Spouse name: Not on file   Number of children: 2   Years of education: Not on file   Highest education level: Not on file  Occupational History   Occupation: Tax adviser: [employer]  Tobacco Use   Smoking status: Never   Smokeless tobacco: Never  Vaping Use   Vaping Use: Never used  Substance and Sexual Activity   Alcohol use: Not Currently    Comment: occasionally   Drug use: No   Sexual activity: Never    Birth control/protection: None  Other Topics Concern   Not on file  Social History Narrative   Not on file   Social Determinants of Health   Financial Resource Strain: Low Risk  (08/24/2021)   Overall Financial Resource Strain (CARDIA)    Difficulty of Paying Living Expenses: Not hard at all  Food Insecurity: No Food Insecurity (06/11/2022)   Hunger Vital Sign    Worried About Running Out of Food in the  Last Year: Never true    Ran Out of Food in the Last Year: Never true  Transportation Needs: No Transportation Needs (06/11/2022)   PRAPARE - Administrator, Civil Service (Medical): No    Lack of Transportation (Non-Medical): No  Physical Activity: Inactive (08/24/2021)   Exercise Vital Sign    Days of Exercise per Week: 0 days    Minutes of  Exercise per Session: 0 min  Stress: No Stress Concern Present (08/24/2021)   [employer] of Occupational Health - Occupational Stress Questionnaire    Feeling of Stress : Not at all  Social Connections: Socially Isolated (08/24/2021)   Social Connection and Isolation Panel [NHANES]    Frequency of Communication with Friends and Family: Three times a week    Frequency of Social Gatherings with Friends and Family: Three times a week    Attends Religious Services: Never    Active Member of Clubs or Organizations: No    Attends Engineer, structural: Never    Marital Status: Divorced    Tobacco Counseling Counseling given: Not Answered   Clinical Intake:  Pre-visit preparation completed: Yes  Pain : No/denies pain     Nutritional Risks: None Diabetes: No  How often do you need to have someone help you when you read instructions, pamphlets, or other written materials from your doctor or pharmacy?: 1 - Never  Diabetic?  no  Interpreter Needed?: No  Information entered by :: Remi Haggard LPN   Activities of Daily Living    08/24/2021    1:01 PM 08/23/2021    1:59 PM  In your present state of health, do you have any difficulty performing the following activities:  Hearing? 0 0  Vision? 0 0  Difficulty concentrating or making decisions? 0 0  Walking or climbing stairs? 0 0  Dressing or bathing? 0 0  Doing errands, shopping? 0 0  Preparing Food and eating ? N N  Using the Toilet? N N  In the past six months, have you accidently leaked urine? N N  Do you have problems with loss of bowel control? N N  Managing  your Medications? N N  Managing your Finances? N N  Housekeeping or managing your Housekeeping? N N    Patient Care Team: Sheliah Hatch, MD as PCP - General (Family Medicine) Hart Carwin, MD (Inactive) as Consulting Physician (Gastroenterology) Sidney Ace, MD as Referring Physician (Allergy) Mitchel Honour, DO as Consulting Physician (Obstetrics and Gynecology) Bufford Buttner, MD as Consulting Physician (Dermatology) Milagros Evener, MD as Consulting Physician (Psychiatry) Alejandro Mulling, RN as [facility] Care Management  Indicate any recent Medical Services you may have received from other than [facility] providers in the past year (date may be approximate).     Assessment:   This is a routine wellness examination for Willabelle.  Hearing/Vision screen No results found.  Dietary issues and exercise activities discussed:     Goals Addressed   None    Depression Screen    08/24/2021   12:57 PM 08/24/2021   12:55 PM 03/13/2021   10:08 AM 08/04/2020    8:55 AM 07/18/2020    1:52 PM 07/14/2019    9:00 AM 04/10/2019    3:02 PM  PHQ 2/9 Scores  PHQ - 2 Score 0 0 2 4 0 0 6  PHQ- 9 Score   5 12  0 6    Fall Risk    08/24/2021   12:57 PM 08/23/2021    1:59 PM 06/12/2021    1:34 PM 03/13/2021   10:08 AM 08/04/2020    8:54 AM  Fall Risk   Falls in the past year? 1 1 1 1  0  Number falls in past yr: 0 0 1 1 0  Injury with Fall? 1 0 0 0 0  Risk for fall due to :   History of fall(s) No Fall  Risks No Fall Risks  Follow up Falls evaluation completed;Education provided  Falls evaluation completed Falls evaluation completed     FALL RISK PREVENTION PERTAINING TO THE HOME:  Any stairs in or around the home? No  If so, are there any without handrails? No  Home free of loose throw rugs in walkways, pet beds, electrical cords, etc? Yes  Adequate lighting in your home to reduce risk of falls? Yes   ASSISTIVE DEVICES UTILIZED TO PREVENT FALLS:  Life alert? No  Use of  a cane, walker or w/c? No  Grab bars in the bathroom? No  Shower chair or bench in shower? No  Elevated toilet seat or a handicapped toilet? No   TIMED UP AND GO:  Was the test performed? No .   Cognitive Function:        Immunizations Immunization History  Administered Date(s) Administered   Influenza Split 10/30/2021   Influenza, High Dose Seasonal PF 11/16/2014, 01/09/2016, 11/16/2016, 11/12/2018, 12/16/2018, 12/15/2019, 03/09/2021   Influenza, Seasonal, Injecte, Preservative Fre 01/07/2012   Influenza,inj,Quad PF,6+ Mos 10/27/2015, 11/27/2017   Influenza-Unspecified 10/07/2014, 11/16/2016, 11/01/2019, 11/09/2020   PFIZER(Purple Top)SARS-COV-2 Vaccination 03/14/2019, 04/04/2019, 11/10/2019, 11/09/2020, 11/03/2021   Pneumococcal Conjugate-13 11/12/2018   Pneumococcal Polysaccharide-23 12/16/2018, 06/04/2019, 12/15/2019, 03/09/2021   Respiratory Syncytial Virus Vaccine,Recomb Aduvanted(Arexvy) 11/10/2021   Td 10/30/2021   Tdap 05/16/2011   Zoster Recombinat (Shingrix) 06/07/2016, 12/10/2016   Zoster, Live 02/27/2006    TDAP status: Up to date  Flu Vaccine status: Up to date  Pneumococcal vaccine status: Up to date  Covid-19 vaccine status: Information provided on how to obtain vaccines.   Qualifies for Shingles Vaccine? No   Zostavax completed Yes   Shingrix Completed?: Yes  Screening Tests Health Maintenance  Topic Date Due   INFLUENZA VACCINE  09/06/2022   MAMMOGRAM  10/04/2022   Medicare Annual Wellness (AWV)  06/20/2023   COLONOSCOPY (Pts 45-29yrs Insurance coverage will need to be confirmed)  01/05/2024   DTaP/Tdap/Td (3 - Td or Tdap) 10/31/2031   Pneumonia Vaccine 64+ Years old  Completed   DEXA SCAN  Completed   Hepatitis C Screening  Completed   Zoster Vaccines- Shingrix  Completed   HPV VACCINES  Aged Out   COVID-19 Vaccine  Discontinued    Health Maintenance  There are no preventive care reminders to display for this patient.  Colorectal  cancer screening: Type of screening: Colonoscopy. Completed 2020. Repeat every 5 years  Mammogram status: Completed 2023. Repeat every year  Bone Density status: Completed 2023. Results reflect: Bone density results: OSTEOPENIA. Repeat every 2 years.  Lung Cancer Screening: (Low Dose CT Chest recommended if Age 3-80 years, 30 pack-year currently smoking OR have quit w/in 15years.) does not qualify.   Lung Cancer Screening Referral:   Additional Screening:  Hepatitis C Screening: does not qualify; Completed 2014  Vision Screening: Recommended annual ophthalmology exams for early detection of glaucoma and other disorders of the eye. Is the patient up to date with their annual eye exam?  Yes  Who is the provider or what is the name of the office in which the patient attends annual eye exams? [facility] If pt is not established with a provider, would they like to be referred to a provider to establish care? No .   Dental Screening: Recommended annual dental exams for proper oral hygiene  Community Resource Referral / Chronic Care Management: CRR required this visit?  No   CCM required this visit?  No  Plan:     I have personally reviewed and noted the following in the patient's chart:   Medical and social history Use of alcohol, tobacco or illicit drugs  Current medications and supplements including opioid prescriptions. Patient is not currently taking opioid prescriptions. Functional ability and status Nutritional status Physical activity Advanced directives List of other physicians Hospitalizations, surgeries, and ER visits in previous 12 months Vitals Screenings to include cognitive, depression, and falls Referrals and appointments  In addition, I have reviewed and discussed with patient certain preventive protocols, quality metrics, and best practice recommendations. A written personalized care plan for preventive services as well as general preventive health  recommendations were provided to patient.     Remi Haggard, LPN   1/61/0960   Nurse Notes:   patient states she sees a therapist for her depression, and the medication that is recommended is not covered, patient states she is dealing with not being on a combination of medications and is still seeing therapist.  She states she has gained a lot of weight.    Other alternatives were given but states none will work for her .   She would like to speak about going to another Gastroenterologist  spoke with patients gave her options, she will discuss at her upcoming appointment.

## 2022-06-20 NOTE — Patient Instructions (Signed)
prev

## 2022-07-05 DIAGNOSIS — J3089 Other allergic rhinitis: Secondary | ICD-10-CM | POA: Diagnosis not present

## 2022-07-05 DIAGNOSIS — J301 Allergic rhinitis due to pollen: Secondary | ICD-10-CM | POA: Diagnosis not present

## 2022-07-05 DIAGNOSIS — J3081 Allergic rhinitis due to animal (cat) (dog) hair and dander: Secondary | ICD-10-CM | POA: Diagnosis not present

## 2022-07-09 DIAGNOSIS — J3089 Other allergic rhinitis: Secondary | ICD-10-CM | POA: Diagnosis not present

## 2022-07-09 DIAGNOSIS — J3081 Allergic rhinitis due to animal (cat) (dog) hair and dander: Secondary | ICD-10-CM | POA: Diagnosis not present

## 2022-07-09 DIAGNOSIS — J301 Allergic rhinitis due to pollen: Secondary | ICD-10-CM | POA: Diagnosis not present

## 2022-07-12 ENCOUNTER — Encounter: Payer: Medicare PPO | Admitting: Family Medicine

## 2022-07-18 DIAGNOSIS — J301 Allergic rhinitis due to pollen: Secondary | ICD-10-CM | POA: Diagnosis not present

## 2022-07-18 DIAGNOSIS — J3081 Allergic rhinitis due to animal (cat) (dog) hair and dander: Secondary | ICD-10-CM | POA: Diagnosis not present

## 2022-07-18 DIAGNOSIS — J3089 Other allergic rhinitis: Secondary | ICD-10-CM | POA: Diagnosis not present

## 2022-07-24 DIAGNOSIS — J3089 Other allergic rhinitis: Secondary | ICD-10-CM | POA: Diagnosis not present

## 2022-07-24 DIAGNOSIS — J3081 Allergic rhinitis due to animal (cat) (dog) hair and dander: Secondary | ICD-10-CM | POA: Diagnosis not present

## 2022-07-24 DIAGNOSIS — J301 Allergic rhinitis due to pollen: Secondary | ICD-10-CM | POA: Diagnosis not present

## 2022-07-27 ENCOUNTER — Other Ambulatory Visit: Payer: Self-pay | Admitting: Family Medicine

## 2022-07-27 DIAGNOSIS — E785 Hyperlipidemia, unspecified: Secondary | ICD-10-CM

## 2022-07-30 ENCOUNTER — Other Ambulatory Visit: Payer: Self-pay

## 2022-07-30 DIAGNOSIS — E785 Hyperlipidemia, unspecified: Secondary | ICD-10-CM

## 2022-07-30 MED ORDER — SIMVASTATIN 10 MG PO TABS
10.0000 mg | ORAL_TABLET | Freq: Every day | ORAL | 0 refills | Status: DC
Start: 2022-07-30 — End: 2022-08-28

## 2022-07-30 NOTE — Telephone Encounter (Signed)
Left pt a VM stating she needs an apt . Last OV was 06/12/21. Sent in a 30 day supply w/ no refills

## 2022-08-01 DIAGNOSIS — J3081 Allergic rhinitis due to animal (cat) (dog) hair and dander: Secondary | ICD-10-CM | POA: Diagnosis not present

## 2022-08-01 DIAGNOSIS — J301 Allergic rhinitis due to pollen: Secondary | ICD-10-CM | POA: Diagnosis not present

## 2022-08-01 DIAGNOSIS — J3089 Other allergic rhinitis: Secondary | ICD-10-CM | POA: Diagnosis not present

## 2022-08-03 ENCOUNTER — Encounter: Payer: Medicare PPO | Admitting: Family Medicine

## 2022-08-07 DIAGNOSIS — J3081 Allergic rhinitis due to animal (cat) (dog) hair and dander: Secondary | ICD-10-CM | POA: Diagnosis not present

## 2022-08-07 DIAGNOSIS — J301 Allergic rhinitis due to pollen: Secondary | ICD-10-CM | POA: Diagnosis not present

## 2022-08-07 DIAGNOSIS — J3089 Other allergic rhinitis: Secondary | ICD-10-CM | POA: Diagnosis not present

## 2022-08-13 DIAGNOSIS — J301 Allergic rhinitis due to pollen: Secondary | ICD-10-CM | POA: Diagnosis not present

## 2022-08-13 DIAGNOSIS — J3089 Other allergic rhinitis: Secondary | ICD-10-CM | POA: Diagnosis not present

## 2022-08-13 DIAGNOSIS — J3081 Allergic rhinitis due to animal (cat) (dog) hair and dander: Secondary | ICD-10-CM | POA: Diagnosis not present

## 2022-08-20 DIAGNOSIS — J3089 Other allergic rhinitis: Secondary | ICD-10-CM | POA: Diagnosis not present

## 2022-08-20 DIAGNOSIS — J3081 Allergic rhinitis due to animal (cat) (dog) hair and dander: Secondary | ICD-10-CM | POA: Diagnosis not present

## 2022-08-20 DIAGNOSIS — J301 Allergic rhinitis due to pollen: Secondary | ICD-10-CM | POA: Diagnosis not present

## 2022-08-27 DIAGNOSIS — J3089 Other allergic rhinitis: Secondary | ICD-10-CM | POA: Diagnosis not present

## 2022-08-27 DIAGNOSIS — J301 Allergic rhinitis due to pollen: Secondary | ICD-10-CM | POA: Diagnosis not present

## 2022-08-27 DIAGNOSIS — J3081 Allergic rhinitis due to animal (cat) (dog) hair and dander: Secondary | ICD-10-CM | POA: Diagnosis not present

## 2022-08-28 ENCOUNTER — Other Ambulatory Visit: Payer: Self-pay | Admitting: Family Medicine

## 2022-08-28 DIAGNOSIS — E785 Hyperlipidemia, unspecified: Secondary | ICD-10-CM

## 2022-08-31 ENCOUNTER — Ambulatory Visit (INDEPENDENT_AMBULATORY_CARE_PROVIDER_SITE_OTHER): Payer: Medicare PPO | Admitting: Family Medicine

## 2022-08-31 ENCOUNTER — Encounter: Payer: Self-pay | Admitting: Family Medicine

## 2022-08-31 VITALS — BP 130/76 | HR 84 | Temp 97.9°F | Ht 64.0 in | Wt 197.8 lb

## 2022-08-31 DIAGNOSIS — E785 Hyperlipidemia, unspecified: Secondary | ICD-10-CM | POA: Diagnosis not present

## 2022-08-31 DIAGNOSIS — F332 Major depressive disorder, recurrent severe without psychotic features: Secondary | ICD-10-CM | POA: Diagnosis not present

## 2022-08-31 DIAGNOSIS — M8580 Other specified disorders of bone density and structure, unspecified site: Secondary | ICD-10-CM

## 2022-08-31 DIAGNOSIS — M67431 Ganglion, right wrist: Secondary | ICD-10-CM | POA: Diagnosis not present

## 2022-08-31 DIAGNOSIS — E038 Other specified hypothyroidism: Secondary | ICD-10-CM

## 2022-08-31 DIAGNOSIS — Z6839 Body mass index (BMI) 39.0-39.9, adult: Secondary | ICD-10-CM | POA: Diagnosis not present

## 2022-08-31 DIAGNOSIS — F339 Major depressive disorder, recurrent, unspecified: Secondary | ICD-10-CM | POA: Insufficient documentation

## 2022-08-31 DIAGNOSIS — E669 Obesity, unspecified: Secondary | ICD-10-CM

## 2022-08-31 DIAGNOSIS — K2100 Gastro-esophageal reflux disease with esophagitis, without bleeding: Secondary | ICD-10-CM | POA: Diagnosis not present

## 2022-08-31 LAB — BASIC METABOLIC PANEL
BUN: 28 mg/dL — ABNORMAL HIGH (ref 6–23)
CO2: 26 mEq/L (ref 19–32)
Calcium: 10.5 mg/dL (ref 8.4–10.5)
Chloride: 100 mEq/L (ref 96–112)
Creatinine, Ser: 1.22 mg/dL — ABNORMAL HIGH (ref 0.40–1.20)
GFR: 45.48 mL/min — ABNORMAL LOW (ref >60.00)
Glucose, Bld: 104 mg/dL — ABNORMAL HIGH (ref 70–99)
Potassium: 4.4 mEq/L (ref 3.5–5.1)
Sodium: 137 mEq/L (ref 135–145)

## 2022-08-31 LAB — CBC WITH DIFFERENTIAL/PLATELET
Basophils Absolute: 0.1 10*3/uL (ref 0.0–0.1)
Basophils Relative: 0.9 % (ref 0.0–3.0)
Eosinophils Absolute: 0.2 10*3/uL (ref 0.0–0.7)
Eosinophils Relative: 2.1 % (ref 0.0–5.0)
HCT: 47.2 % — ABNORMAL HIGH (ref 36.0–46.0)
Hemoglobin: 15.5 g/dL — ABNORMAL HIGH (ref 12.0–15.0)
Lymphocytes Relative: 19.9 % (ref 12.0–46.0)
Lymphs Abs: 1.5 10*3/uL (ref 0.7–4.0)
MCHC: 32.8 g/dL (ref 30.0–36.0)
MCV: 100.8 fl — ABNORMAL HIGH (ref 78.0–100.0)
Monocytes Absolute: 0.9 10*3/uL (ref 0.1–1.0)
Monocytes Relative: 11.4 % (ref 3.0–12.0)
Neutro Abs: 5 10*3/uL (ref 1.4–7.7)
Neutrophils Relative %: 65.7 % (ref 43.0–77.0)
Platelets: 306 10*3/uL (ref 150.0–400.0)
RBC: 4.69 Mil/uL (ref 3.87–5.11)
RDW: 13.2 % (ref 11.5–15.5)
WBC: 7.7 10*3/uL (ref 4.0–10.5)

## 2022-08-31 LAB — LIPID PANEL
Cholesterol: 202 mg/dL — ABNORMAL HIGH (ref 0–200)
HDL: 55.6 mg/dL (ref >39.00)
LDL Cholesterol: 119 mg/dL — ABNORMAL HIGH (ref 0–99)
NonHDL: 146.7
Total CHOL/HDL Ratio: 4
Triglycerides: 138 mg/dL (ref 0.0–149.0)
VLDL: 27.6 mg/dL (ref 0.0–40.0)

## 2022-08-31 LAB — HEPATIC FUNCTION PANEL
ALT: 18 U/L (ref 0–35)
AST: 27 U/L (ref 0–37)
Albumin: 4.4 g/dL (ref 3.5–5.2)
Alkaline Phosphatase: 109 U/L (ref 39–117)
Bilirubin, Direct: 0.2 mg/dL (ref 0.0–0.3)
Total Bilirubin: 0.8 mg/dL (ref 0.2–1.2)
Total Protein: 7.4 g/dL (ref 6.0–8.3)

## 2022-08-31 LAB — VITAMIN D 25 HYDROXY (VIT D DEFICIENCY, FRACTURES): VITD: 38.68 ng/mL (ref 30.00–100.00)

## 2022-08-31 LAB — TSH: TSH: 1.44 u[IU]/mL (ref 0.35–5.50)

## 2022-08-31 MED ORDER — ESOMEPRAZOLE MAGNESIUM 40 MG PO CPDR: 40.0000 mg | DELAYED_RELEASE_CAPSULE | Freq: Every day | ORAL | 1 refills | Status: DC

## 2022-08-31 NOTE — Progress Notes (Unsigned)
   Subjective:    Patient ID: [name], female    DOB: 07-Jan-1956, 67 y.o.   MRN: [id number]  HPI Hyperlipidemia- chronic problem, on Simvastatin 10mg  daily  Hypothyroid- chronic problem, on Levothyroxine daily  Depression- ongoing issue.  Pt reports 'my psycho-pharmacologist has run out of things to prescribe'.  Pt finds that whatever combination of meds she is on tends to stop working.  She was prescribed Auvelity but Medicare won't approve this.  Is thinking about switching her insurance so that this would be covered.  Has talked w/ TMS but didn't feel she would be able to attend daily sessions for 6-8 weeks.  Obesity- pt reports she has gained 'about 50 lbs' since COVID.  Weight is stable when compared to last year.  No regular exercise, very sedentary.    Ganglion cysts- R forearm.  Is causing the tips of her fingers to go numb.  Has seen Dr Merlyn Lot for similar on the L hand.  The numbness started in the last 3 weeks.  GERD- pt reports reflux is worsening.  Is coughing to the point of vomiting.  Has to take 2 tums w/ every meal.  Also on Nexium 20mg  daily.  Pt has known hiatal hernia and is s/p Nissan Fundoplication.  Would like a female provider at [facility] rather than returning to Dr Ewing Schlein.  Osteopenia- noted by GYN.  Was told to take OTC Ca and Vit D.  Has hx of hypercalcemia.  Not sure how much to take.   Review of Systems For ROS see HPI     Objective:   Physical Exam Constitutional:      General: She is not in acute distress.    Appearance: Normal appearance. She is well-developed. She is obese. She is not ill-appearing.  HENT:     Head: Normocephalic and atraumatic.  Eyes:     Conjunctiva/sclera: Conjunctivae normal.     Pupils: Pupils are equal, round, and reactive to light.  Neck:     Thyroid: No thyromegaly.  Cardiovascular:     Rate and Rhythm: Normal rate and regular rhythm.     Pulses: Normal pulses.     Heart sounds: Normal heart sounds. No murmur  heard. Pulmonary:     Effort: Pulmonary effort is normal. No respiratory distress.     Breath sounds: Normal breath sounds.  Abdominal:     General: There is no distension.     Palpations: Abdomen is soft.     Tenderness: There is no abdominal tenderness.  Musculoskeletal:     Cervical back: Normal range of motion and neck supple.     Right lower leg: No edema.     Left lower leg: No edema.  Lymphadenopathy:     Cervical: No cervical adenopathy.  Skin:    General: Skin is warm and dry.  Neurological:     General: No focal deficit present.     Mental Status: She is alert and oriented to person, place, and time.  Psychiatric:        Mood and Affect: Mood normal.        Behavior: Behavior normal.        Thought Content: Thought content normal.           Assessment & Plan:  Ganglion cysts- R wrist.  Currently causing her fingers to go numb.  Has seen Dr Merlyn Lot in the past for similar on the L.  Will re-refer.

## 2022-08-31 NOTE — Patient Instructions (Addendum)
Follow up in 6 months to recheck cholesterol We'll notify you of your lab results and make any changes if needed We'll call you to schedule your GI and Hand appts START the Nexium 40mg  daily Try and get regular physical activity and eat a low carb diet Call with any questions or concerns Stay Safe!  Stay Healthy! Hang in there!

## 2022-09-03 ENCOUNTER — Encounter: Payer: Self-pay | Admitting: Family Medicine

## 2022-09-03 ENCOUNTER — Telehealth: Payer: Self-pay

## 2022-09-03 MED ORDER — SIMVASTATIN 20 MG PO TABS: 20.0000 mg | ORAL_TABLET | Freq: Every day | ORAL | 3 refills | Status: DC

## 2022-09-03 NOTE — Telephone Encounter (Signed)
Left results on pt VM  

## 2022-09-03 NOTE — Telephone Encounter (Signed)
-----   Message from Marcia Adams sent at 09/03/2022  7:27 AM EDT ----- Total cholesterol and LDL (bad cholesterol) have both increased since last year.  Please work on a healthy diet and regular exercise to improve these numbers and continue to take your Simvastatin daily.  Your creatinine and GFR are stable when compared to years past.  Please make sure you are drinking plenty of water.  Remainder of labs are stable and look good

## 2022-09-05 NOTE — Assessment & Plan Note (Signed)
Deteriorated.  Pt is s/p Nissan Fundoplication but reports reflux is worsening and she is coughing to the point of vomiting- which has undone her previous surgery.  Doesn't feel that Dr Ewing Schlein is hearing her and would like to see a female provider at [facility].  Will increase Nexium to 40mg  daily.  Pt expressed understanding and is in agreement w/ plan.

## 2022-09-05 NOTE — Assessment & Plan Note (Signed)
Chronic problem.  Currently on Simvastatin 10mg  daily w/o difficulty.  Check labs.  Adjust meds prn

## 2022-09-05 NOTE — Assessment & Plan Note (Signed)
Deteriorated per pt report.  Sees psychopharmacologist and she reports they have 'run out of things to prescribe'.  She feels that whatever medication or combination of medicines that she is on eventually stops working.  Talked w/ TMS but was unwilling to commit to daily sessions b/c she finds some days she cannot leave the house due to her depression.  Seems to be a catch 22.  Encouraged her to rethink TMS.

## 2022-09-05 NOTE — Assessment & Plan Note (Signed)
Chronic problem.  Currently on Levothyroxine daily.  Asymptomatic w/ exception of fatigue.  Check labs.  Adjust meds prn

## 2022-09-05 NOTE — Assessment & Plan Note (Signed)
Weight is stable when compared to last year but pt reports she has gained 'about 50 lbs' since COVID.  She admits she is very sedentary and doesn't feel that she can incorporate daily exercise.  Encouraged her to try walking.

## 2022-09-10 ENCOUNTER — Other Ambulatory Visit: Payer: Self-pay | Admitting: Family Medicine

## 2022-09-10 ENCOUNTER — Telehealth: Payer: Self-pay | Admitting: Gastroenterology

## 2022-09-10 DIAGNOSIS — J301 Allergic rhinitis due to pollen: Secondary | ICD-10-CM | POA: Diagnosis not present

## 2022-09-10 DIAGNOSIS — E038 Other specified hypothyroidism: Secondary | ICD-10-CM

## 2022-09-10 DIAGNOSIS — J3081 Allergic rhinitis due to animal (cat) (dog) hair and dander: Secondary | ICD-10-CM | POA: Diagnosis not present

## 2022-09-10 DIAGNOSIS — J3089 Other allergic rhinitis: Secondary | ICD-10-CM | POA: Diagnosis not present

## 2022-09-10 NOTE — Telephone Encounter (Signed)
Hi Dr. Barron Alvine,  This patient brought in her records for review as she would like to establish care here with you.  She has seen Dr. Ewing Schlein in the past and he has now retired.  She states she is about halfway through her 5-years for her next colonoscopy but she is currently experiencing really bad reflux.  She was very interested in you specifically because you did the TIF procedure and wanted to discuss that further with you.  I am sending you her records for your review.  Please let me know how you would like to proceed.  Thank you.

## 2022-09-12 ENCOUNTER — Encounter: Payer: Self-pay | Admitting: Gastroenterology

## 2022-09-20 ENCOUNTER — Encounter: Payer: Self-pay | Admitting: Gastroenterology

## 2022-10-01 DIAGNOSIS — J3089 Other allergic rhinitis: Secondary | ICD-10-CM | POA: Diagnosis not present

## 2022-10-01 DIAGNOSIS — J3081 Allergic rhinitis due to animal (cat) (dog) hair and dander: Secondary | ICD-10-CM | POA: Diagnosis not present

## 2022-10-01 DIAGNOSIS — J301 Allergic rhinitis due to pollen: Secondary | ICD-10-CM | POA: Diagnosis not present

## 2022-10-04 DIAGNOSIS — Z6833 Body mass index (BMI) 33.0-33.9, adult: Secondary | ICD-10-CM | POA: Diagnosis not present

## 2022-10-04 DIAGNOSIS — Z124 Encounter for screening for malignant neoplasm of cervix: Secondary | ICD-10-CM | POA: Diagnosis not present

## 2022-10-04 DIAGNOSIS — J45909 Unspecified asthma, uncomplicated: Secondary | ICD-10-CM | POA: Insufficient documentation

## 2022-10-04 DIAGNOSIS — Z1231 Encounter for screening mammogram for malignant neoplasm of breast: Secondary | ICD-10-CM | POA: Diagnosis not present

## 2022-10-04 DIAGNOSIS — R87810 Cervical high risk human papillomavirus (HPV) DNA test positive: Secondary | ICD-10-CM | POA: Diagnosis not present

## 2022-10-05 ENCOUNTER — Other Ambulatory Visit: Payer: Self-pay | Admitting: Family Medicine

## 2022-10-05 DIAGNOSIS — E785 Hyperlipidemia, unspecified: Secondary | ICD-10-CM

## 2022-10-10 ENCOUNTER — Other Ambulatory Visit: Payer: Self-pay | Admitting: Obstetrics & Gynecology

## 2022-10-10 DIAGNOSIS — R928 Other abnormal and inconclusive findings on diagnostic imaging of breast: Secondary | ICD-10-CM

## 2022-10-15 DIAGNOSIS — J3089 Other allergic rhinitis: Secondary | ICD-10-CM | POA: Diagnosis not present

## 2022-10-15 DIAGNOSIS — J3081 Allergic rhinitis due to animal (cat) (dog) hair and dander: Secondary | ICD-10-CM | POA: Diagnosis not present

## 2022-10-15 DIAGNOSIS — J301 Allergic rhinitis due to pollen: Secondary | ICD-10-CM | POA: Diagnosis not present

## 2022-10-17 ENCOUNTER — Ambulatory Visit
Admission: RE | Admit: 2022-10-17 | Discharge: 2022-10-17 | Disposition: A | Discharge status: Home / Self Care | Payer: Medicare PPO | Source: Ambulatory Visit | Attending: Obstetrics & Gynecology | Admitting: Obstetrics & Gynecology

## 2022-10-17 ENCOUNTER — Other Ambulatory Visit: Payer: Self-pay | Admitting: Obstetrics & Gynecology

## 2022-10-17 DIAGNOSIS — R921 Mammographic calcification found on diagnostic imaging of breast: Secondary | ICD-10-CM | POA: Diagnosis not present

## 2022-10-17 DIAGNOSIS — R928 Other abnormal and inconclusive findings on diagnostic imaging of breast: Secondary | ICD-10-CM

## 2022-10-23 ENCOUNTER — Encounter: Payer: Self-pay | Admitting: Family Medicine

## 2022-10-25 ENCOUNTER — Ambulatory Visit
Admission: RE | Admit: 2022-10-25 | Discharge: 2022-10-25 | Disposition: A | Discharge status: Home / Self Care | Payer: Medicare PPO | Source: Ambulatory Visit | Attending: Obstetrics & Gynecology | Admitting: Obstetrics & Gynecology

## 2022-10-25 DIAGNOSIS — R921 Mammographic calcification found on diagnostic imaging of breast: Secondary | ICD-10-CM

## 2022-10-25 DIAGNOSIS — N6489 Other specified disorders of breast: Secondary | ICD-10-CM | POA: Diagnosis not present

## 2022-10-25 HISTORY — PX: BREAST BIOPSY: SHX20

## 2022-10-26 ENCOUNTER — Encounter: Payer: Self-pay | Admitting: Family Medicine

## 2022-10-26 LAB — SURGICAL PATHOLOGY

## 2022-10-26 NOTE — Telephone Encounter (Signed)
Patient wanted you to be aware of Pathology results Benign!

## 2022-10-29 DIAGNOSIS — J3081 Allergic rhinitis due to animal (cat) (dog) hair and dander: Secondary | ICD-10-CM | POA: Diagnosis not present

## 2022-10-29 DIAGNOSIS — J3089 Other allergic rhinitis: Secondary | ICD-10-CM | POA: Diagnosis not present

## 2022-10-29 DIAGNOSIS — J301 Allergic rhinitis due to pollen: Secondary | ICD-10-CM | POA: Diagnosis not present

## 2022-11-09 DIAGNOSIS — G5601 Carpal tunnel syndrome, right upper limb: Secondary | ICD-10-CM | POA: Diagnosis not present

## 2022-11-12 DIAGNOSIS — J3081 Allergic rhinitis due to animal (cat) (dog) hair and dander: Secondary | ICD-10-CM | POA: Diagnosis not present

## 2022-11-12 DIAGNOSIS — J3089 Other allergic rhinitis: Secondary | ICD-10-CM | POA: Diagnosis not present

## 2022-11-12 DIAGNOSIS — J301 Allergic rhinitis due to pollen: Secondary | ICD-10-CM | POA: Diagnosis not present

## 2022-11-26 DIAGNOSIS — J301 Allergic rhinitis due to pollen: Secondary | ICD-10-CM | POA: Diagnosis not present

## 2022-11-26 DIAGNOSIS — J3089 Other allergic rhinitis: Secondary | ICD-10-CM | POA: Diagnosis not present

## 2022-11-26 DIAGNOSIS — J3081 Allergic rhinitis due to animal (cat) (dog) hair and dander: Secondary | ICD-10-CM | POA: Diagnosis not present

## 2022-12-12 DIAGNOSIS — D1721 Benign lipomatous neoplasm of skin and subcutaneous tissue of right arm: Secondary | ICD-10-CM | POA: Diagnosis not present

## 2022-12-12 DIAGNOSIS — G5601 Carpal tunnel syndrome, right upper limb: Secondary | ICD-10-CM | POA: Diagnosis not present

## 2022-12-12 DIAGNOSIS — J3089 Other allergic rhinitis: Secondary | ICD-10-CM | POA: Diagnosis not present

## 2022-12-12 DIAGNOSIS — J3081 Allergic rhinitis due to animal (cat) (dog) hair and dander: Secondary | ICD-10-CM | POA: Diagnosis not present

## 2022-12-12 DIAGNOSIS — G5611 Other lesions of median nerve, right upper limb: Secondary | ICD-10-CM | POA: Diagnosis not present

## 2022-12-12 DIAGNOSIS — J301 Allergic rhinitis due to pollen: Secondary | ICD-10-CM | POA: Diagnosis not present

## 2022-12-13 ENCOUNTER — Encounter: Payer: Self-pay | Admitting: Gastroenterology

## 2022-12-13 ENCOUNTER — Other Ambulatory Visit: Payer: Self-pay | Admitting: Family Medicine

## 2022-12-13 ENCOUNTER — Ambulatory Visit: Payer: Medicare PPO | Admitting: Gastroenterology

## 2022-12-13 VITALS — BP 120/86 | HR 84 | Ht 63.75 in | Wt 194.5 lb

## 2022-12-13 DIAGNOSIS — R1310 Dysphagia, unspecified: Secondary | ICD-10-CM

## 2022-12-13 DIAGNOSIS — Z9889 Other specified postprocedural states: Secondary | ICD-10-CM

## 2022-12-13 DIAGNOSIS — K219 Gastro-esophageal reflux disease without esophagitis: Secondary | ICD-10-CM | POA: Diagnosis not present

## 2022-12-13 DIAGNOSIS — K449 Diaphragmatic hernia without obstruction or gangrene: Secondary | ICD-10-CM | POA: Diagnosis not present

## 2022-12-13 DIAGNOSIS — E038 Other specified hypothyroidism: Secondary | ICD-10-CM

## 2022-12-13 DIAGNOSIS — Z860100 Personal history of colon polyps, unspecified: Secondary | ICD-10-CM

## 2022-12-13 MED ORDER — ESOMEPRAZOLE MAGNESIUM 40 MG PO CPDR: 40.0000 mg | DELAYED_RELEASE_CAPSULE | Freq: Every day | ORAL | 3 refills | Status: DC

## 2022-12-13 NOTE — Progress Notes (Signed)
Chief Complaint: GERD, hiatal hernia   Referring Provider:     Sheliah Hatch, MD   HPI:     Marcia Adams is a 67 y.o. female with medical history as outlined below, referred to the Gastroenterology Clinic for evaluation of GERD and hiatal hernia.  Previously followed with Dr. Ewing Schlein at [facility].  Previous available notes reviewed and notable for the following:  - 05/26/2021: Follow-up appointment by Dr. Ewing Schlein for slow transit constipation, treated with conservative management, senna periodically and other OTC laxatives on demand only.  Plan for follow-up prn [patient reports today that constipation has resolved since starting magnesium supplement] - 10/06/2020: Abdominal x-ray.  Small to moderate colonic stool burden, greatest in the right colon.  No bowel obstruction or free air.  Chronic elevation of the right hemidiaphragm.  At least moderate sized hiatal hernia   - 07/25/1997: 24-hour pH study was positive with significant gastroesophageal reflux  - 11/21/1998: Esophageal Manometry: Normal  - 05/02/2005: EGD (Dr. Juanda Chance): Stricture at 32 cm, 3 cm hiatal hernia, fundic gland polyps  - 02/02/2014: Colonoscopy (Dr. Juanda Chance): 10 mm cecal polyp, pandiverticulosis with associated colonic narrowing, tortuosity, angulation with 31-minute cecal intubation time  - 01/05/2019: Colonoscopy (Dr. Ewing Schlein): Internal hemorrhoids, diverticulosis in the sigmoid colon through hepatic flexure.  Repeat in 5 years   She underwent Nissen fundoplication in 08/2005 with resolution of reflux symptoms.  Prior to the Nissen, longstanding history of reflux and previously treated with Prevacid, Carafate.  Was sleeping upright in a recliner before surgery.  History of diverticulosis with at least 2 episodes of diverticulitis in 2014 then 2015.  She reports having such significant nausea/vomiting with her diverticulitis that she felt hernia recurrence at that time.  CT in 09/2012 with recurrence of  moderate-sized hiatal hernia.  Today, she reports having recurrence of reflux symptoms over the last year or so.  Has been having heartburn, regurgitation, cough, increased throat clearing.  Symptoms particularly bothersome at night, and sleeping with HOB elevated.  Only eats 1 meal/day to try to limit this.  Started OTC Nexium without much change.  PCM then prescribed Nexium 40 mg daily with improvement (but not resolution), but she stopped taking as she thought she was limited to 2 weeks only.  Has also gained weight over last few years.   Occasional dysphagia.  Describes a feeling of food going into "Wrong pipe" with immediate cough with PO intake.  Occurs frequent enough that she tends to avoid eating in front of people and limits to 1 meal/day.   Reviewed labs from 08/2022: H/H 15/47 with MCV 100.8, otherwise normal CBC.  Normal liver enzymes, stable renal function, normal TSH.  No recent abdominal imaging for review.     Latest Ref Rng & Units 08/31/2022    1:11 PM 03/13/2021    9:52 AM 08/04/2020    9:30 AM  CBC  WBC 4.0 - 10.5 K/uL 7.7  9.8  7.5   Hemoglobin 12.0 - 15.0 g/dL 16.1  09.6  04.5   Hematocrit 36.0 - 46.0 % 47.2  42.3  41.7   Platelets 150.0 - 400.0 K/uL 306.0  312.0  279.0       Latest Ref Rng & Units 08/31/2022    1:11 PM 03/13/2021    9:52 AM 08/04/2020    9:30 AM  Hepatic Function  Total Protein 6.0 - 8.3 g/dL 7.4  6.4  6.8   Albumin  3.5 - 5.2 g/dL 4.4  4.0  4.3   AST 0 - 37 U/L 27  12  21    ALT 0 - 35 U/L 18  12  13    Alk Phosphatase 39 - 117 U/L 109  97  122   Total Bilirubin 0.2 - 1.2 mg/dL 0.8  0.6  0.4   Bilirubin, Direct 0.0 - 0.3 mg/dL 0.2  0.1  0.1       Latest Ref Rng & Units 08/31/2022    1:11 PM 03/13/2021    9:52 AM 08/04/2020    9:30 AM  BMP  Glucose 70 - 99 mg/dL 409  86  89   BUN 6 - 23 mg/dL 28  29  26    Creatinine 0.40 - 1.20 mg/dL 8.11  9.14  7.82   Sodium 135 - 145 mEq/L 137  139  136   Potassium 3.5 - 5.1 mEq/L 4.4  4.1  4.3   Chloride 96 -  112 mEq/L 100  102  102   CO2 19 - 32 mEq/L 26  29  22    Calcium 8.4 - 10.5 mg/dL 95.6  9.6  9.5      Past Medical History:  Diagnosis Date   Allergic rhinitis    Allergy    Anemia    Anxiety    Asthma    Depression    Diverticulitis    Esophageal stricture    GERD (gastroesophageal reflux disease)    Heart murmur    mild since birth   Hiatal hernia    Hyperlipidemia    Hypertension    Hypothyroidism    Insomnia    Kidney stones    Menopause    Renal insufficiency    Vitamin D deficiency      Past Surgical History:  Procedure Laterality Date   BREAST BIOPSY Left 10/25/2022   MM LT BREAST BX W LOC DEV 1ST LESION IMAGE BX SPEC STEREO GUIDE 10/25/2022 [facility]   COLONOSCOPY  01/05/2019   [facility]. Dr. Ewing Schlein: Internal hemorrhoids, diverticulosis in the sigmoid colon through hepatic flexure.  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  Pt not to lye completely flat.   STOMACH SURGERY     UPPER GASTROINTESTINAL ENDOSCOPY     Family History  Problem Relation Age of Onset   COPD Mother    Congestive Heart Failure Mother    Dementia Mother    COPD Father    Congestive Heart Failure Father    Depression Brother    Urolithiasis Brother    Arthritis Maternal Grandmother    Kidney cancer Maternal Grandfather    Eczema Daughter    Colon cancer Neg Hx    Esophageal cancer Neg Hx    Rectal cancer Neg Hx    Stomach cancer Neg Hx    Social History   Tobacco Use   Smoking status: Never   Smokeless tobacco: Never  Vaping Use   Vaping status:  Never Used  Substance Use Topics   Alcohol  use: Not Currently    Comment: occasionally   Drug use: No   Current Outpatient Medications  Medication Sig Dispense Refill   acyclovir (ZOVIRAX) 400 MG tablet      Azelastine-Fluticasone 137-50 MCG/ACT SUSP Place into the nose.     cetirizine (ZYRTEC ALLERGY) 10 MG tablet Take 10 mg by mouth at bedtime.     clindamycin (CLINDAGEL) 1 % gel Apply topically daily.      Doxepin HCl 6 MG TABS      Eszopiclone 3 MG TABS Take 3 mg by mouth at bedtime. Take immediately before bedtime     fluticasone (FLONASE) 50 MCG/ACT nasal spray SMARTSIG:1-2 Spray(s) Both Nares Daily     fluticasone-salmeterol (ADVAIR) 250-50 MCG/ACT AEPB Inhale 1 puff into the lungs in the morning and at bedtime.     fluticasone-salmeterol (WIXELA INHUB) 250-50 MCG/ACT AEPB Inhale 1 puff into the lungs as needed.     ipratropium (ATROVENT) 0.03 % nasal spray Place 2 sprays into the nose as needed.     L-Methylfolate 15 MG TABS      levothyroxine (SYNTHROID) 88 MCG tablet TAKE 1 TABLET BY MOUTH DAILY 90 tablet 0   loratadine (CLARITIN) 10 MG tablet Take 10 mg by mouth at bedtime.     Magnesium 400 MG CAPS Take by mouth.     montelukast (SINGULAIR) 10 MG tablet TK 1 T PO QD IN THE EVE  3   simvastatin (ZOCOR) 20 MG tablet Take 1 tablet (20 mg total) by mouth at bedtime. 90 tablet 3   spironolactone (ALDACTONE) 50 MG tablet Take 50 mg by mouth 2 (two) times daily.      tazarotene (AVAGE) 0.1 % cream Apply topically at bedtime.     valACYclovir (VALTREX) 500 MG tablet Take 500 mg by mouth daily.     vortioxetine HBr (TRINTELLIX) 20 MG TABS tablet Take 20 mg by mouth daily.     EPIPEN 2-PAK 0.3 MG/0.3ML DEVI Inject 0.3 mg into the muscle daily as needed (allergic reaction).  (Patient not taking: Reported on 12/13/2022)     esomeprazole (NEXIUM) 40 MG capsule Take 1 capsule (40 mg total) by mouth daily at 12 noon. (Patient not taking: Reported on 12/13/2022) 90 capsule 1   olopatadine  (PATADAY) 0.1 % ophthalmic solution 1 drop 2 (two) times daily. (Patient not taking: Reported on 12/13/2022)     No current facility-administered medications for this visit.   Allergies  Allergen Reactions   Penicillins Itching   Ciprofloxacin Other (See Comments)    Retinal detachment, severe muscle pain; Pt got tendonitis    Aripiprazole Other (See Comments)    Excessive weight gain   Aripiprazole     Excessive weight gain   Escitalopram Oxalate Other (See Comments)    Per pt "my legs hurt"   Levocetirizine Dihydrochloride     Other reaction(s): unknown reaction   Quinolones    Enalapril Rash     Review of Systems: All systems reviewed and negative except where noted in HPI.     Physical Exam:    Wt Readings from Last 3 Encounters:  12/13/22 194 lb 8 oz (88.2 kg)  08/31/22 197 lb 12.8 oz (89.7 kg)  06/12/21 195 lb (88.5 kg)    BP 120/86 (BP Location: Left Arm, Patient Position: Sitting, Cuff Size: Large)   Pulse 84   Ht 5' 3.75" (1.619 m) Comment: height measured without shoes  Wt 194 lb 8 oz (88.2 kg)   BMI 33.65 kg/m  Constitutional:  Pleasant, in no acute distress. Psychiatric: Normal mood and affect. Behavior is normal. Neurological: Alert and oriented to person place and time. Skin: Skin is warm and dry. No rashes noted.   ASSESSMENT AND PLAN;   1) GERD 2) Hiatal hernia 3) History of Nissen fundoplication 67 year old female with longstanding history of GERD, previously responsive to Nissen fundoplication in 08/2005.  Had recurrence of hiatal hernia around 2014 which she attributed to nausea/vomiting during hospital admission for diverticulitis.  Started having recurrence of index reflux symptoms over the last year, to include heartburn, regurgitation, increased throat clearing.  Responded to course of Nexium, but she stopped taking as she thought this was limited to a 2-week duration.  Discussed the pathophysiology of reflux to include hernia recurrence and  slipped Nissen at length today with plan for the following:  - Scheduled for expedited EGD tomorrow to evaluate for grade/severity of hernia, degree of LES laxity, wrap integrity, and presence of erosive esophagitis - Will restart Nexium 40 mg daily - Briefly discussed the role/utility of repeat hernia repair, TIF, toupee fundoplication - Continue antireflux lifestyle/dietary modifications  4) Dysphagia Seems to have a degree of oropharyngeal dysphagia.  Will evaluate for luminal narrowing, stricture, etc. time EGD tomorrow.  If EGD otherwise unremarkable, plan for referral for MBS  5) History of colon polyps - Due for repeat colonoscopy in 12/2023 for ongoing surveillance  The indications, risks, and benefits of EGD were explained to the patient in detail. Risks include but are not limited to bleeding, perforation, adverse reaction to medications, and cardiopulmonary compromise. Sequelae include but are not limited to the possibility of surgery, hospitalization, and mortality. The patient verbalized understanding and wished to proceed. All questions answered, referred to scheduler. Further recommendations pending results of the exam.     Shellia Cleverly, DO, FACG  12/13/2022, 11:05 AM   Sheliah Hatch, MD

## 2022-12-13 NOTE — Patient Instructions (Addendum)
_______________________________________________________  If your blood pressure at your visit was 140/90 or greater, please contact your primary care physician to follow up on this. _______________________________________________________  If you are age 69 or older, your body mass index should be between 23-30. Your Body mass index is 33.65 kg/m. If this is out of the aforementioned range listed, please consider follow up with your Primary Care Provider. ________________________________________________________  The [facility] providers would like to encourage you to use [facility] to communicate with providers for non-urgent requests or questions.  Due to long hold times on the telephone, sending your provider a message by [facility] may be a faster and more efficient way to get a response.  Please allow 48 business hours for a response.  Please remember that this is for non-urgent requests.  _______________________________________________________ We have sent the following medications to your pharmacy for you to pick up at your convenience:  Nexium 40mg  once daily  You have been scheduled for an endoscopy. Please follow written instructions given to you at your visit today.  If you use inhalers (even only as needed), please bring them with you on the day of your procedure.  If you take any of the following medications, they will need to be adjusted prior to your procedure:   DO NOT TAKE 7 DAYS PRIOR TO TEST- Trulicity (dulaglutide) Ozempic, Wegovy (semaglutide) Mounjaro (tirzepatide) Bydureon Bcise (exanatide extended release)  DO NOT TAKE 1 DAY PRIOR TO YOUR TEST Rybelsus (semaglutide) Adlyxin (lixisenatide) Victoza (liraglutide) Byetta (exanatide) ___________________________________________________________________________  It was a pleasure to see you today!  Vito Cirigliano, D.O.

## 2022-12-14 ENCOUNTER — Encounter: Payer: Medicare PPO | Admitting: Gastroenterology

## 2022-12-14 ENCOUNTER — Ambulatory Visit: Payer: Medicare PPO | Admitting: Gastroenterology

## 2022-12-14 ENCOUNTER — Encounter: Payer: Self-pay | Admitting: Gastroenterology

## 2022-12-14 VITALS — BP 135/67 | HR 66 | Temp 97.2°F | Resp 15 | Ht 63.75 in | Wt 194.0 lb

## 2022-12-14 DIAGNOSIS — R1310 Dysphagia, unspecified: Secondary | ICD-10-CM

## 2022-12-14 DIAGNOSIS — E039 Hypothyroidism, unspecified: Secondary | ICD-10-CM | POA: Diagnosis not present

## 2022-12-14 DIAGNOSIS — Z9889 Other specified postprocedural states: Secondary | ICD-10-CM | POA: Diagnosis not present

## 2022-12-14 DIAGNOSIS — I10 Essential (primary) hypertension: Secondary | ICD-10-CM | POA: Diagnosis not present

## 2022-12-14 DIAGNOSIS — K314 Gastric diverticulum: Secondary | ICD-10-CM | POA: Diagnosis not present

## 2022-12-14 DIAGNOSIS — K219 Gastro-esophageal reflux disease without esophagitis: Secondary | ICD-10-CM

## 2022-12-14 DIAGNOSIS — K449 Diaphragmatic hernia without obstruction or gangrene: Secondary | ICD-10-CM | POA: Diagnosis not present

## 2022-12-14 DIAGNOSIS — J45909 Unspecified asthma, uncomplicated: Secondary | ICD-10-CM | POA: Diagnosis not present

## 2022-12-14 DIAGNOSIS — F32A Depression, unspecified: Secondary | ICD-10-CM | POA: Diagnosis not present

## 2022-12-14 DIAGNOSIS — F419 Anxiety disorder, unspecified: Secondary | ICD-10-CM | POA: Diagnosis not present

## 2022-12-14 MED ORDER — SODIUM CHLORIDE 0.9 % IV SOLN: 500.0000 mL | Freq: Once | INTRAVENOUS | Status: DC

## 2022-12-14 NOTE — Progress Notes (Signed)
GASTROENTEROLOGY PROCEDURE H&P NOTE   Primary Care Physician: Sheliah Hatch, MD    Reason for Procedure:   GERD, hiatal hernia, dysphagia  Plan:    EGD  Patient is appropriate for endoscopic procedure(s) in the ambulatory ([facility]) setting.  The nature of the procedure, as well as the risks, benefits, and alternatives were carefully and thoroughly reviewed with the patient. Ample time for discussion and questions allowed. The patient understood, was satisfied, and agreed to proceed.     HPI: Marcia Adams is a 67 y.o. female who presents for EGD for evaluation of GERD, hiatal hernia, dysphagia .  Patient was most recently seen in the Gastroenterology Clinic on 12/13/2022 by me.  No interval change in medical history since that appointment. Please refer to that note for full details regarding GI history and clinical presentation.   Past Medical History:  Diagnosis Date   Allergic rhinitis    Allergy    Anemia    Anxiety    Asthma    Depression    Diverticulitis    Esophageal stricture    GERD (gastroesophageal reflux disease)    Heart murmur    mild since birth   Hiatal hernia    Hyperlipidemia    Hypertension    Hypothyroidism    Insomnia    Kidney stones    Menopause    Renal insufficiency    Vitamin D deficiency     Past Surgical History:  Procedure Laterality Date   BREAST BIOPSY Left 10/25/2022   MM LT BREAST BX W LOC DEV 1ST LESION IMAGE BX SPEC STEREO GUIDE 10/25/2022 [facility]   COLONOSCOPY  01/05/2019   [facility]. Dr. Ewing Schlein: Internal hemorrhoids, diverticulosis in the sigmoid colon through hepatic flexure.  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  Pt not to lye completely flat.   STOMACH SURGERY     UPPER GASTROINTESTINAL ENDOSCOPY      Prior to Admission medications   Medication Sig Start Date End Date Taking? Authorizing Provider  acyclovir (ZOVIRAX) 400 MG tablet  05/09/21  Yes [provider]  cetirizine (ZYRTEC ALLERGY) 10 MG tablet Take 10 mg by mouth at bedtime.   Yes [provider]  clindamycin (CLINDAGEL) 1 % gel Apply topically daily.    Yes [provider]  Doxepin HCl 6 MG TABS  06/06/20  Yes [provider]  Eszopiclone 3 MG TABS Take 3 mg by mouth at bedtime. Take immediately before bedtime   Yes [provider]  fluticasone (FLONASE) 50 MCG/ACT nasal spray SMARTSIG:1-2 Spray(s) Both Nares Daily 08/15/21  Yes [provider]  levothyroxine (SYNTHROID) 88 MCG tablet TAKE 1 TABLET BY MOUTH DAILY 12/13/22  Yes Sheliah Hatch, MD  loratadine (CLARITIN) 10 MG tablet Take 10 mg by mouth at bedtime.   Yes [provider]  Magnesium 400 MG CAPS Take by mouth.   Yes [provider]  montelukast (SINGULAIR) 10 MG tablet TK 1 T PO QD IN THE EVE 07/22/16  Yes [provider]  simvastatin (ZOCOR) 20 MG tablet Take 1 tablet (20 mg total) by mouth at bedtime. 09/03/22  Yes Sheliah Hatch, MD  spironolactone (ALDACTONE) 50 MG tablet Take 50 mg by mouth 2 (two) times daily.    Yes [provider]  tazarotene (AVAGE) 0.1 % cream Apply topically at bedtime.   Yes [provider]  valACYclovir (VALTREX) 500 MG tablet Take 500 mg by mouth daily.   Yes [provider]  vortioxetine HBr (TRINTELLIX) 20 MG TABS tablet Take 20 mg by mouth daily.   Yes [provider]  EPIPEN 2-PAK 0.3 MG/0.3ML DEVI Inject 0.3 mg into the muscle daily as needed (allergic reaction).  Patient not taking: Reported on  12/13/2022 06/02/11   [provider]  esomeprazole (NEXIUM) 40 MG capsule Take 1 capsule (40 mg total) by mouth daily at 12 noon. 12/13/22   Makylee Sanborn V, DO  fluticasone-salmeterol (ADVAIR) 250-50 MCG/ACT AEPB Inhale 1 puff into the lungs in the morning and at bedtime.    [provider]  azelastine (ASTELIN) 137 MCG/SPRAY nasal spray One spray in each nostril twice daily as needed for allergies.   04/24/11  [provider]    Current Outpatient Medications  Medication Sig Dispense Refill   acyclovir (ZOVIRAX) 400 MG tablet      cetirizine (ZYRTEC ALLERGY) 10 MG tablet Take 10 mg by mouth at bedtime.     clindamycin (CLINDAGEL) 1 % gel Apply topically daily.      Doxepin HCl 6 MG TABS      Eszopiclone 3 MG TABS Take 3 mg by mouth at bedtime. Take immediately before bedtime     fluticasone (FLONASE) 50 MCG/ACT nasal spray SMARTSIG:1-2 Spray(s) Both Nares Daily     levothyroxine (SYNTHROID) 88 MCG tablet TAKE 1 TABLET BY MOUTH DAILY 90 tablet 0   loratadine (CLARITIN) 10 MG tablet Take 10 mg by mouth at bedtime.     Magnesium 400 MG CAPS Take by mouth.     montelukast (SINGULAIR) 10 MG tablet TK 1 T PO QD IN THE EVE  3   simvastatin (ZOCOR) 20 MG tablet Take 1 tablet (20 mg total) by mouth at bedtime. 90 tablet 3   spironolactone (ALDACTONE) 50 MG tablet Take 50 mg by mouth 2 (two) times daily.      tazarotene (AVAGE) 0.1 % cream Apply topically at bedtime.     valACYclovir (VALTREX) 500 MG tablet Take 500 mg by mouth daily.     vortioxetine HBr (TRINTELLIX) 20 MG TABS tablet Take 20 mg by mouth daily.     EPIPEN 2-PAK 0.3 MG/0.3ML DEVI Inject 0.3 mg into the muscle daily as needed (allergic reaction).  (Patient not taking: Reported on 12/13/2022)     esomeprazole (NEXIUM) 40 MG capsule Take 1 capsule (40 mg total) by mouth daily at 12 noon. 90 capsule 3   fluticasone-salmeterol (ADVAIR) 250-50 MCG/ACT AEPB Inhale 1 puff into the lungs in the morning and at  bedtime.     Current Facility-Administered Medications  Medication Dose Route Frequency Provider Last Rate Last Admin   0.9 %  sodium chloride infusion  500 mL Intravenous Once Averianna Brugger V, DO        Allergies as of 12/14/2022 - Review Complete 12/14/2022  Allergen Reaction Noted   Ciprofloxacin Other (See Comments) 12/07/2013   Penicillins Itching 12/21/2019   Aripiprazole Other (See Comments) 06/15/2010   Aripiprazole  06/15/2010   Escitalopram oxalate Other (See Comments)  04/24/2011   Levocetirizine dihydrochloride  05/25/2021   Quinolones  12/13/2022   Enalapril Rash 06/15/2010    Family History  Problem Relation Age of Onset   COPD Mother    Congestive Heart Failure Mother    Dementia Mother    COPD Father    Congestive Heart Failure Father    Depression Brother    Urolithiasis Brother    Arthritis Maternal Grandmother    Kidney cancer Maternal Grandfather    Eczema Daughter    Colon cancer Neg Hx    Esophageal cancer Neg Hx    Rectal cancer Neg Hx    Stomach cancer Neg Hx     Social History   Socioeconomic History   Marital status: Divorced    Spouse name: Not on file   Number of children: 2   Years of education: Not on file   Highest education level: Not on file  Occupational History   Occupation: Tax adviser: [employer]  Tobacco Use   Smoking status: Never   Smokeless tobacco: Never  Vaping Use   Vaping status: Never Used  Substance and Sexual Activity   Alcohol use: Not Currently    Comment: occasionally   Drug use: No   Sexual activity: Not Currently    Birth control/protection: None  Other Topics Concern   Not on file  Social History Narrative   Not on file   Social Determinants of Health   Financial Resource Strain: Low Risk  (06/20/2022)   Overall Financial Resource Strain (CARDIA)    Difficulty of Paying Living Expenses: Not hard at all  Food Insecurity: No Food Insecurity (06/20/2022)   Hunger  Vital Sign    Worried About Running Out of Food in the Last Year: Never true    Ran Out of Food in the Last Year: Never true  Transportation Needs: No Transportation Needs (06/20/2022)   PRAPARE - Administrator, Civil Service (Medical): No    Lack of Transportation (Non-Medical): No  Physical Activity: Inactive (06/20/2022)   Exercise Vital Sign    Days of Exercise per Week: 0 days    Minutes of Exercise per Session: 0 min  Stress: No Stress Concern Present (06/20/2022)   [employer] of Occupational Health - Occupational Stress Questionnaire    Feeling of Stress : Not at all  Social Connections: Socially Isolated (06/20/2022)   Social Connection and Isolation Panel [NHANES]    Frequency of Communication with Friends and Family: More than three times a week    Frequency of Social Gatherings with Friends and Family: Once a week    Attends Religious Services: Never    Database administrator or Organizations: No    Attends Banker Meetings: Never    Marital Status: Divorced  Catering manager Violence: Not At Risk (06/20/2022)   Humiliation, Afraid, Rape, and Kick questionnaire    Fear of Current or Ex-Partner: No    Emotionally Abused: No    Physically Abused: No    Sexually Abused: No    Physical Exam: Vital signs in last 24 hours: @BP  (!) 142/86   Pulse 88   Temp (!) 97.2 F (36.2 C) (Temporal)   Ht 5' 3.75" (1.619 m)   Wt 194 lb (88 kg)   SpO2 94%   BMI 33.56 kg/m  GEN: NAD EYE: Sclerae anicteric ENT: MMM CV: Non-tachycardic Pulm: CTA b/l GI: Soft, NT/ND NEURO:  Alert & Oriented x 3  Doristine Locks, DO  Gastroenterology   12/14/2022 9:20 AM

## 2022-12-14 NOTE — Op Note (Signed)
[facility] Patient Name: Marcia Adams Procedure Date: 12/14/2022 9:22 AM MRN: [id number] Endoscopist: Doristine Locks , MD, [id number] Age: 67 Referring MD:  Date of Birth: 02/10/1955 Gender: Female Account #: [id number] Procedure:                Upper GI endoscopy Indications:              Dysphagia, Heartburn, Suspected esophageal reflux,                            Regurgitation Medicines:                Monitored Anesthesia Care Procedure:                Pre-Anesthesia Assessment:                           - Prior to the procedure, a History and Physical                            was performed, and patient medications and                            allergies were reviewed. The patient's tolerance of                            previous anesthesia was also reviewed. The risks                            and benefits of the procedure and the sedation                            options and risks were discussed with the patient.                            All questions were answered, and informed consent                            was obtained. Prior Anticoagulants: The patient has                            taken no anticoagulant or antiplatelet agents. ASA                            Grade Assessment: II - A patient with mild systemic                            disease. After reviewing the risks and benefits,                            the patient was deemed in satisfactory condition to                            undergo the procedure.  After obtaining informed consent, the endoscope was                            passed under direct vision. Throughout the                            procedure, the patient's blood pressure, pulse, and                            oxygen saturations were monitored continuously. The                            GIF [id number] #[id number] was introduced through the                            mouth, and advanced to the second part  of duodenum.                            The upper GI endoscopy was accomplished without                            difficulty. The patient tolerated the procedure                            well. Scope In: Scope Out: Findings:                 The middle third of the esophagus and lower third                            of the esophagus were significantly tortuous.                           The Z-line was regular and was found 31 cm from the                            incisors.                           A 5 cm hiatal hernia was present.                           Evidence of a Nissen fundoplication was found in                            the cardia. The wrap appeared not intact. This was                            traversed.                           The gastric body, incisura and gastric antrum were                            normal.  A large non-bleeding diverticulum was found in the                            second portion of the duodenum.                           Normal mucosa was found in the duodenal bulb, in                            the first portion of the duodenum and in the second                            portion of the duodenum. Complications:            No immediate complications. Estimated Blood Loss:     Estimated blood loss: none. Impression:               - Tortuous esophagus.                           - Z-line regular, 31 cm from the incisors.                           - 5 cm hiatal hernia.                           - A Nissen fundoplication was found. The wrap                            appears not intact.                           - Normal gastric body, incisura and antrum.                           - Non-bleeding duodenal diverticulum.                           - Normal mucosa was found in the duodenal bulb, in                            the first portion of the duodenum and in the second                            portion of the  duodenum.                           - No specimens collected. Recommendation:           - Patient has a contact number available for                            emergencies. The signs and symptoms of potential                            delayed complications were discussed with the  patient. Return to normal activities tomorrow.                            Written discharge instructions were provided to the                            patient.                           - Resume previous diet.                           - Continue present medications, including Nexium 40                            mg daily.                           - Refer to a surgeon at appointment to be                            scheduled. Will place that referral.                           - Perform a modified barium swallow at appointment                            to be scheduled. Doristine Locks, MD 12/14/2022 9:40:42 AM

## 2022-12-14 NOTE — Progress Notes (Signed)
To pacu, VSS. Report to RN.tb 

## 2022-12-14 NOTE — Progress Notes (Signed)
Pt's states no medical or surgical changes since previsit or office visit. 

## 2022-12-14 NOTE — Patient Instructions (Signed)
**  Handout given on Diverticulosis**  YOU HAD AN ENDOSCOPIC PROCEDURE TODAY AT THE [facility]:   Refer to the procedure report that was given to you for any specific questions about what was found during the examination.  If the procedure report does not answer your questions, please call your gastroenterologist to clarify.  If you requested that your care partner not be given the details of your procedure findings, then the procedure report has been included in a sealed envelope for you to review at your convenience later.  YOU SHOULD EXPECT: Some feelings of bloating in the abdomen. Passage of more gas than usual.  Walking can help get rid of the air that was put into your GI tract during the procedure and reduce the bloating. If you had a lower endoscopy (such as a colonoscopy or flexible sigmoidoscopy) you may notice spotting of blood in your stool or on the toilet paper. If you underwent a bowel prep for your procedure, you may not have a normal bowel movement for a few days.  Please Note:  You might notice some irritation and congestion in your nose or some drainage.  This is from the oxygen used during your procedure.  There is no need for concern and it should clear up in a day or so.  SYMPTOMS TO REPORT IMMEDIATELY:  Following upper endoscopy (EGD)  Vomiting of blood or coffee ground material  New chest pain or pain under the shoulder blades  Painful or persistently difficult swallowing  New shortness of breath  Fever of 100F or higher  Black, tarry-looking stools  For urgent or emergent issues, a gastroenterologist can be reached at any hour by calling [phone]. Do not use MyChart messaging for urgent concerns.    DIET:  We do recommend a small meal at first, but then you may proceed to your regular diet.  Drink plenty of fluids but you should avoid alcoholic beverages for 24 hours.  ACTIVITY:  You should plan to take it easy for the rest of today and you should  NOT DRIVE or use heavy machinery until tomorrow (because of the sedation medicines used during the test).    FOLLOW UP: Our staff will call the number listed on your records the next business day following your procedure.  We will call around 7:15- 8:00 am to check on you and address any questions or concerns that you may have regarding the information given to you following your procedure. If we do not reach you, we will leave a message.     If any biopsies were taken you will be contacted by phone or by letter within the next 1-3 weeks.  Please call us at [phone] if you have not heard about the biopsies in 3 weeks.    SIGNATURES/CONFIDENTIALITY: You and/or your care partner have signed paperwork which will be entered into your electronic medical record.  These signatures attest to the fact that that the information above on your After Visit Summary has been reviewed and is understood.  Full responsibility of the confidentiality of this discharge information lies with you and/or your care-partner.

## 2022-12-17 ENCOUNTER — Telehealth: Payer: Self-pay

## 2022-12-17 NOTE — Telephone Encounter (Signed)
Attempted f/u call. No answer, left VM. 

## 2022-12-19 ENCOUNTER — Other Ambulatory Visit (HOSPITAL_COMMUNITY): Payer: Self-pay | Admitting: Family Medicine

## 2022-12-19 ENCOUNTER — Other Ambulatory Visit: Payer: Self-pay

## 2022-12-19 DIAGNOSIS — R1310 Dysphagia, unspecified: Secondary | ICD-10-CM

## 2022-12-19 DIAGNOSIS — R1312 Dysphagia, oropharyngeal phase: Secondary | ICD-10-CM

## 2022-12-24 DIAGNOSIS — J3081 Allergic rhinitis due to animal (cat) (dog) hair and dander: Secondary | ICD-10-CM | POA: Diagnosis not present

## 2022-12-24 DIAGNOSIS — J301 Allergic rhinitis due to pollen: Secondary | ICD-10-CM | POA: Diagnosis not present

## 2022-12-24 DIAGNOSIS — J3089 Other allergic rhinitis: Secondary | ICD-10-CM | POA: Diagnosis not present

## 2023-01-01 ENCOUNTER — Ambulatory Visit: Payer: Self-pay | Admitting: Surgery

## 2023-01-01 DIAGNOSIS — K449 Diaphragmatic hernia without obstruction or gangrene: Secondary | ICD-10-CM | POA: Diagnosis not present

## 2023-01-01 NOTE — H&P (Signed)
Marcia Adams [id number]   Referring Provider:  Doristine Locks, DO   Subjective   Chief Complaint: New Consultation ( need for HHR and redo fundoplication )     History of Present Illness:    Very pleasant [phone] woman with history of anemia, anxiety, asthma, depression, diverticulitis, esophageal stricture, slow transit constipation, GERD, recurrent hiatal hernia, HTN/HLD, hypothyroidism, insomnia, kidney stones, renal insufficiency, vitamin D deficiency and prior abdominal surgical history of HHR.   She had what sounds like terrible reflux and manage this medically/with lifestyle and positioning for many years.  She underwent a laparoscopic hiatal hernia repair with Nissen in 2007 and felt like a new person with complete resolution of her symptoms.  She states she lost about 80 pounds in the first month after surgery due to the liquid diet, but regained some of this after she got back to regular food.  She was doing quite well until 2014 when she was admitted with diverticulitis and had about 11 hours of intractable nausea and vomiting.  After this her reflux symptoms recurred and she was found to have recurrence of her hernia.  The reflux had been manageable until about last year.  She states she gained about 50 pounds throughout the course of the pandemic which she understands did not help.  Her symptoms are partially controlled with Nexium now, but she still has breakthrough symptoms and often has to take Tums with meals.  She now only eats once a day between 1:30 and 4:30 PM, otherwise her symptoms are pretty much unbearable.  Still sleeps partially propped up.  In addition to the heartburn, she has issues with sinuses and coughing, which is so forceful now that it does lead to occasional urinary incontinence.  She has been evaluated by her urologist and told that there is no dysfunction with her urinary tract.  She is followed by Dr. Barron Alvine (previously Dr. Ewing Schlein at [place]) and  she was hopeful that she would be candidate for TIF.  She had upper endoscopy on 11/8, findings include a loose Nissen, recurrence of 5 cm hiatal hernia, presbyesophagus, no esophagitis. She has an UGI scheduled for 12/3   Prior GI notes:  "- 05/26/2021: Follow-up appointment by Dr. Ewing Schlein for slow transit constipation, treated with conservative management, senna periodically and other OTC laxatives on demand only.  Plan for follow-up prn [patient reports today that constipation has resolved since starting magnesium supplement] - 10/06/2020: Abdominal x-ray.  Small to moderate colonic stool burden, greatest in the right colon.  No bowel obstruction or free air.  Chronic elevation of the right hemidiaphragm.  At least moderate sized hiatal hernia    - 07/25/1997: 24-hour pH study was positive with significant gastroesophageal reflux  - 11/21/1998: Esophageal Manometry: Normal  - 05/02/2005: EGD (Dr. Juanda Chance): Stricture at 32 cm, 3 cm hiatal hernia, fundic gland polyps  - 02/02/2014: Colonoscopy (Dr. Juanda Chance): 10 mm cecal polyp, pandiverticulosis with associated colonic narrowing, tortuosity, angulation with 31-minute cecal intubation time  - 01/05/2019: Colonoscopy (Dr. Ewing Schlein): Internal hemorrhoids, diverticulosis in the sigmoid colon through hepatic flexure.  Repeat in 5 years    She underwent Nissen fundoplication in 08/2005 with resolution of reflux symptoms.  Prior to the Nissen, longstanding history of reflux and previously treated with Prevacid, Carafate.  Was sleeping upright in a recliner before surgery.  History of diverticulosis with at least 2 episodes of diverticulitis in 2014 then 2015.  She reports having such significant nausea/vomiting with her diverticulitis that she felt hernia  recurrence at that time.  CT in 09/2012 with recurrence of moderate-sized hiatal hernia.   Today, she reports having recurrence of reflux symptoms over the last year or so.  Has been having heartburn, regurgitation,  cough, increased throat clearing.  Symptoms particularly bothersome at night, and sleeping with HOB elevated.  Only eats 1 meal/day to try to limit this.  Started OTC Nexium without much change.  PCM then prescribed Nexium 40 mg daily with improvement (but not resolution), but she stopped taking as she thought she was limited to 2 weeks only.  Has also gained weight over last few years.    Occasional dysphagia.  Describes a feeling of food going into "Wrong pipe" with immediate cough with PO intake.  Occurs frequent enough that she tends to avoid eating in front of people and limits to 1 meal/day.     Reviewed labs from 08/2022: H/H 15/47 with MCV 100.8, otherwise normal CBC.  Normal liver enzymes, stable renal function, normal TSH.  No recent abdominal imaging for review.  1) GERD 2) Hiatal hernia 3) History of Nissen fundoplication 67 year old female with longstanding history of GERD, previously responsive to Nissen fundoplication in 08/2005.  Had recurrence of hiatal hernia around 2014 which she attributed to nausea/vomiting during hospital admission for diverticulitis.  Started having recurrence of index reflux symptoms over the last year, to include heartburn, regurgitation, increased throat clearing.  Responded to course of Nexium, but she stopped taking as she thought this was limited to a 2-week duration.   Discussed the pathophysiology of reflux to include hernia recurrence and slipped Nissen at length today with plan for the following:   - Scheduled for expedited EGD tomorrow to evaluate for grade/severity of hernia, degree of LES laxity, wrap integrity, and presence of erosive esophagitis - Will restart Nexium 40 mg daily - Briefly discussed the role/utility of repeat hernia repair, TIF, toupee fundoplication - Continue antireflux lifestyle/dietary modifications   4) Dysphagia Seems to have a degree of oropharyngeal dysphagia.  Will evaluate for luminal narrowing, stricture, etc. time  EGD tomorrow.  If EGD otherwise unremarkable, plan for referral for MBS   5) History of colon polyps - Due for repeat colonoscopy in 12/2023 for ongoing surveillance"  Review of Systems: A complete review of systems was obtained from the patient.  I have reviewed this information and discussed as appropriate with the patient.  See HPI as well for other ROS.   Medical History: No past medical history on file.  There is no problem list on file for this patient.   Past Surgical History:  Procedure Laterality Date   FOOT SURGERY     FUNCTIONAL ENDOSCOPIC SINUS SURGERY     PNEUMATIC RETINOPATHY     SINUS FUNDIPLICATION       Allergies  Allergen Reactions   Ciprofloxacin Other (See Comments)    TENDINITIS-BOTH SHOULDERS DETACHED RETINA   Penicillin Itching and Rash    Current Outpatient Medications on File Prior to Visit  Medication Sig Dispense Refill   azelastine-fluticasone 137-50 mcg/spray Spry Place 1 tablet into one nostril     cetirizine (ZYRTEC) 10 MG tablet Take 10 mg by mouth at bedtime     doxepin 6 mg Tab      esomeprazole (NEXIUM) 40 MG DR capsule Take 40 mg by mouth     eszopiclone (LUNESTA) 3 mg tablet Take 3 mg by mouth at bedtime as needed     levomefolate-algal oil (DEPLIN, ALGAL OIL,) 15-90.314 mg Cap  levothyroxine (SYNTHROID) 88 MCG tablet Take 1 tablet by mouth once daily     loratadine (CLARITIN) 10 mg tablet Take 10 mg by mouth at bedtime     magnesium aspart,citrate,oxide (TRIPLE MAGNESIUM COMPLEX) 400 mg magnesium Cap Take by mouth     montelukast (SINGULAIR) 10 mg tablet montelukast 10 mg tablet     simvastatin (ZOCOR) 10 MG tablet      spironolactone (ALDACTONE) 50 MG tablet Take 50 mg by mouth 2 (two) times daily     valACYclovir (VALTREX) 500 MG tablet Take 500 mg by mouth once daily     vortioxetine (TRINTELLIX) 20 mg tablet Take 20 mg by mouth once daily     No current facility-administered medications on file prior to visit.    Family  History  Problem Relation Age of Onset   Diabetes Mother    High blood pressure (Hypertension) Father    Hyperlipidemia (Elevated cholesterol) Father    Coronary Artery Disease (Blocked arteries around heart) Father      Social History   Tobacco Use  Smoking Status Never  Smokeless Tobacco Never     Social History   Socioeconomic History   Marital status: Divorced  Tobacco Use   Smoking status: Never   Smokeless tobacco: Never  Vaping Use   Vaping status: Never Used  Substance and Sexual Activity   Alcohol use: Not Currently   Drug use: Never   Social Drivers of Health   Financial Resource Strain: Low Risk  (06/20/2022)   Received from [facility]   Overall Financial Resource Strain (CARDIA)    Difficulty of Paying Living Expenses: Not hard at all  Food Insecurity: No Food Insecurity (06/20/2022)   Received from [facility]   Hunger Vital Sign    Worried About Running Out of Food in the Last Year: Never true    Ran Out of Food in the Last Year: Never true  Transportation Needs: No Transportation Needs (06/20/2022)   Received from [facility] - Transportation    Lack of Transportation (Medical): No    Lack of Transportation (Non-Medical): No  Physical Activity: Inactive (06/20/2022)   Received from [facility]   Exercise Vital Sign    Days of Exercise per Week: 0 days    Minutes of Exercise per Session: 0 min  Stress: No Stress Concern Present (06/20/2022)   Received from [facility] - Occupational Stress Questionnaire    Feeling of Stress : Not at all  Social Connections: Socially Isolated (06/20/2022)   Received from [facility]   Social Connection and Isolation Panel [NHANES]    Frequency of Communication with Friends and Family: More than three times a week    Frequency of Social Gatherings with Friends and Family: Once a week    Attends Religious Services: Never    Database administrator or Organizations: No     Attends Banker Meetings: Never    Marital Status: Divorced    Objective:    Vitals:   01/01/23 1523  BP: 118/81  Pulse: 98  Temp: 36.7 C (98.1 F)  SpO2: 95%  Weight: 88.4 kg (194 lb 12.8 oz)  Height: 161.9 cm (5' 3.75")  PainSc: 0-No pain    Body mass index is 33.7 kg/m.  Gen: A&Ox3, no distress  Unlabored respirations Abdomen is soft and nontender Well-healed laparoscopic port sites  Assessment and Plan:  Diagnoses and all orders for this visit:  Paraesophageal hiatal hernia Comments: Recurrent  We reviewed the relevant anatomy and disease process.  Given current quality of life and recalcitrant reflux symptoms, I do think she would benefit from redo paraesophageal hernia repair.  We discussed we may or may not need to revise the Nissen.  We discussed that this is a higher risk and more involved surgery than what she went through in 2007 and I would anticipate she is not going to have quite the "light switch "experience with complete resolution and feeling like a normal person after surgery.  We discussed risks of bleeding, infection, pain, scarring, injury to intra-abdominal or mediastinal structures specifically the esophagus or or stomach or pleura, failure to completely resolve symptoms, dysphagia may be transient or chronic and may require endoscopic dilation, gas bloat or poor gastric emptying, chronic nausea, other changes to GI function, possible need for gastropexy, and recurrence of hiatal hernia with or without disruption of fundoplication.  Questions were welcomed and answered to her satisfaction.  She is agreeable to proceed with robotic repair of recurrent paraesophageal hernia and possible revision of Nissen fundoplication, possible gastropexy.    Danette Weinfeld Carlye Grippe, MD

## 2023-01-07 DIAGNOSIS — J3081 Allergic rhinitis due to animal (cat) (dog) hair and dander: Secondary | ICD-10-CM | POA: Diagnosis not present

## 2023-01-07 DIAGNOSIS — J3089 Other allergic rhinitis: Secondary | ICD-10-CM | POA: Diagnosis not present

## 2023-01-07 DIAGNOSIS — J301 Allergic rhinitis due to pollen: Secondary | ICD-10-CM | POA: Diagnosis not present

## 2023-01-08 ENCOUNTER — Ambulatory Visit (HOSPITAL_COMMUNITY)
Admission: RE | Admit: 2023-01-08 | Discharge: 2023-01-08 | Disposition: A | Discharge status: Home / Self Care | Payer: Medicare PPO | Source: Ambulatory Visit | Attending: *Deleted | Admitting: *Deleted

## 2023-01-08 ENCOUNTER — Ambulatory Visit (HOSPITAL_COMMUNITY)
Admission: RE | Admit: 2023-01-08 | Discharge: 2023-01-08 | Disposition: A | Discharge status: Home / Self Care | Payer: Medicare PPO | Source: Ambulatory Visit | Attending: Family Medicine | Admitting: Family Medicine

## 2023-01-08 DIAGNOSIS — R1312 Dysphagia, oropharyngeal phase: Secondary | ICD-10-CM | POA: Insufficient documentation

## 2023-01-08 DIAGNOSIS — R1310 Dysphagia, unspecified: Secondary | ICD-10-CM

## 2023-01-21 DIAGNOSIS — J301 Allergic rhinitis due to pollen: Secondary | ICD-10-CM | POA: Diagnosis not present

## 2023-01-21 DIAGNOSIS — J3081 Allergic rhinitis due to animal (cat) (dog) hair and dander: Secondary | ICD-10-CM | POA: Diagnosis not present

## 2023-01-21 DIAGNOSIS — J3089 Other allergic rhinitis: Secondary | ICD-10-CM | POA: Diagnosis not present

## 2023-01-25 ENCOUNTER — Encounter: Payer: Medicare PPO | Admitting: Gastroenterology

## 2023-02-07 DIAGNOSIS — J3089 Other allergic rhinitis: Secondary | ICD-10-CM | POA: Diagnosis not present

## 2023-02-07 DIAGNOSIS — J3081 Allergic rhinitis due to animal (cat) (dog) hair and dander: Secondary | ICD-10-CM | POA: Diagnosis not present

## 2023-02-07 DIAGNOSIS — J301 Allergic rhinitis due to pollen: Secondary | ICD-10-CM | POA: Diagnosis not present

## 2023-02-19 ENCOUNTER — Ambulatory Visit (HOSPITAL_COMMUNITY): Admit: 2023-02-19 | Payer: Medicare PPO | Admitting: Surgery

## 2023-02-19 SURGERY — REPAIR, HERNIA, PARAESOPHAGEAL, ROBOT-ASSISTED
Anesthesia: General

## 2023-02-21 DIAGNOSIS — J3081 Allergic rhinitis due to animal (cat) (dog) hair and dander: Secondary | ICD-10-CM | POA: Diagnosis not present

## 2023-02-21 DIAGNOSIS — J301 Allergic rhinitis due to pollen: Secondary | ICD-10-CM | POA: Diagnosis not present

## 2023-02-21 DIAGNOSIS — J3089 Other allergic rhinitis: Secondary | ICD-10-CM | POA: Diagnosis not present

## 2023-02-27 ENCOUNTER — Telehealth: Payer: Self-pay | Admitting: Family Medicine

## 2023-02-27 NOTE — Telephone Encounter (Signed)
Please clarify what card? Any action need to be made for clinic

## 2023-02-27 NOTE — Telephone Encounter (Signed)
Her insurance card no impact on the clinic

## 2023-02-27 NOTE — Telephone Encounter (Signed)
Pt called back and will have card scanned in office at upcoming visit.

## 2023-03-04 ENCOUNTER — Encounter: Payer: Self-pay | Admitting: Family Medicine

## 2023-03-04 ENCOUNTER — Ambulatory Visit: Payer: PPO | Admitting: Family Medicine

## 2023-03-04 VITALS — BP 112/80 | HR 87 | Temp 98.7°F | Ht 63.75 in | Wt 195.4 lb

## 2023-03-04 DIAGNOSIS — J3089 Other allergic rhinitis: Secondary | ICD-10-CM | POA: Diagnosis not present

## 2023-03-04 DIAGNOSIS — I10 Essential (primary) hypertension: Secondary | ICD-10-CM | POA: Diagnosis not present

## 2023-03-04 DIAGNOSIS — J301 Allergic rhinitis due to pollen: Secondary | ICD-10-CM | POA: Diagnosis not present

## 2023-03-04 DIAGNOSIS — E785 Hyperlipidemia, unspecified: Secondary | ICD-10-CM

## 2023-03-04 DIAGNOSIS — J3081 Allergic rhinitis due to animal (cat) (dog) hair and dander: Secondary | ICD-10-CM | POA: Diagnosis not present

## 2023-03-04 DIAGNOSIS — E669 Obesity, unspecified: Secondary | ICD-10-CM

## 2023-03-04 DIAGNOSIS — E038 Other specified hypothyroidism: Secondary | ICD-10-CM

## 2023-03-04 LAB — LIPID PANEL
Cholesterol: 199 mg/dL (ref 0–200)
HDL: 66.8 mg/dL (ref >39.00)
LDL Cholesterol: 103 mg/dL — ABNORMAL HIGH (ref 0–99)
NonHDL: 132.37
Total CHOL/HDL Ratio: 3
Triglycerides: 148 mg/dL (ref 0.0–149.0)
VLDL: 29.6 mg/dL (ref 0.0–40.0)

## 2023-03-04 LAB — BASIC METABOLIC PANEL
BUN: 25 mg/dL — ABNORMAL HIGH (ref 6–23)
CO2: 26 meq/L (ref 19–32)
Calcium: 10.2 mg/dL (ref 8.4–10.5)
Chloride: 101 meq/L (ref 96–112)
Creatinine, Ser: 1.26 mg/dL — ABNORMAL HIGH (ref 0.40–1.20)
GFR: 43.6 mL/min — ABNORMAL LOW (ref >60.00)
Glucose, Bld: 93 mg/dL (ref 70–99)
Potassium: 4.3 meq/L (ref 3.5–5.1)
Sodium: 136 meq/L (ref 135–145)

## 2023-03-04 LAB — HEPATIC FUNCTION PANEL
ALT: 20 U/L (ref 0–35)
AST: 27 U/L (ref 0–37)
Albumin: 4.5 g/dL (ref 3.5–5.2)
Alkaline Phosphatase: 123 U/L — ABNORMAL HIGH (ref 39–117)
Bilirubin, Direct: 0.2 mg/dL (ref 0.0–0.3)
Total Bilirubin: 0.5 mg/dL (ref 0.2–1.2)
Total Protein: 7.2 g/dL (ref 6.0–8.3)

## 2023-03-04 LAB — CBC WITH DIFFERENTIAL/PLATELET
Basophils Absolute: 0.1 10*3/uL (ref 0.0–0.1)
Basophils Relative: 1 % (ref 0.0–3.0)
Eosinophils Absolute: 0.2 10*3/uL (ref 0.0–0.7)
Eosinophils Relative: 2.4 % (ref 0.0–5.0)
HCT: 46.2 % — ABNORMAL HIGH (ref 36.0–46.0)
Hemoglobin: 15.4 g/dL — ABNORMAL HIGH (ref 12.0–15.0)
Lymphocytes Relative: 29.3 % (ref 12.0–46.0)
Lymphs Abs: 2.3 10*3/uL (ref 0.7–4.0)
MCHC: 33.3 g/dL (ref 30.0–36.0)
MCV: 101.4 fL — ABNORMAL HIGH (ref 78.0–100.0)
Monocytes Absolute: 0.9 10*3/uL (ref 0.1–1.0)
Monocytes Relative: 11.4 % (ref 3.0–12.0)
Neutro Abs: 4.5 10*3/uL (ref 1.4–7.7)
Neutrophils Relative %: 55.9 % (ref 43.0–77.0)
Platelets: 292 10*3/uL (ref 150.0–400.0)
RBC: 4.55 Mil/uL (ref 3.87–5.11)
RDW: 12.4 % (ref 11.5–15.5)
WBC: 8 10*3/uL (ref 4.0–10.5)

## 2023-03-04 LAB — TSH: TSH: 2.05 u[IU]/mL (ref 0.35–5.50)

## 2023-03-04 NOTE — Assessment & Plan Note (Signed)
Chronic problem, on Spironolactone 50mg  BID w/ good control.  Currently asymptomatic.  Check labs due to diuretic use but no anticipated med changes.

## 2023-03-04 NOTE — Assessment & Plan Note (Signed)
Chronic problem.  On Simvastatin 20mg  daily w/o difficulty.  Check labs.  Adjust meds prn

## 2023-03-04 NOTE — Patient Instructions (Signed)
Schedule your complete physical in 6 months We'll notify you of your lab results and make any changes if needed Keep up the good work on healthy diet and regular exercise- I'm proud of you! Call with any questions or concerns Stay Safe!  Stay Healthy! Happy New Year!!

## 2023-03-04 NOTE — Assessment & Plan Note (Signed)
Chronic problem, on Levothyroxine daily.  Currently asymptomatic.  Check labs.  Adjust meds prn

## 2023-03-04 NOTE — Progress Notes (Signed)
   Subjective:    Patient ID: Marcia Adams, female    DOB: 02-Oct-1955, 68 y.o.   MRN: [id number]  HPI Hyperlipidemia- chronic problem, on Simvastatin 20mg  daily.  No abd pain, N/V.  Hypothyroid- chronic problem, on Levothyroxine daily.  No changes to skin/hair/nails  HTN- chronic problem, on Spironolactone 50mg  BID.  Well controlled.  No CP, SOB, HA's, visual changes  Obesity- ongoing issue.  Weight is stable and BMI 33.8  Pt got a new pair of shoes and some ear phones and plans to start walking regularly.     Review of Systems For ROS see HPI     Objective:   Physical Exam Vitals reviewed.  Constitutional:      General: She is not in acute distress.    Appearance: Normal appearance. She is well-developed. She is not ill-appearing.  HENT:     Head: Normocephalic and atraumatic.  Eyes:     Conjunctiva/sclera: Conjunctivae normal.     Pupils: Pupils are equal, round, and reactive to light.  Neck:     Thyroid: No thyromegaly.  Cardiovascular:     Rate and Rhythm: Normal rate and regular rhythm.     Pulses: Normal pulses.     Heart sounds: Normal heart sounds. No murmur heard. Pulmonary:     Effort: Pulmonary effort is normal. No respiratory distress.     Breath sounds: Normal breath sounds.  Abdominal:     General: There is no distension.     Palpations: Abdomen is soft.     Tenderness: There is no abdominal tenderness.  Musculoskeletal:     Cervical back: Normal range of motion and neck supple.     Right lower leg: No edema.     Left lower leg: No edema.  Lymphadenopathy:     Cervical: No cervical adenopathy.  Skin:    General: Skin is warm and dry.  Neurological:     General: No focal deficit present.     Mental Status: She is alert and oriented to person, place, and time.  Psychiatric:        Mood and Affect: Mood normal.        Behavior: Behavior normal.        Thought Content: Thought content normal.           Assessment & Plan:

## 2023-03-04 NOTE — Assessment & Plan Note (Signed)
Ongoing issue.  Weight is stable, BMI 33.8  She is excited as she got a new pair of sneakers and head phones to start walking.  Applauded her efforts.  Will follow.

## 2023-03-05 ENCOUNTER — Telehealth: Payer: Self-pay

## 2023-03-05 ENCOUNTER — Other Ambulatory Visit: Payer: Self-pay | Admitting: Family Medicine

## 2023-03-05 DIAGNOSIS — E038 Other specified hypothyroidism: Secondary | ICD-10-CM

## 2023-03-05 NOTE — Telephone Encounter (Signed)
Pt has reviewed labs via MyChart

## 2023-03-05 NOTE — Telephone Encounter (Signed)
-----   Message from Neena Rhymes sent at 03/04/2023  8:02 PM EST ----- Labs are stable and look good!  No changes at this time.

## 2023-03-07 DIAGNOSIS — J3089 Other allergic rhinitis: Secondary | ICD-10-CM | POA: Diagnosis not present

## 2023-03-07 DIAGNOSIS — J301 Allergic rhinitis due to pollen: Secondary | ICD-10-CM | POA: Diagnosis not present

## 2023-03-07 DIAGNOSIS — J3081 Allergic rhinitis due to animal (cat) (dog) hair and dander: Secondary | ICD-10-CM | POA: Diagnosis not present

## 2023-03-18 DIAGNOSIS — J3089 Other allergic rhinitis: Secondary | ICD-10-CM | POA: Diagnosis not present

## 2023-03-18 DIAGNOSIS — J301 Allergic rhinitis due to pollen: Secondary | ICD-10-CM | POA: Diagnosis not present

## 2023-03-18 DIAGNOSIS — J3081 Allergic rhinitis due to animal (cat) (dog) hair and dander: Secondary | ICD-10-CM | POA: Diagnosis not present

## 2023-03-25 DIAGNOSIS — H31093 Other chorioretinal scars, bilateral: Secondary | ICD-10-CM | POA: Diagnosis not present

## 2023-03-25 DIAGNOSIS — H43813 Vitreous degeneration, bilateral: Secondary | ICD-10-CM | POA: Diagnosis not present

## 2023-03-25 DIAGNOSIS — H353131 Nonexudative age-related macular degeneration, bilateral, early dry stage: Secondary | ICD-10-CM | POA: Diagnosis not present

## 2023-03-25 DIAGNOSIS — H35432 Paving stone degeneration of retina, left eye: Secondary | ICD-10-CM | POA: Diagnosis not present

## 2023-04-03 DIAGNOSIS — J3089 Other allergic rhinitis: Secondary | ICD-10-CM | POA: Diagnosis not present

## 2023-04-03 DIAGNOSIS — J3081 Allergic rhinitis due to animal (cat) (dog) hair and dander: Secondary | ICD-10-CM | POA: Diagnosis not present

## 2023-04-03 DIAGNOSIS — J301 Allergic rhinitis due to pollen: Secondary | ICD-10-CM | POA: Diagnosis not present

## 2023-04-19 DIAGNOSIS — J3081 Allergic rhinitis due to animal (cat) (dog) hair and dander: Secondary | ICD-10-CM | POA: Diagnosis not present

## 2023-04-19 DIAGNOSIS — J3089 Other allergic rhinitis: Secondary | ICD-10-CM | POA: Diagnosis not present

## 2023-04-19 DIAGNOSIS — J301 Allergic rhinitis due to pollen: Secondary | ICD-10-CM | POA: Diagnosis not present

## 2023-04-24 DIAGNOSIS — L438 Other lichen planus: Secondary | ICD-10-CM | POA: Diagnosis not present

## 2023-04-24 DIAGNOSIS — D1801 Hemangioma of skin and subcutaneous tissue: Secondary | ICD-10-CM | POA: Diagnosis not present

## 2023-04-24 DIAGNOSIS — L700 Acne vulgaris: Secondary | ICD-10-CM | POA: Diagnosis not present

## 2023-04-24 DIAGNOSIS — Z79899 Other long term (current) drug therapy: Secondary | ICD-10-CM | POA: Diagnosis not present

## 2023-05-03 DIAGNOSIS — J301 Allergic rhinitis due to pollen: Secondary | ICD-10-CM | POA: Diagnosis not present

## 2023-05-03 DIAGNOSIS — J3089 Other allergic rhinitis: Secondary | ICD-10-CM | POA: Diagnosis not present

## 2023-05-03 DIAGNOSIS — J3081 Allergic rhinitis due to animal (cat) (dog) hair and dander: Secondary | ICD-10-CM | POA: Diagnosis not present

## 2023-05-15 DIAGNOSIS — J3081 Allergic rhinitis due to animal (cat) (dog) hair and dander: Secondary | ICD-10-CM | POA: Diagnosis not present

## 2023-05-15 DIAGNOSIS — J301 Allergic rhinitis due to pollen: Secondary | ICD-10-CM | POA: Diagnosis not present

## 2023-05-15 DIAGNOSIS — J3089 Other allergic rhinitis: Secondary | ICD-10-CM | POA: Diagnosis not present

## 2023-05-31 DIAGNOSIS — J301 Allergic rhinitis due to pollen: Secondary | ICD-10-CM | POA: Diagnosis not present

## 2023-05-31 DIAGNOSIS — J3081 Allergic rhinitis due to animal (cat) (dog) hair and dander: Secondary | ICD-10-CM | POA: Diagnosis not present

## 2023-05-31 DIAGNOSIS — J3089 Other allergic rhinitis: Secondary | ICD-10-CM | POA: Diagnosis not present

## 2023-06-09 ENCOUNTER — Ambulatory Visit
Admission: RE | Admit: 2023-06-09 | Discharge: 2023-06-09 | Disposition: A | Discharge status: Home / Self Care | Source: Ambulatory Visit | Attending: Family Medicine | Admitting: Family Medicine

## 2023-06-09 VITALS — BP 155/98 | HR 97 | Temp 98.5°F | Resp 15

## 2023-06-09 DIAGNOSIS — S61451A Open bite of right hand, initial encounter: Secondary | ICD-10-CM

## 2023-06-09 DIAGNOSIS — W5501XA Bitten by cat, initial encounter: Secondary | ICD-10-CM

## 2023-06-09 MED ORDER — DOXYCYCLINE HYCLATE 100 MG PO CAPS: 100.0000 mg | ORAL_CAPSULE | Freq: Two times a day (BID) | ORAL | 0 refills | Status: DC

## 2023-06-09 MED ORDER — METRONIDAZOLE 500 MG PO TABS: 500.0000 mg | ORAL_TABLET | Freq: Three times a day (TID) | ORAL | 0 refills | Status: AC

## 2023-06-09 NOTE — Discharge Instructions (Addendum)
 Wound clean and dry.  Start doxycycline twice daily for 10 days and metronidazole  3 times a day for 7 days.  Monitor for any signs of worsening infection.  This includes but is not limited to increased redness, swelling, drainage, fevers or chills and seek reevaluation in the emergency room if these occur.  Follow-up with your PCP in 2 to 3 days for recheck.  Hope you feel better soon!

## 2023-06-09 NOTE — ED Triage Notes (Addendum)
 Pt present with a cat bite to the rt hand. C/o redness, swelling and feels hot to the touch. Her cat bit her on Thursday symptoms developed on Saturday.

## 2023-06-09 NOTE — ED Provider Notes (Signed)
 [facility]    CSN: [id number] Arrival date & time: 06/09/23  1110      History   Chief Complaint Chief Complaint  Patient presents with   Appointment   Animal Bite    CAT    HPI Marcia Adams is a 68 y.o. female presents for cat bite.  Patient reports 4 days ago she was bitten by her own cat on the palm of her right hand.  States she has been cleaning it with soap and water but the past couple days has noticed redness and swelling to the area.  No drainage fevers or chills.  No history of MRSA.  Patient is up-to-date on her tetanus and states the cat is up-to-date on its rabies vaccines.  No other concerns at this time.   Animal Bite   Past Medical History:  Diagnosis Date   Allergic rhinitis    Allergy    Anemia    Anxiety    Asthma    Depression    Diverticulitis    Esophageal stricture    GERD (gastroesophageal reflux disease)    Heart murmur    mild since birth   Hiatal hernia    Hyperlipidemia    Hypertension    Hypothyroidism    Insomnia    Kidney stones    Menopause    Renal insufficiency    Vitamin D  deficiency     Patient Active Problem List   Diagnosis Date Noted   Asthma 10/04/2022   Major depressive disorder, recurrent (HCC) 08/31/2022   Allergic rhinitis due to animal (cat) (dog) hair and dander 03/13/2021   Allergic rhinitis due to pollen 03/13/2021   Incontinence of urine in female 03/13/2021   Physical exam 12/10/2016   ASCUS with positive high risk HPV  Dr. 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   Postmenopausal atrophic vaginitis 05/26/2011   Herpes 04/24/2011   Anxiety    Hypothyroidism 04/23/2007   Reflux esophagitis 02/20/2005   Obesity (BMI 30-39.9) 10/04/2004   Depression 11/10/2003   Allergic rhinitis 11/10/2003   Insomnia 11/10/2003   Hyperlipidemia 01/14/2002   HTN (hypertension) 01/24/1999   Allergic rhinitis due to dust 01/24/1999     Past Surgical History:  Procedure Laterality Date   BREAST BIOPSY Left 10/25/2022   MM LT BREAST BX W LOC DEV 1ST LESION IMAGE BX SPEC STEREO GUIDE 10/25/2022 [facility]   COLONOSCOPY  01/05/2019   [facility]. Dr. Lavaughn [place]: Internal hemorrhoids, diverticulosis in the sigmoid colon through hepatic flexure.  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  Pt not to lye completely flat.   STOMACH SURGERY     UPPER GASTROINTESTINAL ENDOSCOPY      OB History     Gravida  2   Para  2   Term      Preterm      AB      Living         SAB  IAB      Ectopic      Multiple      Live Births               Home Medications    Prior to Admission medications   Medication Sig Start Date End Date Taking? Authorizing Provider  doxycycline (VIBRAMYCIN) 100 MG capsule Take 1 capsule (100 mg total) by mouth 2 (two) times daily. 06/09/23  Yes Keyaria Lawson, Jodi R, NP  metroNIDAZOLE  (FLAGYL ) 500 MG tablet Take 1 tablet (500 mg total) by mouth 3 (three) times daily for 7 days. 06/09/23 06/16/23 Yes Hawthorne Day, Jodi R, NP  acyclovir  (ZOVIRAX ) 400 MG tablet  05/09/21   [provider]  AUVELITY 45-105 MG TBCR Take by mouth. 02/21/23   [provider]  Azelastine-Fluticasone 137-50 MCG/ACT SUSP Place into the nose.    [provider]  cetirizine (ZYRTEC ALLERGY) 10 MG tablet Take 10 mg by mouth at bedtime.    [provider]  clindamycin (CLINDAGEL) 1 % gel Apply topically daily.     [provider]  COMIRNATY syringe  10/22/22   [provider]  Doxepin HCl 6 MG TABS  06/06/20   [provider]  EPIPEN 2-PAK 0.3 MG/0.3ML DEVI Inject 0.3 mg into the muscle daily as needed (allergic reaction). 06/02/11   [provider]  esomeprazole  (NEXIUM ) 40 MG capsule Take 1 capsule (40 mg total) by mouth daily at 12 noon. 12/13/22   Cirigliano, Vito V, DO  Eszopiclone 3 MG TABS Take 3 mg by mouth at bedtime. Take immediately before bedtime    [provider]  fluticasone (FLONASE) 50 MCG/ACT nasal spray SMARTSIG:1-2 Spray(s) Both Nares Daily 08/15/21   [provider]  fluticasone-salmeterol (ADVAIR) 250-50 MCG/ACT AEPB Inhale 1 puff into the lungs in the morning and at bedtime.    [provider]  levothyroxine  (SYNTHROID ) 88 MCG tablet TAKE 1 TABLET BY MOUTH DAILY 03/05/23   Tabori, Katherine E, MD  loratadine  (CLARITIN ) 10 MG tablet Take 10 mg by mouth at bedtime.    [provider]  Magnesium  400 MG CAPS Take by mouth.    [provider]  montelukast (SINGULAIR) 10 MG tablet TK 1 T PO QD IN THE EVE 07/22/16   [provider]  simvastatin  (ZOCOR ) 20 MG tablet Take 1 tablet (20 mg total) by mouth at bedtime. 09/03/22   Tabori, Katherine E, MD  spironolactone (ALDACTONE) 50 MG tablet Take 50 mg by mouth 2 (two) times daily.     [provider]  tazarotene  (AVAGE ) 0.1 % cream Apply topically at bedtime.    [provider]  valACYclovir (VALTREX) 500 MG tablet Take 500 mg by mouth daily.    [provider]  azelastine (ASTELIN) 137 MCG/SPRAY nasal spray One spray in each nostril twice daily as needed for allergies.   04/24/11  [provider]    Family History Family History  Problem Relation Age of Onset   COPD Mother    Congestive Heart Failure Mother    Dementia Mother    COPD Father    Congestive Heart Failure Father    Depression Brother    Urolithiasis Brother    Arthritis Maternal Grandmother    Kidney cancer Maternal  Grandfather    Eczema Daughter    Colon cancer Neg Hx    Esophageal cancer Neg Hx    Rectal cancer Neg Hx    Stomach cancer Neg Hx     Social  History Social History   Tobacco Use   Smoking status: Never   Smokeless tobacco: Never  Vaping Use   Vaping status: Never Used  Substance Use Topics   Alcohol use: Not Currently    Comment: occasionally   Drug use: No     Allergies   Ciprofloxacin , Penicillins, Aripiprazole, Aripiprazole, Escitalopram oxalate, Levocetirizine dihydrochloride, Quinolones, and Enalapril   Review of Systems Review of Systems  Skin:  Positive for wound.     Physical Exam Triage Vital Signs ED Triage Vitals  Encounter Vitals Group     BP 06/09/23 1144 (!) 155/98     Systolic BP Percentile --      Diastolic BP Percentile --      Pulse Rate 06/09/23 1142 97     Resp 06/09/23 1142 15     Temp 06/09/23 1144 98.5 F (36.9 C)     Temp Source 06/09/23 1144 Oral     SpO2 06/09/23 1142 94 %     Weight --      Height --      Head Circumference --      Peak Flow --      Pain Score 06/09/23 1142 7     Pain Loc --      Pain Education --      Exclude from Growth Chart --    No data found.  Updated Vital Signs BP (!) 155/98   Pulse 97   Temp 98.5 F (36.9 C) (Oral)   Resp 15   SpO2 94%   Visual Acuity Right Eye Distance:   Left Eye Distance:   Bilateral Distance:    Right Eye Near:   Left Eye Near:    Bilateral Near:     Physical Exam Vitals and nursing note reviewed.  Constitutional:      General: She is not in acute distress.    Appearance: Normal appearance. She is not ill-appearing.  HENT:     Head: Normocephalic and atraumatic.  Eyes:     Pupils: Pupils are equal, round, and reactive to light.  Cardiovascular:     Rate and Rhythm: Normal rate.  Pulmonary:     Effort: Pulmonary effort is normal.  Musculoskeletal:       Hands:     Comments: There are 3 puncture wounds to the lateral palmar aspect of the right hand.  Mild  swelling and erythema.  No drainage induration or fluctuance.  Mildly tender to palpation.  See photo.  Skin:    General: Skin is warm and dry.  Neurological:     General: No focal deficit present.     Mental Status: She is alert and oriented to person, place, and time.  Psychiatric:        Mood and Affect: Mood normal.        Behavior: Behavior normal.      UC Treatments / Results  Labs (all labs ordered are listed, but only abnormal results are displayed) Labs Reviewed - No data to display  Basic metabolic panel Order: [id number]  Status: Final result     Next appt: 08/06/2023 at 01:10 PM in Family Medicine ([facility] ANNUAL WELLNESS VISIT)     Dx: Hyperlipidemia, unspecified hyperlipi...   Test Result Released: Yes (seen)     Messages: Seen   1 Result Note     1 Patient Communication     2 Follow-up Encounters          Component Ref Range & Units (hover) 3  mo ago (03/04/23) 9 mo ago (08/31/22) 2 yr ago (03/13/21) 2 yr ago (08/04/20) 3 yr ago (02/15/20) 3 yr ago (02/04/20) 3 yr ago (07/30/19)  Sodium 136 137 139 136 137 136 136  Potassium 4.3 4.4 4.1 4.3 3.9 4.4 4.0  Chloride 101 100 102 102 103 101 109  CO2 26 26 29 22 25 27 26   Glucose, Bld 93 104 High  86 89 90 98 91  BUN 25 High  28 High  29 High  26 High  18 27 High  21  Creatinine, Ser 1.26 High  1.22 High  1.01 1.33 High  1.35 High  1.35 High  1.26 High   GFR 43.60 Low  45.48 Low  CM 57.65 Low  CM 41.61 Low  CM 41.01 Low  CM 41.01 Low  CM 42.52 Low   Comment: Calculated using the CKD-EPI Creatinine Equation (2021)  Calcium 10.2 10.5 9.6 9.5 9.9 9.9 9.9  Resulting Agency Rafael Capo [facility] [place] [facility] [place] [facility] [place] [facility] [place] [facility] [place] [facility] [place] [facility]        Specimen Collected: 03/04/23 13:13 Last Resulted: 03/04/23 17:24    EKG   Radiology No results found.  Procedures Procedures (including critical care time)  Medications Ordered in UC Medications - No data to  display  Initial Impression / Assessment and Plan / UC Course  I have reviewed the triage vital signs and the nursing notes.  Pertinent labs & imaging results that were available during my care of the patient were reviewed by me and considered in my medical decision making (see chart for details).     Reviewed exam and symptoms with patient.  No red flags.  Patient is up-to-date on her tetanus and the cat per patient is up-to-date on its rabies.  Patient has allergy to penicillin with rash, will do doxycycline and metronidazole  (creatinine clearance 59).  Advised wound care and to monitor symptoms closely.  Follow-up with PCP 2 to 3 days for recheck.  Strict ER precautions reviewed and patient verbalized understanding Final Clinical Impressions(s) / UC Diagnoses   Final diagnoses:  Cat bite of right hand, initial encounter     Discharge Instructions      Wound clean and dry.  Start doxycycline twice daily for 10 days and metronidazole  3 times a day for 7 days.  Monitor for any signs of worsening infection.  This includes but is not limited to increased redness, swelling, drainage, fevers or chills and seek reevaluation in the emergency room if these occur.  Follow-up with your PCP in 2 to 3 days for recheck.  Hope you feel better soon!    ED Prescriptions     Medication Sig Dispense Auth. Provider   doxycycline (VIBRAMYCIN) 100 MG capsule Take 1 capsule (100 mg total) by mouth 2 (two) times daily. 20 capsule Pranit Owensby, Jodi R, NP   metroNIDAZOLE  (FLAGYL ) 500 MG tablet Take 1 tablet (500 mg total) by mouth 3 (three) times daily for 7 days. 21 tablet Meril Dray, Jodi R, NP      PDMP not reviewed this encounter.   Alleen Arbour, NP 06/09/23 1210

## 2023-06-10 DIAGNOSIS — H1045 Other chronic allergic conjunctivitis: Secondary | ICD-10-CM | POA: Diagnosis not present

## 2023-06-10 DIAGNOSIS — J3081 Allergic rhinitis due to animal (cat) (dog) hair and dander: Secondary | ICD-10-CM | POA: Diagnosis not present

## 2023-06-10 DIAGNOSIS — J3089 Other allergic rhinitis: Secondary | ICD-10-CM | POA: Diagnosis not present

## 2023-06-10 DIAGNOSIS — J301 Allergic rhinitis due to pollen: Secondary | ICD-10-CM | POA: Diagnosis not present

## 2023-06-10 DIAGNOSIS — R052 Subacute cough: Secondary | ICD-10-CM | POA: Diagnosis not present

## 2023-06-18 DIAGNOSIS — J301 Allergic rhinitis due to pollen: Secondary | ICD-10-CM | POA: Diagnosis not present

## 2023-06-18 DIAGNOSIS — J3089 Other allergic rhinitis: Secondary | ICD-10-CM | POA: Diagnosis not present

## 2023-06-18 DIAGNOSIS — J3081 Allergic rhinitis due to animal (cat) (dog) hair and dander: Secondary | ICD-10-CM | POA: Diagnosis not present

## 2023-06-21 ENCOUNTER — Telehealth: Payer: Self-pay | Admitting: Family Medicine

## 2023-06-21 DIAGNOSIS — E038 Other specified hypothyroidism: Secondary | ICD-10-CM

## 2023-06-21 MED ORDER — LEVOTHYROXINE SODIUM 88 MCG PO TABS: 88.0000 ug | ORAL_TABLET | Freq: Every day | ORAL | 0 refills | Status: DC

## 2023-06-21 NOTE — Telephone Encounter (Signed)
 Copied from CRM [id number]. Topic: Clinical - Medication Refill >> Jun 21, 2023  8:39 AM Jim Motts C wrote: Medication: levothyroxine  (SYNTHROID ) 88 MCG tablet  Has the patient contacted their pharmacy? Yes. [facility] sent all the prescriptions except this one needing a refill. [facility] doesn't have it due to [facility] not sending it.  (Agent: If no, request that the patient contact the pharmacy for the refill. If patient does not wish to contact the pharmacy document the reason why and proceed with request.) (Agent: If yes, when and what did the pharmacy advise?)  This is the patient's preferred pharmacy:  [facility] [id number] [place], [place] - [address] [place] [address] Phone: [phone] Fax: [phone]   Is this the correct pharmacy for this prescription? Yes If no, delete pharmacy and type the correct one.   Has the prescription been filled recently? No  Is the patient out of the medication? Yes. Patient requested to have it written for 90 days.   Has the patient been seen for an appointment in the last year OR does the patient have an upcoming appointment? Yes  Can we respond through MyChart? Yes  Agent: Please be advised that Rx refills may take up to 3 business days. We ask that you follow-up with your pharmacy.

## 2023-06-26 DIAGNOSIS — J301 Allergic rhinitis due to pollen: Secondary | ICD-10-CM | POA: Diagnosis not present

## 2023-06-26 DIAGNOSIS — J3089 Other allergic rhinitis: Secondary | ICD-10-CM | POA: Diagnosis not present

## 2023-06-26 DIAGNOSIS — J3081 Allergic rhinitis due to animal (cat) (dog) hair and dander: Secondary | ICD-10-CM | POA: Diagnosis not present

## 2023-07-02 DIAGNOSIS — J3089 Other allergic rhinitis: Secondary | ICD-10-CM | POA: Diagnosis not present

## 2023-07-02 DIAGNOSIS — J301 Allergic rhinitis due to pollen: Secondary | ICD-10-CM | POA: Diagnosis not present

## 2023-07-02 DIAGNOSIS — J3081 Allergic rhinitis due to animal (cat) (dog) hair and dander: Secondary | ICD-10-CM | POA: Diagnosis not present

## 2023-07-11 DIAGNOSIS — J3081 Allergic rhinitis due to animal (cat) (dog) hair and dander: Secondary | ICD-10-CM | POA: Diagnosis not present

## 2023-07-11 DIAGNOSIS — J3089 Other allergic rhinitis: Secondary | ICD-10-CM | POA: Diagnosis not present

## 2023-07-11 DIAGNOSIS — J301 Allergic rhinitis due to pollen: Secondary | ICD-10-CM | POA: Diagnosis not present

## 2023-07-23 DIAGNOSIS — J3081 Allergic rhinitis due to animal (cat) (dog) hair and dander: Secondary | ICD-10-CM | POA: Diagnosis not present

## 2023-07-23 DIAGNOSIS — J3089 Other allergic rhinitis: Secondary | ICD-10-CM | POA: Diagnosis not present

## 2023-07-23 DIAGNOSIS — J301 Allergic rhinitis due to pollen: Secondary | ICD-10-CM | POA: Diagnosis not present

## 2023-07-29 ENCOUNTER — Encounter: Payer: Self-pay | Admitting: Family Medicine

## 2023-08-01 DIAGNOSIS — J3089 Other allergic rhinitis: Secondary | ICD-10-CM | POA: Diagnosis not present

## 2023-08-01 DIAGNOSIS — J3081 Allergic rhinitis due to animal (cat) (dog) hair and dander: Secondary | ICD-10-CM | POA: Diagnosis not present

## 2023-08-01 DIAGNOSIS — J301 Allergic rhinitis due to pollen: Secondary | ICD-10-CM | POA: Diagnosis not present

## 2023-08-06 ENCOUNTER — Ambulatory Visit (INDEPENDENT_AMBULATORY_CARE_PROVIDER_SITE_OTHER): Admitting: *Deleted

## 2023-08-06 VITALS — Ht 65.0 in | Wt 190.0 lb

## 2023-08-06 DIAGNOSIS — Z1211 Encounter for screening for malignant neoplasm of colon: Secondary | ICD-10-CM

## 2023-08-06 DIAGNOSIS — Z0000 Encounter for general adult medical examination without abnormal findings: Secondary | ICD-10-CM | POA: Diagnosis not present

## 2023-08-06 NOTE — Progress Notes (Signed)
 Subjective:   Marcia Adams is a 68 y.o. female who presents for Medicare Annual (Subsequent) preventive examination.  Visit Complete: Virtual I connected with  Marcia Adams on 08/06/23 by a audio enabled telemedicine application and verified that I am speaking with the correct person using two identifiers.  Patient Location: Home  Provider Location: Home Office  I discussed the limitations of evaluation and management by telemedicine. The patient expressed understanding and agreed to proceed.  Vital Signs: Because this visit was a virtual/telehealth visit, some criteria may be missing or patient reported. Any vitals not documented were not able to be obtained and vitals that have been documented are patient reported.  Patient Medicare AWV questionnaire was completed by the patient on 07-30-2023; I have confirmed that all information answered by patient is correct and no changes since this date.  Cardiac Risk Factors include: advanced age (>27men, >2 women);obesity (BMI >30kg/m2);female gender;hypertension     Objective:    Today's Vitals   08/06/23 1320  Weight: 190 lb (86.2 kg)  Height: 5' 5 (1.651 m)   Body mass index is 31.62 kg/m.     08/06/2023    1:39 PM 08/24/2021   12:57 PM 07/18/2020    1:53 PM 07/14/2019    9:02 AM 12/06/2016    5:07 AM 02/23/2014    2:38 PM 12/15/2013    3:31 PM  Advanced Directives  Does Patient Have a Medical Advance Directive? No Yes Yes Yes No;Yes  No  No   Type of Special educational needs teacher of [place];Living will Healthcare Power of [place];Living will Healthcare Power of [place];Living will Healthcare Power of [place];Living will    Does patient want to make changes to medical advance directive?    Yes (MAU/Ambulatory/Procedural Areas - Information given)     Copy of Healthcare Power of Attorney in Chart?  No - copy requested No - copy requested No - copy requested Yes     Would patient like information on creating a medical  advance directive? No - Patient declined     No - patient declined information  No - patient declined information      Data saved with a previous flowsheet row definition    Current Medications (verified) Outpatient Encounter Medications as of 08/06/2023  Medication Sig   acyclovir  (ZOVIRAX ) 400 MG tablet    AUVELITY 45-105 MG TBCR Take by mouth.   Azelastine-Fluticasone 137-50 MCG/ACT SUSP Place into the nose.   cetirizine (ZYRTEC ALLERGY) 10 MG tablet Take 10 mg by mouth at bedtime.   clindamycin (CLINDAGEL) 1 % gel Apply topically daily.    COMIRNATY syringe    Doxepin HCl 6 MG TABS    EPIPEN 2-PAK 0.3 MG/0.3ML DEVI Inject 0.3 mg into the muscle daily as needed (allergic reaction).   esomeprazole  (NEXIUM ) 40 MG capsule Take 1 capsule (40 mg total) by mouth daily at 12 noon.   Eszopiclone 3 MG TABS Take 3 mg by mouth at bedtime. Take immediately before bedtime   fluticasone (FLONASE) 50 MCG/ACT nasal spray SMARTSIG:1-2 Spray(s) Both Nares Daily   fluticasone-salmeterol (ADVAIR) 250-50 MCG/ACT AEPB Inhale 1 puff into the lungs in the morning and at bedtime.   levothyroxine  (SYNTHROID ) 88 MCG tablet Take 1 tablet (88 mcg total) by mouth daily.   loratadine  (CLARITIN ) 10 MG tablet Take 10 mg by mouth at bedtime.   Magnesium  400 MG CAPS Take by mouth.   montelukast (SINGULAIR) 10 MG tablet TK 1 T PO QD IN THE  EVE   simvastatin  (ZOCOR ) 20 MG tablet Take 1 tablet (20 mg total) by mouth at bedtime.   spironolactone (ALDACTONE) 50 MG tablet Take 50 mg by mouth 2 (two) times daily.    tazarotene  (AVAGE ) 0.1 % cream Apply topically at bedtime.   valACYclovir (VALTREX) 500 MG tablet Take 500 mg by mouth daily.   doxycycline  (VIBRAMYCIN ) 100 MG capsule Take 1 capsule (100 mg total) by mouth 2 (two) times daily.   [DISCONTINUED] azelastine (ASTELIN) 137 MCG/SPRAY nasal spray One spray in each nostril twice daily as needed for allergies.    No facility-administered encounter medications on file as  of 08/06/2023.    Allergies (verified) Ciprofloxacin , Penicillins, Aripiprazole, Aripiprazole, Escitalopram oxalate, Levocetirizine dihydrochloride, Quinolones, and Enalapril   History: Past Medical History:  Diagnosis Date   Allergic rhinitis    Allergy    Anemia    Anxiety    Asthma    Depression    Diverticulitis    Esophageal stricture    GERD (gastroesophageal reflux disease)    Heart murmur    mild since birth   Hiatal hernia    Hyperlipidemia    Hypertension    Hypothyroidism    Insomnia    Kidney stones    Menopause    Renal insufficiency    Vitamin D  deficiency    Past Surgical History:  Procedure Laterality Date   BREAST BIOPSY Left 10/25/2022   MM LT BREAST BX W LOC DEV 1ST LESION IMAGE BX SPEC STEREO GUIDE 10/25/2022 [facility]   COLONOSCOPY  01/05/2019   [facility] Gastroenter[facility]. Dr. Rosalie: Internal hemorrhoids, diverticulosis in the sigmoid colon through hepatic flexure.  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  Pt not to lye completely flat.   STOMACH SURGERY     UPPER GASTROINTESTINAL ENDOSCOPY     Family History  Problem Relation Age of Onset   COPD Mother    Congestive Heart Failure Mother    Dementia Mother    COPD Father    Congestive Heart Failure Father    Depression Brother    Urolithiasis Brother    Arthritis Maternal Grandmother    Kidney cancer Maternal Grandfather    Eczema Daughter    Colon cancer Neg Hx    Esophageal cancer Neg Hx    Rectal cancer Neg Hx    Stomach cancer Neg Hx    Social History   Socioeconomic  History   Marital status: Divorced    Spouse name: Not on file   Number of children: 2   Years of education: Not on file   Highest education level: Master's degree (e.g., MA, MS, MEng, MEd, MSW, MBA)  Occupational History   Occupation: Tax adviser: [employer]  Tobacco Use   Smoking status: Never   Smokeless tobacco: Never  Vaping Use   Vaping status: Never Used  Substance and Sexual Activity   Alcohol use: Not Currently    Comment: occasionally   Drug use: No   Sexual  activity: Not Currently    Birth control/protection: None  Other Topics Concern   Not on file  Social History Narrative   Not on file   Social Drivers of Health   Financial Resource Strain: Low Risk  (08/06/2023)   Overall Financial Resource Strain (CARDIA)    Difficulty of Paying Living Expenses: Not hard at all  Food Insecurity: No Food Insecurity (08/06/2023)   Hunger Vital Sign    Worried About Running Out of Food in the Last Year: Never true    Ran Out of Food in the Last Year: Never true  Transportation Needs: No Transportation Needs (08/06/2023)   PRAPARE - Administrator, Civil Service (Medical): No    Lack of Transportation (Non-Medical): No  Physical Activity: Unknown (08/06/2023)   Exercise Vital Sign    Days of Exercise per Week: Patient declined    Minutes of Exercise per Session: Not on file  Stress: No Stress Concern Present (08/06/2023)   [employer] of Occupational Health - Occupational Stress Questionnaire    Feeling of Stress: Not at all  Social Connections: Moderately Isolated (08/06/2023)   Social Connection and Isolation Panel    Frequency of Communication with Friends and Family: More than three times a week    Frequency of Social Gatherings with Friends and Family: Once a week    Attends Religious Services: Never    Database administrator or Organizations: Yes    Attends Engineer, structural: More than 4 times per year    Marital  Status: Divorced    Tobacco Counseling Counseling given: Not Answered   Clinical Intake:  Pre-visit preparation completed: Yes  Pain : No/denies pain     Diabetes: No  How often do you need to have someone help you when you read instructions, pamphlets, or other written materials from your doctor or pharmacy?: 1 - Never  Interpreter Needed?: No  Information entered by :: Mliss Graff LPN   Activities of Daily Living    08/06/2023    1:18 PM 07/30/2023    9:57 AM  In your present state of health, do you have any difficulty performing the following activities:  Hearing? 0 0  Vision? 0 0  Difficulty concentrating or making decisions? 0 0  Walking or climbing stairs? 0 0  Dressing or bathing? 0 0  Doing errands, shopping? 0 0  Preparing Food and eating ? N N  Using the Toilet? N N  In the past six months, have you accidently leaked urine? N N  Do you have problems with loss of bowel control? N N  Managing your Medications? N N  Managing your Finances? N N  Housekeeping or managing your Housekeeping? N N    Patient Care Team: Mahlon Comer BRAVO, MD as PCP - General (Family Medicine) Obie Princella HERO, MD (Inactive) as Consulting Physician (Gastroenterology) Frutoso Luz, MD as Referring Physician (Allergy) Dannielle Bouchard, DO as Consulting Physician (Obstetrics and Gynecology) Court Pulling, MD as Consulting Physician (Dermatology) Vincente Grip, MD as Consulting Physician (Psychiatry) Alvia Olam BIRCH, RN as [facility] Care Management  Indicate any recent Medical Services you may have received from other than [facility] providers in the past year (date may be approximate).     Assessment:   This is a routine wellness examination for [place].  Hearing/Vision screen Hearing Screening - Comments:: No trouble hearing Vision Screening - Comments:: Up to date [email or web address]   Goals Addressed  This Visit's Progress    Patient Stated        Continue current lifestyle       Depression Screen    08/06/2023    1:19 PM 03/04/2023   12:37 PM 06/20/2022    2:40 PM 08/24/2021   12:57 PM 08/24/2021   12:55 PM 03/13/2021   10:08 AM 08/04/2020    8:55 AM  PHQ 2/9 Scores  PHQ - 2 Score 4 6 2  0 0 2 4  PHQ- 9 Score 6 12 3   5 12     Fall Risk    08/06/2023    1:16 PM 07/30/2023    9:57 AM 03/04/2023   12:37 PM 08/31/2022   12:35 PM 08/24/2021   12:57 PM  Fall Risk   Falls in the past year? 0 0 0 0 1  Number falls in past yr: 0  0 0 0  Injury with Fall? 1  0 0 1  Risk for fall due to :   No Fall Risks No Fall Risks   Follow up Falls evaluation completed;Education provided;Falls prevention discussed  Falls evaluation completed Falls evaluation completed Falls evaluation completed;Education provided      Data saved with a previous flowsheet row definition    MEDICARE RISK AT HOME: Medicare Risk at Home Any stairs in or around the home?: No Home free of loose throw rugs in walkways, pet beds, electrical cords, etc?: Yes Adequate lighting in your home to reduce risk of falls?: Yes Life alert?: No Use of a cane, walker or w/c?: No Grab bars in the bathroom?: No Shower chair or bench in shower?: Yes Elevated toilet seat or a handicapped toilet?: No  TIMED UP AND GO:  Was the test performed?  No    Cognitive Function:        08/06/2023    1:17 PM 06/20/2022    2:32 PM  6CIT Screen  What Year? 0 points 0 points  What month? 0 points 0 points  What time? 0 points 0 points  Count back from 20 0 points 0 points  Months in reverse 0 points 0 points  Repeat phrase 0 points 0 points  Total Score 0 points 0 points    Immunizations Immunization History  Administered Date(s) Administered   Influenza Split 10/30/2021   Influenza, High Dose Seasonal PF 11/16/2014, 01/09/2016, 11/16/2016, 11/12/2018, 12/16/2018, 12/15/2019, 03/09/2021   Influenza, Seasonal, Injecte, Preservative Fre 01/07/2012   Influenza,inj,Quad PF,6+ Mos  10/27/2015, 11/27/2017   Influenza-Unspecified 10/07/2014, 11/16/2016, 10/14/2019, 11/01/2019, 11/09/2020, 11/05/2021, 10/22/2022   PFIZER(Purple Top)SARS-COV-2 Vaccination 03/14/2019, 04/04/2019, 06/04/2019, 11/10/2019, 12/15/2019, 11/09/2020, 11/03/2021   Pfizer(Comirnaty)Fall Seasonal Vaccine 12 years and older 10/22/2022, 07/23/2023   Pneumococcal Conjugate,unspecified 02/05/2021   Pneumococcal Conjugate-13 11/12/2018   Pneumococcal Polysaccharide-23 12/16/2018, 06/04/2019, 12/15/2019, 03/09/2021   Respiratory Syncytial Virus Vaccine,Recomb Aduvanted(Arexvy) 11/10/2021   Td 10/30/2021   Tdap 05/16/2011   Unspecified SARS-COV-2 Vaccination 03/14/2019, 04/04/2019, 11/10/2019   Zoster Recombinant(Shingrix ) 06/07/2016, 12/10/2016   Zoster, Live 02/27/2006, 10/14/2017    TDAP status: Up to date  Flu Vaccine status: Up to date  Pneumococcal vaccine status: Up to date  Covid-19 vaccine status: Completed vaccines  Qualifies for Shingles Vaccine? No   Zostavax completed Yes   Shingrix  Completed?: Yes  Screening Tests Health Maintenance  Topic Date Due   INFLUENZA VACCINE  09/06/2023   COVID-19 Vaccine (13 - 2024-25 season) 09/17/2023   MAMMOGRAM  10/17/2023   Colonoscopy  01/05/2024   Medicare Annual Wellness (AWV)  08/05/2024  DTaP/Tdap/Td (3 - Td or Tdap) 10/31/2031   Pneumococcal Vaccine: 50+ Years  Completed   DEXA SCAN  Completed   Hepatitis C Screening  Completed   Zoster Vaccines- Shingrix   Completed   Hepatitis B Vaccines  Aged Out   HPV VACCINES  Aged Out   Meningococcal B Vaccine  Aged Out    Health Maintenance  There are no preventive care reminders to display for this patient.   Colorectal cancer screening: Referral to GI placed  . Pt aware the office will call re: appt.  Mammogram status: Completed  . Repeat every year  Bone Density status: Completed  . Results reflect: Bone density results: OSTEOPENIA. Repeat every 2 years.  Lung Cancer Screening:  (Low Dose CT Chest recommended if Age 48-80 years, 20 pack-year currently smoking OR have quit w/in 15years.) does not qualify.   Lung Cancer Screening Referral:   Additional Screening:  Hepatitis C Screening: does not qualify; Completed 2014  Vision Screening: Recommended annual ophthalmology exams for early detection of glaucoma and other disorders of the eye. Is the patient up to date with their annual eye exam?  Yes  Who is the provider or what is the name of the office in which the patient attends annual eye exams? [email or web address] If pt is not established with a provider, would they like to be referred to a provider to establish care? No .   Dental Screening: Recommended annual dental exams for proper oral hygiene    Community Resource Referral / Chronic Care Management: CRR required this visit?  No   CCM required this visit?  No     Plan:     I have personally reviewed and noted the following in the patient's chart:   Medical and social history Use of alcohol, tobacco or illicit drugs  Current medications and supplements including opioid prescriptions. Patient is not currently taking opioid prescriptions. Functional ability and status Nutritional status Physical activity Advanced directives List of other physicians Hospitalizations, surgeries, and ER visits in previous 12 months Vitals Screenings to include cognitive, depression, and falls Referrals and appointments  In addition, I have reviewed and discussed with patient certain preventive protocols, quality metrics, and best practice recommendations. A written personalized care plan for preventive services as well as general preventive health recommendations were provided to patient.     Mliss Graff, LPN   03/15/7972   After Visit Summary: (MyChart) Due to this being a telephonic visit, the after visit summary with patients personalized plan was offered to patient via MyChart   Nurse Notes:

## 2023-08-06 NOTE — Patient Instructions (Signed)
 [name] , Thank you for taking time to come for your Medicare Wellness Visit. I appreciate your ongoing commitment to your health goals. Please review the following plan we discussed and let me know if I can assist you in the future.   Screening recommendations/referrals: Colonoscopy: ordered Mammogram: up to date Bone Density: up to date Recommended yearly ophthalmology/optometry visit for glaucoma screening and checkup Recommended yearly dental visit for hygiene and checkup  Vaccinations: Influenza vaccine: up to date Pneumococcal vaccine: up to date Tdap vaccine: up to date Shingles vaccine: up to date      Preventive Care 65 Years and Older, Female Preventive care refers to lifestyle choices and visits with your health care provider that can promote health and wellness. What does preventive care include? A yearly physical exam. This is also called an annual well check. Dental exams once or twice a year. Routine eye exams. Ask your health care provider how often you should have your eyes checked. Personal lifestyle choices, including: Daily care of your teeth and gums. Regular physical activity. Eating a healthy diet. Avoiding tobacco and drug use. Limiting alcohol use. Practicing safe sex. Taking low-dose aspirin every day. Taking vitamin and mineral supplements as recommended by your health care provider. What happens during an annual well check? The services and screenings done by your health care provider during your annual well check will depend on your age, overall health, lifestyle risk factors, and family history of disease. Counseling  Your health care provider may ask you questions about your: Alcohol use. Tobacco use. Drug use. Emotional well-being. Home and relationship well-being. Sexual activity. Eating habits. History of falls. Memory and ability to understand (cognition). Work and work Astronomer. Reproductive health. Screening  You may have  the following tests or measurements: Height, weight, and BMI. Blood pressure. Lipid and cholesterol levels. These may be checked every 5 years, or more frequently if you are over 20 years old. Skin check. Lung cancer screening. You may have this screening every year starting at age 64 if you have a 30-pack-year history of smoking and currently smoke or have quit within the past 15 years. Fecal occult blood test (FOBT) of the stool. You may have this test every year starting at age 76. Flexible sigmoidoscopy or colonoscopy. You may have a sigmoidoscopy every 5 years or a colonoscopy every 10 years starting at age 110. Hepatitis C blood test. Hepatitis B blood test. Sexually transmitted disease (STD) testing. Diabetes screening. This is done by checking your blood sugar (glucose) after you have not eaten for a while (fasting). You may have this done every 1-3 years. Bone density scan. This is done to screen for osteoporosis. You may have this done starting at age 72. Mammogram. This may be done every 1-2 years. Talk to your health care provider about how often you should have regular mammograms. Talk with your health care provider about your test results, treatment options, and if necessary, the need for more tests. Vaccines  Your health care provider may recommend certain vaccines, such as: Influenza vaccine. This is recommended every year. Tetanus, diphtheria, and acellular pertussis (Tdap, Td) vaccine. You may need a Td booster every 10 years. Zoster vaccine. You may need this after age 50. Pneumococcal 13-valent conjugate (PCV13) vaccine. One dose is recommended after age 47. Pneumococcal polysaccharide (PPSV23) vaccine. One dose is recommended after age 17. Talk to your health care provider about which screenings and vaccines you need and how often you need them. This information is  not intended to replace advice given to you by your health care provider. Make sure you discuss any questions  you have with your health care provider. Document Released: 02/18/2015 Document Revised: 10/12/2015 Document Reviewed: 11/23/2014 Elsevier Interactive Patient Education  2017 [employer].  Fall Prevention in the Home Falls can cause injuries. They can happen to people of all ages. There are many things you can do to make your home safe and to help prevent falls. What can I do on the outside of my home? Regularly fix the edges of walkways and driveways and fix any cracks. Remove anything that might make you trip as you walk through a door, such as a raised step or threshold. Trim any bushes or trees on the path to your home. Use bright outdoor lighting. Clear any walking paths of anything that might make someone trip, such as rocks or tools. Regularly check to see if handrails are loose or broken. Make sure that both sides of any steps have handrails. Any raised decks and porches should have guardrails on the edges. Have any leaves, snow, or ice cleared regularly. Use sand or salt on walking paths during winter. Clean up any spills in your garage right away. This includes oil or grease spills. What can I do in the bathroom? Use night lights. Install grab bars by the toilet and in the tub and shower. Do not use towel bars as grab bars. Use non-skid mats or decals in the tub or shower. If you need to sit down in the shower, use a plastic, non-slip stool. Keep the floor dry. Clean up any water that spills on the floor as soon as it happens. Remove soap buildup in the tub or shower regularly. Attach bath mats securely with double-sided non-slip rug tape. Do not have throw rugs and other things on the floor that can make you trip. What can I do in the bedroom? Use night lights. Make sure that you have a light by your bed that is easy to reach. Do not use any sheets or blankets that are too big for your bed. They should not hang down onto the floor. Have a firm chair that has side arms. You  can use this for support while you get dressed. Do not have throw rugs and other things on the floor that can make you trip. What can I do in the kitchen? Clean up any spills right away. Avoid walking on wet floors. Keep items that you use a lot in easy-to-reach places. If you need to reach something above you, use a strong step stool that has a grab bar. Keep electrical cords out of the way. Do not use floor polish or wax that makes floors slippery. If you must use wax, use non-skid floor wax. Do not have throw rugs and other things on the floor that can make you trip. What can I do with my stairs? Do not leave any items on the stairs. Make sure that there are handrails on both sides of the stairs and use them. Fix handrails that are broken or loose. Make sure that handrails are as long as the stairways. Check any carpeting to make sure that it is firmly attached to the stairs. Fix any carpet that is loose or worn. Avoid having throw rugs at the top or bottom of the stairs. If you do have throw rugs, attach them to the floor with carpet tape. Make sure that you have a light switch at the top of the  stairs and the bottom of the stairs. If you do not have them, ask someone to add them for you. What else can I do to help prevent falls? Wear shoes that: Do not have high heels. Have rubber bottoms. Are comfortable and fit you well. Are closed at the toe. Do not wear sandals. If you use a stepladder: Make sure that it is fully opened. Do not climb a closed stepladder. Make sure that both sides of the stepladder are locked into place. Ask someone to hold it for you, if possible. Clearly mark and make sure that you can see: Any grab bars or handrails. First and last steps. Where the edge of each step is. Use tools that help you move around (mobility aids) if they are needed. These include: Canes. Walkers. Scooters. Crutches. Turn on the lights when you go into a dark area. Replace any  light bulbs as soon as they burn out. Set up your furniture so you have a clear path. Avoid moving your furniture around. If any of your floors are uneven, fix them. If there are any pets around you, be aware of where they are. Review your medicines with your doctor. Some medicines can make you feel dizzy. This can increase your chance of falling. Ask your doctor what other things that you can do to help prevent falls. This information is not intended to replace advice given to you by your health care provider. Make sure you discuss any questions you have with your health care provider. Document Released: 11/18/2008 Document Revised: 06/30/2015 Document Reviewed: 02/26/2014 Elsevier Interactive Patient Education  2017 [employer].

## 2023-08-07 ENCOUNTER — Encounter: Payer: Medicare PPO | Admitting: Family Medicine

## 2023-08-07 DIAGNOSIS — J3089 Other allergic rhinitis: Secondary | ICD-10-CM | POA: Diagnosis not present

## 2023-08-07 DIAGNOSIS — J301 Allergic rhinitis due to pollen: Secondary | ICD-10-CM | POA: Diagnosis not present

## 2023-08-07 DIAGNOSIS — J3081 Allergic rhinitis due to animal (cat) (dog) hair and dander: Secondary | ICD-10-CM | POA: Diagnosis not present

## 2023-08-13 DIAGNOSIS — J3081 Allergic rhinitis due to animal (cat) (dog) hair and dander: Secondary | ICD-10-CM | POA: Diagnosis not present

## 2023-08-13 DIAGNOSIS — J3089 Other allergic rhinitis: Secondary | ICD-10-CM | POA: Diagnosis not present

## 2023-08-13 DIAGNOSIS — J301 Allergic rhinitis due to pollen: Secondary | ICD-10-CM | POA: Diagnosis not present

## 2023-08-23 ENCOUNTER — Encounter: Payer: Self-pay | Admitting: Family Medicine

## 2023-08-26 ENCOUNTER — Ambulatory Visit: Admitting: Family Medicine

## 2023-08-26 DIAGNOSIS — J3089 Other allergic rhinitis: Secondary | ICD-10-CM | POA: Diagnosis not present

## 2023-08-26 DIAGNOSIS — J301 Allergic rhinitis due to pollen: Secondary | ICD-10-CM | POA: Diagnosis not present

## 2023-08-26 DIAGNOSIS — J3081 Allergic rhinitis due to animal (cat) (dog) hair and dander: Secondary | ICD-10-CM | POA: Diagnosis not present

## 2023-08-29 ENCOUNTER — Other Ambulatory Visit: Payer: Self-pay

## 2023-08-29 ENCOUNTER — Emergency Department (HOSPITAL_BASED_OUTPATIENT_CLINIC_OR_DEPARTMENT_OTHER)
Admission: EM | Admit: 2023-08-29 | Discharge: 2023-08-29 | Disposition: A | Discharge status: Home / Self Care | Attending: Emergency Medicine | Admitting: Emergency Medicine

## 2023-08-29 ENCOUNTER — Ambulatory Visit
Admission: RE | Admit: 2023-08-29 | Discharge: 2023-08-29 | Disposition: A | Discharge status: Home / Self Care | Source: Ambulatory Visit | Attending: Family Medicine | Admitting: Family Medicine

## 2023-08-29 ENCOUNTER — Encounter (HOSPITAL_BASED_OUTPATIENT_CLINIC_OR_DEPARTMENT_OTHER): Payer: Self-pay

## 2023-08-29 VITALS — BP 130/88 | HR 94 | Temp 98.1°F | Resp 20

## 2023-08-29 DIAGNOSIS — S61451A Open bite of right hand, initial encounter: Secondary | ICD-10-CM | POA: Insufficient documentation

## 2023-08-29 DIAGNOSIS — L089 Local infection of the skin and subcutaneous tissue, unspecified: Secondary | ICD-10-CM | POA: Diagnosis not present

## 2023-08-29 DIAGNOSIS — M79641 Pain in right hand: Secondary | ICD-10-CM

## 2023-08-29 DIAGNOSIS — W5501XA Bitten by cat, initial encounter: Secondary | ICD-10-CM

## 2023-08-29 MED ORDER — DOXYCYCLINE HYCLATE 100 MG PO CAPS: 100.0000 mg | ORAL_CAPSULE | Freq: Two times a day (BID) | ORAL | 0 refills | Status: DC

## 2023-08-29 MED ORDER — ACETAMINOPHEN 500 MG PO TABS
1000.0000 mg | ORAL_TABLET | Freq: Once | ORAL | Status: AC
  Administered 2023-08-29: 1000 mg via ORAL
  Filled 2023-08-29: qty 2

## 2023-08-29 MED ORDER — METRONIDAZOLE 500 MG PO TABS
500.0000 mg | ORAL_TABLET | Freq: Once | ORAL | Status: AC
  Administered 2023-08-29: 500 mg via ORAL
  Filled 2023-08-29: qty 1

## 2023-08-29 MED ORDER — DOXYCYCLINE HYCLATE 100 MG PO TABS
100.0000 mg | ORAL_TABLET | Freq: Once | ORAL | Status: AC
  Administered 2023-08-29: 100 mg via ORAL
  Filled 2023-08-29: qty 1

## 2023-08-29 MED ORDER — METRONIDAZOLE 500 MG PO TABS: 500.0000 mg | ORAL_TABLET | Freq: Two times a day (BID) | ORAL | 0 refills | Status: DC

## 2023-08-29 NOTE — ED Provider Notes (Signed)
 [facility]  Note:  This document was prepared using Conservation officer, historic buildings and may include unintentional dictation errors.  MRN: [id number] DOB: 19-Feb-1955  Subjective:   Marcia Adams is a 68 y.o. female presenting for 1 day history of rapidly worsening right hand pain, redness, swelling following a cat bite.  This is the second injury that the patient suffers from the same.  Has undergone its vaccinations and has been checked by animal control.  Patient plans on surrendering the cat due to its aggressive behavior.  Patient has kept the wound covered with a nonbreathable Band-Aid.  No current facility-administered medications for this encounter.  Current Outpatient Medications:    acyclovir  (ZOVIRAX ) 400 MG tablet, , Disp: , Rfl:    AUVELITY 45-105 MG TBCR, Take by mouth., Disp: , Rfl:    Azelastine-Fluticasone 137-50 MCG/ACT SUSP, Place into the nose., Disp: , Rfl:    cetirizine (ZYRTEC ALLERGY) 10 MG tablet, Take 10 mg by mouth at bedtime., Disp: , Rfl:    clindamycin (CLINDAGEL) 1 % gel, Apply topically daily. , Disp: , Rfl:    COMIRNATY syringe, , Disp: , Rfl:    Doxepin HCl 6 MG TABS, , Disp: , Rfl:    doxycycline  (VIBRAMYCIN ) 100 MG capsule, Take 1 capsule (100 mg total) by mouth 2 (two) times daily., Disp: 20 capsule, Rfl: 0   EPIPEN 2-PAK 0.3 MG/0.3ML DEVI, Inject 0.3 mg into the muscle daily as needed (allergic reaction)., Disp: , Rfl:    esomeprazole  (NEXIUM ) 40 MG capsule, Take 1 capsule (40 mg total) by mouth daily at 12 noon., Disp: 90 capsule, Rfl: 3   Eszopiclone 3 MG TABS, Take 3 mg by mouth at bedtime. Take immediately before bedtime, Disp: , Rfl:    fluticasone (FLONASE) 50 MCG/ACT nasal spray, SMARTSIG:1-2 Spray(s) Both Nares Daily, Disp: , Rfl:    fluticasone-salmeterol (ADVAIR) 250-50 MCG/ACT AEPB, Inhale 1 puff into the lungs in the morning and at bedtime., Disp: , Rfl:    levothyroxine  (SYNTHROID ) 88 MCG tablet, Take 1 tablet  (88 mcg total) by mouth daily., Disp: 90 tablet, Rfl: 0   loratadine  (CLARITIN ) 10 MG tablet, Take 10 mg by mouth at bedtime., Disp: , Rfl:    Magnesium  400 MG CAPS, Take by mouth., Disp: , Rfl:    montelukast (SINGULAIR) 10 MG tablet, TK 1 T PO QD IN THE EVE, Disp: , Rfl: 3   simvastatin  (ZOCOR ) 20 MG tablet, Take 1 tablet (20 mg total) by mouth at bedtime., Disp: 90 tablet, Rfl: 3   spironolactone (ALDACTONE) 50 MG tablet, Take 50 mg by mouth 2 (two) times daily. , Disp: , Rfl:    tazarotene  (AVAGE ) 0.1 % cream, Apply topically at bedtime., Disp: , Rfl:    valACYclovir (VALTREX) 500 MG tablet, Take 500 mg by mouth daily., Disp: , Rfl:    Allergies  Allergen Reactions   Ciprofloxacin  Other (See Comments)    Retinal detachment, severe muscle pain; Pt got tendonitis    Penicillins Itching   Aripiprazole Other (See Comments)    Excessive weight gain   Aripiprazole     Excessive weight gain   Escitalopram Oxalate Other (See Comments)    Per pt my legs hurt   Levocetirizine Dihydrochloride     Other reaction(s): unknown reaction   Quinolones    Enalapril Rash    Past Medical History:  Diagnosis Date   Allergic rhinitis    Allergy    Anemia  Anxiety    Asthma    Depression    Diverticulitis    Esophageal stricture    GERD (gastroesophageal reflux disease)    Heart murmur    mild since birth   Hiatal hernia    Hyperlipidemia    Hypertension    Hypothyroidism    Insomnia    Kidney stones    Menopause    Renal insufficiency    Vitamin D  deficiency      Past Surgical History:  Procedure Laterality Date   BREAST BIOPSY Left 10/25/2022   MM LT BREAST BX W LOC DEV 1ST LESION IMAGE BX SPEC STEREO GUIDE 10/25/2022 [facility]   COLONOSCOPY  01/05/2019   [facility]. Dr. Rosalie: Internal hemorrhoids, diverticulosis in the sigmoid colon through hepatic flexure.  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  Pt not to lye completely flat.   STOMACH SURGERY     UPPER GASTROINTESTINAL ENDOSCOPY      Family History  Problem Relation Age of Onset   COPD Mother    Congestive Heart Failure Mother    Dementia Mother    COPD Father    Congestive Heart Failure Father    Depression Brother    Urolithiasis Brother    Arthritis Maternal Grandmother    Kidney cancer Maternal Grandfather    Eczema Daughter    Colon cancer Neg Hx    Esophageal cancer Neg Hx    Rectal cancer Neg Hx    Stomach cancer Neg Hx     Social History   Tobacco Use   Smoking status: Never   Smokeless tobacco: Never  Vaping Use   Vaping status: Never Used  Substance Use Topics   Alcohol use: Not Currently   Drug use: No    ROS   Objective:   Vitals: BP 130/88 (BP Location: Right Arm)   Pulse 94   Temp 98.1 F (36.7 C) (Oral)   Resp 20   SpO2 96%   Physical Exam Constitutional:      General: She is not in acute distress.    Appearance: Normal appearance. She is well-developed. She is not ill-appearing, toxic-appearing or diaphoretic.  HENT:     Head: Normocephalic and atraumatic.     Nose: Nose normal.     Mouth/Throat:     Mouth: Mucous membranes are moist.  Eyes:     General: No scleral icterus.       Right eye: No discharge.        Left eye: No discharge.     Extraocular Movements: Extraocular movements intact.  Cardiovascular:     Rate and Rhythm: Normal rate.  Pulmonary:     Effort: Pulmonary effort is normal.  Musculoskeletal:     Right hand: Swelling (1+ over dorsal aspect of right hand) and tenderness (over dorsal aspect of  the right hand with 2 obvious puncture wounds proximally) present. No deformity, lacerations or bony tenderness. Decreased range of motion. Normal strength. Normal sensation. Normal capillary refill.  Skin:  General: Skin is warm and dry.  Neurological:     General: No focal deficit present.     Mental Status: She is alert and oriented to person, place, and time.  Psychiatric:        Mood and Affect: Mood normal.        Behavior: Behavior normal.      Dressing applied to the right hand using bacitracin, nonadherent covered with Coban.  Assessment and Plan :   PDMP not reviewed this encounter.  1. Right hand pain   2. Cat bite of right hand with infection, initial encounter    Due to medication allergies will use double coverage with doxycycline  and metronidazole .  Discussed wound dressings.  Follow-up with your hand specialist as soon as possible.  Counseled patient on potential for adverse effects with medications prescribed/recommended today, ER and return-to-clinic precautions discussed, patient verbalized understanding.    Christopher Savannah, [place] 08/29/23 [phone]

## 2023-08-29 NOTE — ED Triage Notes (Signed)
 Pt presents with reports of a cat bite. Pt reports that her cat bit her yesterday on the right hand. Redness and swelling noted. Also reports headache.

## 2023-08-29 NOTE — ED Triage Notes (Signed)
 Pt reports her cat bit her right hand yesterday-c/o redness, swelling, painful-NAD-steady gait

## 2023-08-29 NOTE — Discharge Instructions (Addendum)
 You were seen here today for your cat bite.  You were given your first dose of doxycycline  and metronidazole  here.  Take the doxycycline  and metronidazole  as prescribed by urgent care, please take your next dose this evening.  Take the full course of this antibiotic even if you start feeling better.  You may take up to 1000mg  of tylenol  every 6 hours as needed for pain.  Do not take more then 4g per day.  Return immediately to the emergency room if you develop any fevers, red streaking up the arm, very fast spreading redness, if symptoms have not improved within the next 48 hours, any other new or concerning symptoms

## 2023-08-29 NOTE — ED Provider Notes (Cosign Needed Addendum)
 [facility] Provider Note   CSN: [id number] Arrival date & time: 08/29/23  1254     Patient presents with: Animal Bite   Marcia Adams is a 68 y.o. female presents with concern for a cat bite to the dorsum of her right hand that occurred yesterday evening.  States it started to get red and swollen this morning.  She fever chills.  Still able to move her wrist and fingers without difficulty.  Reports cat is up-to-date on vaccinations. Cat is her own cat. Was prescribed antibiotics by urgent care this morning but has not been able to pick them up yet.     Animal Bite Associated symptoms: no fever        Prior to Admission medications   Medication Sig Start Date End Date Taking? Authorizing Provider  acyclovir  (ZOVIRAX ) 400 MG tablet  05/09/21   [provider]  AUVELITY 45-105 MG TBCR Take by mouth. 02/21/23   [provider]  Azelastine-Fluticasone 137-50 MCG/ACT SUSP Place into the nose.    [provider]  cetirizine (ZYRTEC ALLERGY) 10 MG tablet Take 10 mg by mouth at bedtime.    [provider]  clindamycin (CLINDAGEL) 1 % gel Apply topically daily.     [provider]  COMIRNATY syringe  10/22/22   [provider]  Doxepin HCl 6 MG TABS  06/06/20   [provider]  doxycycline  (VIBRAMYCIN ) 100 MG capsule Take 1 capsule (100 mg total) by mouth 2 (two) times daily. 08/29/23   Christopher Savannah, PA-C  EPIPEN 2-PAK 0.3 MG/0.3ML DEVI Inject 0.3 mg into the muscle daily as needed (allergic reaction). 06/02/11   [provider]  esomeprazole  (NEXIUM ) 40 MG capsule Take 1 capsule (40 mg total) by mouth daily at 12 noon. 12/13/22   Cirigliano, Vito V, DO  Eszopiclone 3 MG TABS Take 3 mg by mouth at bedtime. Take immediately before bedtime    [provider]  fluticasone (FLONASE) 50 MCG/ACT nasal spray SMARTSIG:1-2 Spray(s) Both Nares Daily 08/15/21   [provider]  fluticasone-salmeterol (ADVAIR) 250-50 MCG/ACT AEPB Inhale 1 puff into the lungs in the morning and at bedtime.    [provider]  levothyroxine  (SYNTHROID ) 88 MCG tablet Take 1 tablet (88 mcg total) by mouth daily. 06/21/23   Tabori, Katherine E, MD  loratadine  (CLARITIN ) 10 MG tablet Take 10 mg by mouth at bedtime.    [provider]  Magnesium  400 MG CAPS Take by mouth.    [provider]  metroNIDAZOLE  (FLAGYL ) 500 MG tablet Take 1 tablet (500 mg total) by mouth 2 (two) times daily. 08/29/23   Christopher Savannah, PA-C  montelukast (SINGULAIR) 10 MG tablet TK 1 T PO QD IN THE EVE 07/22/16   [provider]  simvastatin  (ZOCOR ) 20 MG tablet Take 1 tablet (20 mg total) by mouth at bedtime. 09/03/22   Tabori, Katherine E, MD  spironolactone (ALDACTONE) 50 MG tablet Take 50 mg by mouth 2 (two) times daily.     [provider]  tazarotene  (AVAGE ) 0.1 % cream Apply topically at bedtime.    [provider]  valACYclovir (VALTREX) 500 MG tablet Take 500 mg by mouth daily.    [provider]  azelastine (ASTELIN) 137 MCG/SPRAY nasal spray One spray in each nostril twice daily as needed for allergies.   04/24/11  [provider]    Allergies: Ciprofloxacin , Penicillins, Aripiprazole, Aripiprazole, Escitalopram oxalate, Levocetirizine dihydrochloride, Quinolones,  and Enalapril    Review of Systems  Constitutional:  Negative for fever.    Updated Vital Signs BP (!) 144/113 (BP Location: Left Arm)   Pulse 90   Temp 98.8 F (37.1 C) (Oral)   Resp 16   SpO2 98%   Physical Exam Vitals and nursing note reviewed.  Constitutional:      Appearance: Normal appearance.  HENT:     Head: Atraumatic.  Cardiovascular:     Rate and Rhythm: Normal rate and regular rhythm.     Comments: Radial pulse 2+ right upper extremity Pulmonary:     Effort: Pulmonary effort is normal.  Musculoskeletal:     Comments: Able to fully flex and extend the  right wrist, able to fully flex and extend the 1st through 5th MCPs, PIPs, DIP joints of the right hand  Skin:    Comments: Erythema and edema to the dorsum of the right hand. Punctate bite marks noted.  No drainage from the sites.  No areas of fluctuance noted.  No red streaking up the arm.    Neurological:     General: No focal deficit present.     Mental Status: She is alert.  Psychiatric:        Mood and Affect: Mood normal.        Behavior: Behavior normal.     (all labs ordered are listed, but only abnormal results are displayed) Labs Reviewed - No data to display  EKG: None  Radiology: No results found.   Procedures   Medications Ordered in the ED  acetaminophen  (TYLENOL ) tablet 1,000 mg (1,000 mg Oral Given 08/29/23 1334)  metroNIDAZOLE  (FLAGYL ) tablet 500 mg (500 mg Oral Given 08/29/23 1334)  doxycycline  (VIBRA -TABS) tablet 100 mg (100 mg Oral Given 08/29/23 1334)                                    Medical Decision Making Risk OTC drugs. Prescription drug management.     Differential diagnosis includes but is not limited to cat bite, cellulitis, sepsis, flexor tenosynovitis  ED Course:  Upon initial evaluation, patient is well-appearing, stable vital signs.  Afebrile, not tachycardic. Low concern for systemic infection/sepsis at this time.  No indication for labs.  She has localized edema and erythema to the dorsum of the right hand where she was bitten by her cat yesterday.  No red streaking up the arm.  She did not have any swelling or edema into the fingers, full range of motion of the 1st through 5th digits of the right hand, low concern for flexor tenosynovitis. No areas of fluctuance, no concern for abscess. Do not feel she needs IV antibiotics or admission at this time, but will give first dose of PO antibiotics here.   Medications Given: Doxycycline  Metronidazole  Tylenol    Impression: Cat bite to right hand  Disposition:  The patient was  discharged home with instructions to take course of metronidazole  and doxycycline  as already prescribed by urgent care.  She does have a penicillin allergy listed, so must avoid Augmentin.  May take Tylenol  as needed for pain. Return precautions given and patient verbalized understanding.     Record Review: External records from outside source obtained and reviewed including urgent care note from earlier today where she was prescribed metronidazole  doxycycline  for her cat bite.     This chart was dictated using voice recognition software, Dragon. Despite the best efforts  of this provider to proofread and correct errors, errors may still occur which can change documentation meaning.       Final diagnoses:  Cat bite of right hand, initial encounter    ED Discharge Orders     None          Veta Palma, PA-C 08/29/23 1334    Veta Palma, PA-C 08/29/23 1341    Randol Simmonds, MD 08/30/23 [phone]

## 2023-09-09 ENCOUNTER — Other Ambulatory Visit: Payer: Self-pay | Admitting: Family Medicine

## 2023-09-10 ENCOUNTER — Ambulatory Visit: Payer: Self-pay | Admitting: *Deleted

## 2023-09-10 NOTE — Telephone Encounter (Signed)
 FYI Only or Action Required?: FYI only for provider.  Patient was last seen in primary care on 03/04/2023 by Mahlon Comer BRAVO, MD.  Called Nurse Triage reporting Fatigue.  Symptoms began several weeks ago.  Interventions attempted: Rest, hydration, or home remedies.  Symptoms are: gradually worsening.  Triage Disposition: See HCP Within 4 Hours (Or PCP Triage)  Patient/caregiver understands and will follow disposition?: Patient declines appointment today- has scheduled appointment for tomorrow.  Reason for Disposition  [1] MODERATE weakness (e.g., interferes with work, school, normal activities) AND [2] cause unknown  (Exceptions: Weakness from acute minor illness or poor fluid intake; weakness is chronic and not worse.)  Answer Assessment - Initial Assessment Questions 1. DESCRIPTION: Describe how you are feeling.     Exhausted - has to sit to rest on outings, sleeping 9-10 hours at night, general feeling not herself, lightheaded at times 2. SEVERITY: How bad is it?  Can you stand and walk?     Severe tiredness- not able to keep foods down- reflux, has either diarrhea or constipation  3. ONSET: When did these symptoms begin? (e.g., hours, days, weeks, months)     2 weeks 4. CAUSE: What do you think is causing the weakness or fatigue? (e.g., not drinking enough fluids, medical problem, trouble sleeping)     Possible medical problem, hard to hydrate 5. NEW MEDICINES:  Have you started on any new medicines recently? (e.g., opioid pain medicines, benzodiazepines, muscle relaxants, antidepressants, antihistamines, neuroleptics, beta blockers)     Recent antibiotics 6. OTHER SYMPTOMS: Do you have any other symptoms? (e.g., chest pain, fever, cough, SOB, vomiting, diarrhea, bleeding, other areas of pain)     no  Protocols used: Weakness (Generalized) and Fatigue-A-AH   Copied from CRM #[id number]. Topic: Clinical - Red Word Triage >> Sep 10, 2023 12:36 PM Deleta RAMAN  wrote: Red Word that prompted transfer to Nurse Triage: patient is experiencing fatigue, lightheaded, constipation to diarrhea. She does not know if its her thyroids. The symptoms have been for about a week - 2 weeks. Patient has not been able to get anything completed.

## 2023-09-11 ENCOUNTER — Encounter: Payer: Self-pay | Admitting: Family Medicine

## 2023-09-11 ENCOUNTER — Ambulatory Visit (INDEPENDENT_AMBULATORY_CARE_PROVIDER_SITE_OTHER): Admitting: Family Medicine

## 2023-09-11 VITALS — BP 110/68 | HR 87 | Temp 98.2°F | Ht 65.0 in | Wt 190.2 lb

## 2023-09-11 DIAGNOSIS — R5383 Other fatigue: Secondary | ICD-10-CM | POA: Diagnosis not present

## 2023-09-11 DIAGNOSIS — Z0184 Encounter for antibody response examination: Secondary | ICD-10-CM

## 2023-09-11 DIAGNOSIS — E038 Other specified hypothyroidism: Secondary | ICD-10-CM | POA: Diagnosis not present

## 2023-09-11 DIAGNOSIS — J301 Allergic rhinitis due to pollen: Secondary | ICD-10-CM | POA: Diagnosis not present

## 2023-09-11 DIAGNOSIS — J3081 Allergic rhinitis due to animal (cat) (dog) hair and dander: Secondary | ICD-10-CM | POA: Diagnosis not present

## 2023-09-11 DIAGNOSIS — J3089 Other allergic rhinitis: Secondary | ICD-10-CM | POA: Diagnosis not present

## 2023-09-11 DIAGNOSIS — H1045 Other chronic allergic conjunctivitis: Secondary | ICD-10-CM | POA: Insufficient documentation

## 2023-09-11 LAB — CBC WITH DIFFERENTIAL/PLATELET
Basophils Absolute: 0.1 K/uL (ref 0.0–0.1)
Basophils Relative: 1.1 % (ref 0.0–3.0)
Eosinophils Absolute: 0.2 K/uL (ref 0.0–0.7)
Eosinophils Relative: 2.1 % (ref 0.0–5.0)
HCT: 41.7 % (ref 36.0–46.0)
Hemoglobin: 13.6 g/dL (ref 12.0–15.0)
Lymphocytes Relative: 23 % (ref 12.0–46.0)
Lymphs Abs: 2.1 K/uL (ref 0.7–4.0)
MCHC: 32.7 g/dL (ref 30.0–36.0)
MCV: 102.9 fl — ABNORMAL HIGH (ref 78.0–100.0)
Monocytes Absolute: 1 K/uL (ref 0.1–1.0)
Monocytes Relative: 10.5 % (ref 3.0–12.0)
Neutro Abs: 5.8 K/uL (ref 1.4–7.7)
Neutrophils Relative %: 63.3 % (ref 43.0–77.0)
Platelets: 299 K/uL (ref 150.0–400.0)
RBC: 4.05 Mil/uL (ref 3.87–5.11)
RDW: 12.5 % (ref 11.5–15.5)
WBC: 9.2 K/uL (ref 4.0–10.5)

## 2023-09-11 LAB — HEPATIC FUNCTION PANEL
ALT: 26 U/L (ref 0–35)
AST: 24 U/L (ref 0–37)
Albumin: 4.2 g/dL (ref 3.5–5.2)
Alkaline Phosphatase: 129 U/L — ABNORMAL HIGH (ref 39–117)
Bilirubin, Direct: 0.1 mg/dL (ref 0.0–0.3)
Total Bilirubin: 0.5 mg/dL (ref 0.2–1.2)
Total Protein: 6.7 g/dL (ref 6.0–8.3)

## 2023-09-11 LAB — B12 AND FOLATE PANEL
Folate: >23.4 ng/mL (ref >5.9)
Vitamin B-12: 97 pg/mL — ABNORMAL LOW (ref 211–911)

## 2023-09-11 LAB — BASIC METABOLIC PANEL WITH GFR
BUN: 19 mg/dL (ref 6–23)
CO2: 27 meq/L (ref 19–32)
Calcium: 10.1 mg/dL (ref 8.4–10.5)
Chloride: 100 meq/L (ref 96–112)
Creatinine, Ser: 1.04 mg/dL (ref 0.40–1.20)
GFR: 54.69 mL/min — ABNORMAL LOW (ref >60.00)
Glucose, Bld: 88 mg/dL (ref 70–99)
Potassium: 4.1 meq/L (ref 3.5–5.1)
Sodium: 138 meq/L (ref 135–145)

## 2023-09-11 LAB — T4, FREE: Free T4: 0.94 ng/dL (ref 0.60–1.60)

## 2023-09-11 LAB — T3, FREE: T3, Free: 3 pg/mL (ref 2.3–4.2)

## 2023-09-11 LAB — VITAMIN D 25 HYDROXY (VIT D DEFICIENCY, FRACTURES): VITD: 31.12 ng/mL (ref 30.00–100.00)

## 2023-09-11 LAB — TSH: TSH: 5.49 u[IU]/mL (ref 0.35–5.50)

## 2023-09-11 NOTE — Progress Notes (Signed)
   Subjective:    Patient ID: Marcia Adams, female    DOB: 1955/04/11, 68 y.o.   MRN: [id number]  HPI Fatigue- sxs started 2 months ago but have been worsening over the last 2 weeks.  She finds that she now needs to sit when taking a shower/washing her hair.  She is not able to do her grocery shopping w/o taking a break.  Pt reports she is sleeping ~10 hrs/night but is still having to lie down during the day.  Does have hx of hypothyroid.  Cancelled her visit to grandkids b/c she didn't think she would be able to drive 90 minutes.  No fever, sweats/chills.  Pt is not aware if she snores or not.     Review of Systems For ROS see HPI     Objective:   Physical Exam Vitals reviewed.  Constitutional:      General: She is not in acute distress.    Appearance: Normal appearance. She is well-developed. She is not ill-appearing.  HENT:     Head: Normocephalic and atraumatic.  Eyes:     Conjunctiva/sclera: Conjunctivae normal.     Pupils: Pupils are equal, round, and reactive to light.  Neck:     Thyroid : No thyromegaly.  Cardiovascular:     Rate and Rhythm: Normal rate and regular rhythm.     Heart sounds: Normal heart sounds. No murmur heard. Pulmonary:     Effort: Pulmonary effort is normal. No respiratory distress.     Breath sounds: Normal breath sounds.  Abdominal:     General: There is no distension.     Palpations: Abdomen is soft.     Tenderness: There is no abdominal tenderness.  Musculoskeletal:     Cervical back: Normal range of motion and neck supple.  Lymphadenopathy:     Cervical: No cervical adenopathy.  Skin:    General: Skin is warm and dry.  Neurological:     General: No focal deficit present.     Mental Status: She is alert and oriented to person, place, and time.  Psychiatric:        Mood and Affect: Mood normal.        Behavior: Behavior normal.        Thought Content: Thought content normal.           Assessment & Plan:  Fatigue- deteriorated.   Pt has hx of similar but reports this is excessive when compared to previous bouts of fatigue.  Finds that she is no longer able to do her daily tasks or run errands w/o resting.  Is sleeping ~10 hrs/day but still has to rest in the afternoons.  Is cancelling plans due to her fatigue.  Has hx of hypothyroid and depression which could be contributing.  Will check labs to assess for underlying cause and tx any abnormalities if present.  Pt expressed understanding and is in agreement w/ plan.

## 2023-09-11 NOTE — Patient Instructions (Signed)
 Follow up as needed or as scheduled We'll notify you of your lab results and make any changes if needed Continue to eat regularly throughout the day- you need fuel to go! Drink LOTS of water! Rest when you need to! Call with any questions or concerns Hang in there!!!

## 2023-09-12 ENCOUNTER — Ambulatory Visit: Payer: Self-pay | Admitting: Family Medicine

## 2023-09-12 ENCOUNTER — Encounter: Payer: PPO | Admitting: Family Medicine

## 2023-09-12 NOTE — Telephone Encounter (Signed)
 Called patient to schedule B12 injection - LVM to call back to schedule

## 2023-09-12 NOTE — Telephone Encounter (Signed)
 Patient has been scheduled for 8/11 for B12 by [facility]

## 2023-09-13 LAB — ANA: Anti Nuclear Antibody (ANA): NEGATIVE

## 2023-09-13 LAB — MEASLES/MUMPS/RUBELLA IMMUNITY
Mumps IgG: <9 [AU]/ml — ABNORMAL LOW
Rubella: 30 {index}
Rubeola IgG: >300 [AU]/ml

## 2023-09-16 ENCOUNTER — Other Ambulatory Visit: Payer: Self-pay

## 2023-09-16 ENCOUNTER — Ambulatory Visit (INDEPENDENT_AMBULATORY_CARE_PROVIDER_SITE_OTHER)

## 2023-09-16 DIAGNOSIS — E038 Other specified hypothyroidism: Secondary | ICD-10-CM

## 2023-09-16 DIAGNOSIS — R5383 Other fatigue: Secondary | ICD-10-CM

## 2023-09-16 MED ORDER — CYANOCOBALAMIN 1000 MCG/ML IJ SOLN
1000.0000 ug | Freq: Once | INTRAMUSCULAR | Status: AC
  Administered 2023-09-16 (×2): 1000 ug via INTRAMUSCULAR

## 2023-09-16 MED ORDER — LEVOTHYROXINE SODIUM 88 MCG PO TABS: 88.0000 ug | ORAL_TABLET | Freq: Every day | ORAL | 0 refills | Status: DC

## 2023-09-16 NOTE — Progress Notes (Signed)
 Marcia Adams is a 68 y.o. female presents to the office today for B12 injection (#1) per physician's orders. Injection was administered Intramuscular Left deltoid.   Patient's next injection due 8/18, appt made? yes  Burnard Gaskins

## 2023-09-23 ENCOUNTER — Encounter: Payer: Self-pay | Admitting: Family Medicine

## 2023-09-23 ENCOUNTER — Ambulatory Visit (INDEPENDENT_AMBULATORY_CARE_PROVIDER_SITE_OTHER): Admitting: Family Medicine

## 2023-09-23 VITALS — BP 128/78 | HR 78 | Temp 98.0°F | Ht 65.0 in | Wt 191.2 lb

## 2023-09-23 DIAGNOSIS — E538 Deficiency of other specified B group vitamins: Secondary | ICD-10-CM | POA: Diagnosis not present

## 2023-09-23 DIAGNOSIS — Z0000 Encounter for general adult medical examination without abnormal findings: Secondary | ICD-10-CM

## 2023-09-23 DIAGNOSIS — Z23 Encounter for immunization: Secondary | ICD-10-CM

## 2023-09-23 MED ORDER — CYANOCOBALAMIN 1000 MCG/ML IJ SOLN: 1000.0000 ug | Freq: Once | INTRAMUSCULAR | Status: AC

## 2023-09-23 NOTE — Progress Notes (Signed)
   Subjective:    Patient ID: Marcia Adams, female    DOB: 04-09-1955, 68 y.o.   MRN: [id number]  HPI CPE- UTD on colonoscopy, mammo, Tdap, PNA  Patient Care Team    Relationship Specialty Notifications Start End  Mahlon Comer BRAVO, MD PCP - General Family Medicine  10/01/14   Obie Princella HERO, MD (Inactive) Consulting Physician Gastroenterology  09/29/14   Frutoso Luz, MD Referring Physician Allergy  09/29/14   Dannielle Bouchard, DO Consulting Physician Obstetrics and Gynecology  09/29/14   Court Pulling, MD Consulting Physician Dermatology  09/29/14   Vincente Grip, MD Consulting Physician Psychiatry  09/29/14   Alvia Olam BIRCH, RN [facility] Care Management   09/11/21     Health Maintenance  Topic Date Due   COVID-19 Vaccine (13 - 2024-25 season) 09/17/2023   INFLUENZA VACCINE  09/06/2023   Colonoscopy  01/05/2024   MAMMOGRAM  10/17/2023   Medicare Annual Wellness (AWV)  08/05/2024   DTaP/Tdap/Td (3 - Td or Tdap) 10/31/2031   Pneumococcal Vaccine: 50+ Years  Completed   DEXA SCAN  Completed   Hepatitis C Screening  Completed   Zoster Vaccines- Shingrix   Completed   HPV VACCINES  Aged Out   Meningococcal B Vaccine  Aged Out   Pneumococcal Vaccine  Discontinued      Review of Systems Patient reports no vision/ hearing changes, adenopathy,fever, weight change,  persistant/recurrent hoarseness , swallowing issues, chest pain, palpitations, edema, persistant/recurrent cough, hemoptysis, dyspnea (rest/exertional/paroxysmal nocturnal), gastrointestinal bleeding (melena, rectal bleeding), abdominal pain, significant heartburn, bowel changes, GU symptoms (dysuria, hematuria, incontinence), Gyn symptoms (abnormal  bleeding, pain),  syncope, focal weakness, memory loss, numbness & tingling, skin/hair/nail changes, abnormal bruising or bleeding, anxiety, or depression.     Objective:   Physical Exam General Appearance:    Alert, cooperative, no distress, appears stated  age  Head:    Normocephalic, without obvious abnormality, atraumatic  Eyes:    PERRL, conjunctiva/corneas clear, EOM's intact both eyes  Ears:    Normal TM's and external ear canals, both ears  Nose:   Nares normal, septum midline, mucosa normal, no drainage    or sinus tenderness  Throat:   Lips, mucosa, and tongue normal; teeth and gums normal  Neck:   Supple, symmetrical, trachea midline, no adenopathy;    Thyroid : no enlargement/tenderness/nodules  Back:     Symmetric, no curvature, ROM normal, no CVA tenderness  Lungs:     Clear to auscultation bilaterally, respirations unlabored  Chest Wall:    No tenderness or deformity   Heart:    Regular rate and rhythm, S1 and S2 normal, no murmur, rub   or gallop  Breast Exam:    Deferred to GYN  Abdomen:     Soft, non-tender, bowel sounds active all four quadrants,    no masses, no organomegaly  Genitalia:    Deferred to GYN  Rectal:    Extremities:   Extremities normal, atraumatic, no cyanosis or edema  Pulses:   2+ and symmetric all extremities  Skin:   Skin color, texture, turgor normal, no rashes or lesions  Lymph nodes:   Cervical, supraclavicular, and axillary nodes normal  Neurologic:   CNII-XII intact, normal strength, sensation and reflexes    throughout          Assessment & Plan:

## 2023-09-23 NOTE — Patient Instructions (Addendum)
 Follow up in 6 months to recheck cholesterol and thyroid  No need to repeat labs today- yay!!! Keep up the good work on healthy diet and regular exercise- you look great! Call with any questions or concerns Stay safe!  Stay healthy! Enjoy the rest of your summer!!!

## 2023-09-23 NOTE — Assessment & Plan Note (Signed)
 Pt's PE WNL w/ exception of BMI.  UTD on colonoscopy, mammo, Tdap, PNA.  Reviewed recent labs- no need to repeat.  Anticipatory guidance provided.

## 2023-09-27 DIAGNOSIS — J3089 Other allergic rhinitis: Secondary | ICD-10-CM | POA: Diagnosis not present

## 2023-09-27 DIAGNOSIS — J301 Allergic rhinitis due to pollen: Secondary | ICD-10-CM | POA: Diagnosis not present

## 2023-09-27 DIAGNOSIS — J3081 Allergic rhinitis due to animal (cat) (dog) hair and dander: Secondary | ICD-10-CM | POA: Diagnosis not present

## 2023-09-30 ENCOUNTER — Ambulatory Visit

## 2023-10-01 ENCOUNTER — Ambulatory Visit (INDEPENDENT_AMBULATORY_CARE_PROVIDER_SITE_OTHER): Admitting: Family Medicine

## 2023-10-01 DIAGNOSIS — E538 Deficiency of other specified B group vitamins: Secondary | ICD-10-CM

## 2023-10-01 NOTE — Progress Notes (Signed)
 Marcia Adams is a 68 y.o. female presents to the office today for B12 injection per physician's orders. Injection was administered Left deltoid.   Patient's next injection due 1 month, appt made? yes  [name]

## 2023-10-02 ENCOUNTER — Ambulatory Visit (INDEPENDENT_AMBULATORY_CARE_PROVIDER_SITE_OTHER)

## 2023-10-02 ENCOUNTER — Other Ambulatory Visit: Payer: Self-pay | Admitting: Podiatry

## 2023-10-02 ENCOUNTER — Ambulatory Visit: Admitting: Podiatry

## 2023-10-02 DIAGNOSIS — S99922A Unspecified injury of left foot, initial encounter: Secondary | ICD-10-CM

## 2023-10-02 NOTE — Progress Notes (Unsigned)
 Wore a little platform viocin shoe- got hari trimmed and hit unevent spot in parking ltoa and startd to go down; managed to get back up ten started to go right then left and foot was twisted- never did fall; 2 days later it started dnkept getting worse; off today ; swollen yesterday  Occurred last 2ed  Sx shoe- pian 2nd met cu joint Had rx in 2022- white line 3rd not pain there

## 2023-10-08 ENCOUNTER — Ambulatory Visit: Admitting: Family Medicine

## 2023-10-08 DIAGNOSIS — J301 Allergic rhinitis due to pollen: Secondary | ICD-10-CM | POA: Diagnosis not present

## 2023-10-08 DIAGNOSIS — E538 Deficiency of other specified B group vitamins: Secondary | ICD-10-CM | POA: Diagnosis not present

## 2023-10-08 DIAGNOSIS — J3081 Allergic rhinitis due to animal (cat) (dog) hair and dander: Secondary | ICD-10-CM | POA: Diagnosis not present

## 2023-10-08 DIAGNOSIS — J3089 Other allergic rhinitis: Secondary | ICD-10-CM | POA: Diagnosis not present

## 2023-10-08 MED ORDER — CYANOCOBALAMIN 1000 MCG/ML IJ SOLN
1000.0000 ug | Freq: Once | INTRAMUSCULAR | Status: AC
  Administered 2023-10-08: 1000 ug via INTRAMUSCULAR

## 2023-10-08 NOTE — Progress Notes (Signed)
 Marcia Adams is a 68 y.o. female presents to the office today for B12 injection per physician's orders. Injection was administered Intramuscular Right deltoid.   Patient's next injection due 1 wk, appt made? yes  [name]

## 2023-10-09 ENCOUNTER — Encounter: Payer: Self-pay | Admitting: Pharmacist

## 2023-10-09 NOTE — Progress Notes (Signed)
 Pharmacy Quality Measure Review  This patient is appearing on a report for being at risk of failing the adherence measure for cholesterol (statin) medications this calendar year.   Medication: simvastatin  20mg  Last fill date: 06/14/2023 for 90 day supply  Called [facility] and actual last refill date was 09/12/2023 for 90 day supply. Pharmacist verified patient did pick up simvastatin  Rx from 8/7.  Patient has 2 refills remaining. Next appointment with PCP is 03/25/2024.    Insurance report was not up to date. No action needed at this time.   Madelin Ray, PharmD Clinical Pharmacist [facility]

## 2023-10-14 ENCOUNTER — Encounter: Payer: Self-pay | Admitting: Gastroenterology

## 2023-10-24 DIAGNOSIS — Z1231 Encounter for screening mammogram for malignant neoplasm of breast: Secondary | ICD-10-CM | POA: Diagnosis not present

## 2023-10-24 DIAGNOSIS — J3089 Other allergic rhinitis: Secondary | ICD-10-CM | POA: Diagnosis not present

## 2023-10-24 DIAGNOSIS — J301 Allergic rhinitis due to pollen: Secondary | ICD-10-CM | POA: Diagnosis not present

## 2023-10-24 DIAGNOSIS — J3081 Allergic rhinitis due to animal (cat) (dog) hair and dander: Secondary | ICD-10-CM | POA: Diagnosis not present

## 2023-10-24 DIAGNOSIS — Z6832 Body mass index (BMI) 32.0-32.9, adult: Secondary | ICD-10-CM | POA: Diagnosis not present

## 2023-10-24 DIAGNOSIS — Z124 Encounter for screening for malignant neoplasm of cervix: Secondary | ICD-10-CM | POA: Diagnosis not present

## 2023-10-29 ENCOUNTER — Other Ambulatory Visit: Payer: Self-pay | Admitting: Obstetrics & Gynecology

## 2023-10-29 DIAGNOSIS — R928 Other abnormal and inconclusive findings on diagnostic imaging of breast: Secondary | ICD-10-CM

## 2023-11-06 ENCOUNTER — Ambulatory Visit
Admission: RE | Admit: 2023-11-06 | Discharge: 2023-11-06 | Disposition: A | Discharge status: Home / Self Care | Source: Ambulatory Visit | Attending: Obstetrics & Gynecology | Admitting: Obstetrics & Gynecology

## 2023-11-06 DIAGNOSIS — R928 Other abnormal and inconclusive findings on diagnostic imaging of breast: Secondary | ICD-10-CM

## 2023-11-06 DIAGNOSIS — J3089 Other allergic rhinitis: Secondary | ICD-10-CM | POA: Diagnosis not present

## 2023-11-06 DIAGNOSIS — J301 Allergic rhinitis due to pollen: Secondary | ICD-10-CM | POA: Diagnosis not present

## 2023-11-06 DIAGNOSIS — J3081 Allergic rhinitis due to animal (cat) (dog) hair and dander: Secondary | ICD-10-CM | POA: Diagnosis not present

## 2023-11-06 DIAGNOSIS — N6489 Other specified disorders of breast: Secondary | ICD-10-CM | POA: Diagnosis not present

## 2023-11-07 ENCOUNTER — Ambulatory Visit (INDEPENDENT_AMBULATORY_CARE_PROVIDER_SITE_OTHER)

## 2023-11-07 DIAGNOSIS — E538 Deficiency of other specified B group vitamins: Secondary | ICD-10-CM

## 2023-11-07 MED ORDER — CYANOCOBALAMIN 1000 MCG/ML IJ SOLN
1000.0000 ug | Freq: Once | INTRAMUSCULAR | Status: AC
  Administered 2023-11-07: 1000 ug via INTRAMUSCULAR

## 2023-11-07 NOTE — Progress Notes (Signed)
 Marcia Adams is a 68 y.o. female presents to the office today for 1st monthly B12 shot per physician's orders. Injection was administered Intramuscular Left deltoid.   Patient's next injection due 12/08/23, appt made? no  Alfredo DELENA Shope

## 2023-11-19 DIAGNOSIS — J301 Allergic rhinitis due to pollen: Secondary | ICD-10-CM | POA: Diagnosis not present

## 2023-11-19 DIAGNOSIS — J3081 Allergic rhinitis due to animal (cat) (dog) hair and dander: Secondary | ICD-10-CM | POA: Diagnosis not present

## 2023-11-19 DIAGNOSIS — J3089 Other allergic rhinitis: Secondary | ICD-10-CM | POA: Diagnosis not present

## 2023-11-21 ENCOUNTER — Encounter: Payer: Self-pay | Admitting: Family Medicine

## 2023-11-25 ENCOUNTER — Ambulatory Visit (AMBULATORY_SURGERY_CENTER)

## 2023-11-25 VITALS — Ht 64.5 in | Wt 196.8 lb

## 2023-11-25 DIAGNOSIS — Z860100 Personal history of colon polyps, unspecified: Secondary | ICD-10-CM

## 2023-11-25 MED ORDER — NA SULFATE-K SULFATE-MG SULF 17.5-3.13-1.6 GM/177ML PO SOLN: 1.0000 | Freq: Once | ORAL | 0 refills | Status: AC

## 2023-11-25 NOTE — Progress Notes (Signed)
 No issues known to pt with past sedation with any surgeries or procedures Patient denies ever being told they had issues or difficulty with intubation  No FH of Malignant Hyperthermia Pt is not on diet pills nor GLP-1 medications Pt is not on home 02  Pt is not on blood thinners  Pt states issues with chronic constipation; magnesium  helps  No A fib or A flutter Have any cardiac testing pending--no Pt instructed to use Singlecare.com or GoodRx for a price reduction on prep  Ambulates independently

## 2023-11-29 ENCOUNTER — Encounter: Payer: Self-pay | Admitting: Gastroenterology

## 2023-12-04 DIAGNOSIS — J3089 Other allergic rhinitis: Secondary | ICD-10-CM | POA: Diagnosis not present

## 2023-12-04 DIAGNOSIS — J301 Allergic rhinitis due to pollen: Secondary | ICD-10-CM | POA: Diagnosis not present

## 2023-12-04 DIAGNOSIS — J3081 Allergic rhinitis due to animal (cat) (dog) hair and dander: Secondary | ICD-10-CM | POA: Diagnosis not present

## 2023-12-09 ENCOUNTER — Ambulatory Visit (AMBULATORY_SURGERY_CENTER): Admitting: Gastroenterology

## 2023-12-09 ENCOUNTER — Encounter: Payer: Self-pay | Admitting: Gastroenterology

## 2023-12-09 VITALS — BP 137/79 | HR 79 | Temp 98.2°F | Resp 16 | Ht 65.0 in | Wt 196.8 lb

## 2023-12-09 DIAGNOSIS — E039 Hypothyroidism, unspecified: Secondary | ICD-10-CM | POA: Diagnosis not present

## 2023-12-09 DIAGNOSIS — E785 Hyperlipidemia, unspecified: Secondary | ICD-10-CM | POA: Diagnosis not present

## 2023-12-09 DIAGNOSIS — Z1211 Encounter for screening for malignant neoplasm of colon: Secondary | ICD-10-CM | POA: Diagnosis not present

## 2023-12-09 DIAGNOSIS — K5730 Diverticulosis of large intestine without perforation or abscess without bleeding: Secondary | ICD-10-CM

## 2023-12-09 DIAGNOSIS — I10 Essential (primary) hypertension: Secondary | ICD-10-CM | POA: Diagnosis not present

## 2023-12-09 DIAGNOSIS — F419 Anxiety disorder, unspecified: Secondary | ICD-10-CM | POA: Diagnosis not present

## 2023-12-09 DIAGNOSIS — Z860100 Personal history of colon polyps, unspecified: Secondary | ICD-10-CM

## 2023-12-09 DIAGNOSIS — K635 Polyp of colon: Secondary | ICD-10-CM | POA: Diagnosis not present

## 2023-12-09 DIAGNOSIS — Z860101 Personal history of adenomatous and serrated colon polyps: Secondary | ICD-10-CM | POA: Diagnosis not present

## 2023-12-09 DIAGNOSIS — D122 Benign neoplasm of ascending colon: Secondary | ICD-10-CM | POA: Diagnosis not present

## 2023-12-09 DIAGNOSIS — F32A Depression, unspecified: Secondary | ICD-10-CM | POA: Diagnosis not present

## 2023-12-09 MED ORDER — SODIUM CHLORIDE 0.9 % IV SOLN: 500.0000 mL | INTRAVENOUS | Status: DC

## 2023-12-09 NOTE — Progress Notes (Signed)
 Vss nad trans to pacu

## 2023-12-09 NOTE — Progress Notes (Signed)
 Called to room to assist during endoscopic procedure.  Patient ID and intended procedure confirmed with present staff. Received instructions for my participation in the procedure from the performing physician.

## 2023-12-09 NOTE — Progress Notes (Signed)
 Pt's states no medical or surgical changes since previsit or office visit.

## 2023-12-09 NOTE — Progress Notes (Signed)
 GASTROENTEROLOGY PROCEDURE H&P NOTE   Primary Care Physician: Mahlon Comer BRAVO, MD    Reason for Procedure:  Colon polyp surveillance  Plan:    Colonoscopy  Patient is appropriate for endoscopic procedure(s) in the ambulatory ([facility]) setting.  The nature of the procedure, as well as the risks, benefits, and alternatives were carefully and thoroughly reviewed with the patient. Ample time for discussion and questions allowed. The patient understood, was satisfied, and agreed to proceed.     HPI: Marcia Adams is a 68 y.o. female who presents for colonoscopy for ongoing colon polyp surveillance and colon cancer screening.  No active GI symptoms.  No known family history of colon cancer or related malignancy.  Patient is otherwise without complaints or active issues today.  - 02/02/2014: Colonoscopy (Dr. Obie): 10 mm cecal adenoma, pandiverticulosis with associated colonic narrowing, tortuosity, angulation with 31-minute cecal intubation time  - 01/05/2019: Colonoscopy (Dr. Rosalie): Internal hemorrhoids, diverticulosis in the sigmoid colon through hepatic flexure.  Repeat in 5 years   Past Medical History:  Diagnosis Date   Allergic rhinitis    Allergy    Anemia    Anxiety    Asthma    Depression    Diverticulitis    Esophageal stricture    GERD (gastroesophageal reflux disease)    Heart murmur    mild since birth   Hiatal hernia    Hyperlipidemia    Hypertension    Hypothyroidism    Insomnia    Kidney stones    Menopause    Renal insufficiency    Vitamin D  deficiency     Past Surgical History:  Procedure Laterality Date   BREAST BIOPSY Left 10/25/2022   MM LT BREAST BX W LOC DEV 1ST LESION IMAGE BX SPEC STEREO GUIDE 10/25/2022 [facility]   COLONOSCOPY  01/05/2019   [facility]. Dr. Rosalie: Internal hemorrhoids, diverticulosis in the sigmoid colon through hepatic flexure.  Repeat in 5 years   FOOT TENDON SURGERY Left    HEMORRHOID SURGERY      MASS EXCISION  06/08/2011   Procedure: MINOR EXCISION OF MASS;  Surgeon: Lamar LULLA Leonor Mickey., MD;  Location: 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  Pt not to lye completely flat.   STOMACH SURGERY     UPPER GASTROINTESTINAL ENDOSCOPY      Prior to Admission medications   Medication Sig Start Date End Date Taking? Authorizing Provider  acyclovir  (ZOVIRAX ) 400 MG tablet  05/09/21  Yes [provider]  AUVELITY 45-105 MG TBCR Take by mouth. 02/21/23  Yes [provider]  Azelastine-Fluticasone 137-50 MCG/ACT SUSP Place into the nose.   Yes [provider]  Doxepin HCl 6 MG TABS  06/06/20  Yes [provider]  esomeprazole  (NEXIUM ) 40 MG capsule Take 1 capsule (40 mg total) by mouth daily at 12 noon. 12/13/22  Yes Marisa Hage V, DO  Eszopiclone 3 MG TABS Take 3 mg by mouth at bedtime. Take immediately before bedtime   Yes [provider]  fluticasone (FLONASE) 50 MCG/ACT nasal spray SMARTSIG:1-2 Spray(s) Both Nares Daily 08/15/21  Yes [provider]  levothyroxine  (SYNTHROID ) 88 MCG tablet Take 1 tablet (88 mcg total) by mouth daily. 09/16/23  Yes Tabori,  Katherine E, MD  loratadine  (CLARITIN ) 10 MG tablet Take 10 mg by mouth at bedtime.   Yes [provider]  Magnesium  400 MG CAPS Take by mouth.   Yes [provider]  montelukast (SINGULAIR) 10 MG tablet TK 1 T PO QD IN THE EVE 07/22/16  Yes [provider]  simvastatin  (ZOCOR ) 20 MG tablet TAKE 1 TABLET BY MOUTH EVERY NIGHT AT BEDTIME Patient taking differently: Take 10 mg by mouth at bedtime. 09/09/23  Yes Tabori, Katherine E, MD  spironolactone (ALDACTONE) 50 MG tablet Take 50 mg by mouth 2 (two) times daily.    Yes [provider]  valACYclovir (VALTREX) 500 MG tablet Take 500 mg by mouth daily.   Yes [provider]  COMIRNATY syringe  10/22/22   [provider]  EPIPEN 2-PAK 0.3 MG/0.3ML DEVI Inject 0.3 mg into the muscle daily as needed (allergic reaction). Patient not taking: Reported on 12/09/2023 06/02/11   [provider]  tazarotene  (AVAGE ) 0.1 % cream Apply topically at bedtime.    [provider]  azelastine (ASTELIN) 137 MCG/SPRAY nasal spray One spray in each nostril twice daily as needed for allergies.   04/24/11  [provider]    Current Outpatient Medications  Medication Sig Dispense Refill   acyclovir  (ZOVIRAX ) 400 MG tablet      AUVELITY 45-105 MG TBCR Take by mouth.     Azelastine-Fluticasone 137-50 MCG/ACT SUSP Place into the nose.     Doxepin HCl 6 MG TABS      esomeprazole  (NEXIUM ) 40 MG capsule Take 1 capsule (40 mg total) by mouth daily at 12 noon. 90 capsule 3   Eszopiclone 3 MG TABS Take 3 mg by mouth at bedtime. Take immediately before bedtime     fluticasone (FLONASE) 50 MCG/ACT nasal spray SMARTSIG:1-2 Spray(s) Both Nares Daily     levothyroxine  (SYNTHROID ) 88 MCG tablet Take 1 tablet (88 mcg total) by mouth daily. 90 tablet 0   loratadine  (CLARITIN ) 10 MG tablet Take 10 mg by mouth at bedtime.     Magnesium  400 MG CAPS Take by mouth.     montelukast (SINGULAIR) 10 MG tablet TK 1 T PO QD IN THE EVE  3   simvastatin  (ZOCOR ) 20 MG tablet TAKE 1 TABLET BY MOUTH EVERY NIGHT AT BEDTIME (Patient taking differently: Take 10 mg by mouth at bedtime.) 90 tablet 3   spironolactone (ALDACTONE) 50 MG tablet Take 50 mg by mouth 2 (two) times daily.      valACYclovir (VALTREX) 500 MG tablet Take 500 mg by mouth daily.     COMIRNATY syringe      EPIPEN 2-PAK 0.3 MG/0.3ML DEVI Inject 0.3 mg into the muscle daily as needed (allergic reaction). (Patient not taking: Reported on 12/09/2023)     tazarotene  (AVAGE ) 0.1 % cream Apply topically at bedtime.      Current Facility-Administered Medications  Medication Dose Route Frequency Provider Last Rate Last Admin   0.9 %  sodium chloride  infusion  500 mL Intravenous Continuous Tiphanie Vo V, DO       cyanocobalamin  (VITAMIN B12) injection 1,000 mcg  1,000 mcg Intramuscular Once Tabori, Katherine E, MD        Allergies as of 12/09/2023 - Review Complete 12/09/2023  Allergen Reaction Noted   Ciprofloxacin  Other (See Comments) 12/07/2013   Penicillins Itching 12/21/2019   Quinolones Other (See Comments) 12/13/2022   Aripiprazole Other (See Comments) 06/15/2010   Aripiprazole Other (See Comments) 06/15/2010   Escitalopram  oxalate Other (See Comments) 04/24/2011   Levocetirizine dihydrochloride Other (See Comments) 05/25/2021   Enalapril Rash 06/15/2010    Family History  Problem Relation Age of Onset   COPD Mother    Congestive Heart Failure Mother    Dementia Mother    COPD Father    Congestive Heart Failure Father    Depression Brother    Urolithiasis Brother    Arthritis Maternal Grandmother    Kidney cancer Maternal Grandfather    Eczema Daughter    Colon cancer Neg Hx    Esophageal cancer Neg Hx    Rectal cancer Neg Hx    Stomach cancer Neg Hx    Colon polyps Neg Hx     Social History   Socioeconomic History   Marital status: Divorced    Spouse name: Not on file   Number of children: 2   Years of education: Not on file   Highest education level: Master's degree (e.g., MA, MS, MEng, MEd, MSW, MBA)  Occupational History   Occupation: Tax Adviser: [employer]  Tobacco Use   Smoking status: Never   Smokeless tobacco: Never  Vaping Use   Vaping status: Never Used  Substance and Sexual Activity   Alcohol use: Not Currently   Drug use: No   Sexual activity: Not Currently    Birth control/protection: None  Other Topics Concern   Not on file  Social History Narrative   Not on file   Social Drivers of Health   Financial  Resource Strain: Low Risk  (08/06/2023)   Overall Financial Resource Strain (CARDIA)    Difficulty of Paying Living Expenses: Not hard at all  Food Insecurity: No Food Insecurity (08/06/2023)   Hunger Vital Sign    Worried About Running Out of Food in the Last Year: Never true    Ran Out of Food in the Last Year: Never true  Transportation Needs: No Transportation Needs (08/06/2023)   PRAPARE - Administrator, Civil Service (Medical): No    Lack of Transportation (Non-Medical): No  Physical Activity: Unknown (08/06/2023)   Exercise Vital Sign    Days of Exercise per Week: Patient declined    Minutes of Exercise per Session: Not on file  Stress: No Stress Concern Present (08/06/2023)   [employer] of Occupational Health - Occupational Stress Questionnaire    Feeling of Stress: Not at all  Social Connections: Moderately Isolated (08/06/2023)   Social Connection and Isolation Panel    Frequency of Communication with Friends and Family: More than three times a week    Frequency of Social Gatherings with Friends and Family: Once a week    Attends Religious Services: Never    Database Administrator or Organizations: Yes    Attends Engineer, Structural: More than 4 times per year    Marital Status: Divorced  Intimate Partner Violence: Not At Risk (08/06/2023)   Humiliation, Afraid, Rape, and Kick questionnaire    Fear of Current or Ex-Partner: No    Emotionally Abused: No    Physically Abused: No    Sexually Abused: No    Physical Exam: Vital signs in last 24 hours: @BP  (!) 150/92   Pulse 90   Temp 98.2 F (36.8 C)   Ht 5' 5 (1.651 m)   Wt 196 lb 12.8 oz (89.3 kg)   SpO2 97%   BMI 32.75 kg/m  GEN: NAD EYE: Sclerae anicteric ENT: MMM CV: Non-tachycardic Pulm: CTA  b/l GI: Soft, NT/ND NEURO:  Alert & Oriented x 3   Sandor Flatter, DO [facility]   12/09/2023 2:27 PM

## 2023-12-09 NOTE — Op Note (Signed)
 [facility] Patient Name: Marcia Adams Procedure Date: 12/09/2023 2:24 PM MRN: [id number] Endoscopist: Sandor Flatter , MD, [id number] Age: 68 Referring MD:  Date of Birth: 1956-01-17 Gender: Female Account #: [id number] Procedure:                Colonoscopy Indications:              High risk colon cancer surveillance: Personal                            history of colonic polyps                           -02/02/2014: Colonoscopy (Dr. Obie): 10 mm cecal                            adenoma, pandiverticulosis with associated colonic                            narrowing, tortuosity, angulation with 31-minute                            cecal intubation time                           -01/05/2019: Colonoscopy (Dr. Rosalie): Internal                            hemorrhoids, diverticulosis in the sigmoid colon                            through hepatic flexure. Repeat in 5 years Medicines:                Monitored Anesthesia Care Procedure:                Pre-Anesthesia Assessment:                           - Prior to the procedure, a History and Physical                            was performed, and patient medications and                            allergies were reviewed. The patient's tolerance of                            previous anesthesia was also reviewed. The risks                            and benefits of the procedure and the sedation                            options and risks were discussed with the patient.                            All questions were  answered, and informed consent                            was obtained. Prior Anticoagulants: The patient has                            taken no anticoagulant or antiplatelet agents. ASA                            Grade Assessment: II - A patient with mild systemic                            disease. After reviewing the risks and benefits,                            the patient was deemed in satisfactory condition  to                            undergo the procedure.                           After obtaining informed consent, the colonoscope                            was passed under direct vision. Throughout the                            procedure, the patient's blood pressure, pulse, and                            oxygen saturations were monitored continuously. The                            CF [id number] #[id number] was introduced through the anus                            and advanced to the the cecum, identified by                            appendiceal orifice and ileocecal valve. The                            colonoscopy was performed without difficulty. The                            patient tolerated the procedure well. The quality                            of the bowel preparation was good. The ileocecal                            valve, appendiceal orifice, and rectum were  photographed. Scope In: 2:39:13 PM Scope Out: 2:56:18 PM Scope Withdrawal Time: 0 hours 11 minutes 36 seconds  Total Procedure Duration: 0 hours 17 minutes 5 seconds  Findings:                 The perianal and digital rectal examinations were                            normal.                           A 3 mm polyp was found in the ascending colon. The                            polyp was sessile. The polyp was removed with a                            cold snare. Resection and retrieval were complete.                            Estimated blood loss was minimal.                           Multiple medium-mouthed and small-mouthed                            diverticula were found in the entire colon.                           Retroflexion in the rectum was not performed due to                            anatomy. Anterograde views were otherwise normal                            appearing. Complications:            No immediate complications. Estimated Blood Loss:     Estimated blood loss was  minimal. Impression:               - One 3 mm polyp in the ascending colon, removed                            with a cold snare. Resected and retrieved.                           - Diverticulosis in the entire examined colon. Recommendation:           - Patient has a contact number available for                            emergencies. The signs and symptoms of potential                            delayed complications were discussed with the  patient. Return to normal activities tomorrow.                            Written discharge instructions were provided to the                            patient.                           - Resume previous diet.                           - Continue present medications.                           - Await pathology results.                           - Will follow-up on pathology results, but if the                            polyp resected was a benign hyperplastic polyp, can                            likely forego any additional colon cancer screening.                           - Return to GI office PRN. Sandor Flatter, MD 12/09/2023 3:05:18 PM

## 2023-12-09 NOTE — Patient Instructions (Addendum)
 Resume previous diet  Continue present medications  Await pathology results  See handout on polyps and diverticulosis   YOU HAD AN ENDOSCOPIC PROCEDURE TODAY AT THE [facility]:   Refer to the procedure report that was given to you for any specific questions about what was found during the examination.  If the procedure report does not answer your questions, please call your gastroenterologist to clarify.  If you requested that your care partner not be given the details of your procedure findings, then the procedure report has been included in a sealed envelope for you to review at your convenience later.  YOU SHOULD EXPECT: Some feelings of bloating in the abdomen. Passage of more gas than usual.  Walking can help get rid of the air that was put into your GI tract during the procedure and reduce the bloating. If you had a lower endoscopy (such as a colonoscopy or flexible sigmoidoscopy) you may notice spotting of blood in your stool or on the toilet paper. If you underwent a bowel prep for your procedure, you may not have a normal bowel movement for a few days.  Please Note:  You might notice some irritation and congestion in your nose or some drainage.  This is from the oxygen used during your procedure.  There is no need for concern and it should clear up in a day or so.  SYMPTOMS TO REPORT IMMEDIATELY: Following lower endoscopy (colonoscopy or flexible sigmoidoscopy):  Excessive amounts of blood in the stool  Significant tenderness or worsening of abdominal pains  Swelling of the abdomen that is new, acute  Fever of 100F or higher  For urgent or emergent issues, a gastroenterologist can be reached at any hour by calling [phone]. Do not use MyChart messaging for urgent concerns.   DIET:  We do recommend a small meal at first, but then you may proceed to your regular diet.  Drink plenty of fluids but you should avoid alcoholic beverages for 24 hours.  ACTIVITY:  You  should plan to take it easy for the rest of today and you should NOT DRIVE or use heavy machinery until tomorrow (because of the sedation medicines used during the test).    FOLLOW UP: Our staff will call the number listed on your records the next business day following your procedure.  We will call around 7:15- 8:00 am to check on you and address any questions or concerns that you may have regarding the information given to you following your procedure. If we do not reach you, we will leave a message.     If any biopsies were taken you will be contacted by phone or by letter within the next 1-3 weeks.  Please call us  at [phone] if you have not heard about the biopsies in 3 weeks.   SIGNATURES/CONFIDENTIALITY: You and/or your care partner have signed paperwork which will be entered into your electronic medical record.  These signatures attest to the fact that that the information above on your After Visit Summary has been reviewed and is understood.  Full responsibility of the confidentiality of this discharge information lies with you and/or your care-partner.

## 2023-12-10 ENCOUNTER — Telehealth: Payer: Self-pay

## 2023-12-10 NOTE — Telephone Encounter (Signed)
  Follow up Call-     12/09/2023    1:48 PM 12/14/2022    8:39 AM  Call back number  Post procedure Call Back phone  # [phone]  Permission to leave phone message Yes Yes     Patient questions:  Do you have a fever, pain , or abdominal swelling? No. Pain Score  0 *  Have you tolerated food without any problems? Yes.    Have you been able to return to your normal activities? Yes.    Do you have any questions about your discharge instructions: Diet   No. Medications  No. Follow up visit  No.  Do you have questions or concerns about your Care? No.  Actions: * If pain score is 4 or above: No action needed, pain <4.

## 2023-12-11 ENCOUNTER — Ambulatory Visit (INDEPENDENT_AMBULATORY_CARE_PROVIDER_SITE_OTHER)

## 2023-12-11 DIAGNOSIS — E538 Deficiency of other specified B group vitamins: Secondary | ICD-10-CM | POA: Diagnosis not present

## 2023-12-11 MED ORDER — CYANOCOBALAMIN 1000 MCG/ML IJ SOLN
1000.0000 ug | Freq: Once | INTRAMUSCULAR | Status: AC
  Administered 2023-12-11: 1000 ug via INTRAMUSCULAR

## 2023-12-11 NOTE — Progress Notes (Signed)
 Patient is in office today for a nurse visit for B12 Injection. Patient Injection was given in the  Right deltoid. Patient tolerated injection well.

## 2023-12-12 LAB — SURGICAL PATHOLOGY

## 2023-12-16 ENCOUNTER — Ambulatory Visit: Payer: Self-pay | Admitting: Gastroenterology

## 2023-12-17 ENCOUNTER — Other Ambulatory Visit: Payer: Self-pay

## 2023-12-17 DIAGNOSIS — E038 Other specified hypothyroidism: Secondary | ICD-10-CM

## 2023-12-17 MED ORDER — LEVOTHYROXINE SODIUM 88 MCG PO TABS: 88.0000 ug | ORAL_TABLET | Freq: Every day | ORAL | 0 refills | Status: AC

## 2023-12-18 DIAGNOSIS — J3081 Allergic rhinitis due to animal (cat) (dog) hair and dander: Secondary | ICD-10-CM | POA: Diagnosis not present

## 2023-12-18 DIAGNOSIS — J301 Allergic rhinitis due to pollen: Secondary | ICD-10-CM | POA: Diagnosis not present

## 2023-12-18 DIAGNOSIS — J3089 Other allergic rhinitis: Secondary | ICD-10-CM | POA: Diagnosis not present

## 2023-12-31 DIAGNOSIS — J3081 Allergic rhinitis due to animal (cat) (dog) hair and dander: Secondary | ICD-10-CM | POA: Diagnosis not present

## 2023-12-31 DIAGNOSIS — J301 Allergic rhinitis due to pollen: Secondary | ICD-10-CM | POA: Diagnosis not present

## 2023-12-31 DIAGNOSIS — J3089 Other allergic rhinitis: Secondary | ICD-10-CM | POA: Diagnosis not present

## 2024-01-08 ENCOUNTER — Ambulatory Visit

## 2024-01-13 ENCOUNTER — Ambulatory Visit

## 2024-01-13 DIAGNOSIS — E538 Deficiency of other specified B group vitamins: Secondary | ICD-10-CM

## 2024-01-13 MED ORDER — CYANOCOBALAMIN 1000 MCG/ML IJ SOLN
1000.0000 ug | Freq: Once | INTRAMUSCULAR | Status: AC
  Administered 2024-01-13: 1000 ug via INTRAMUSCULAR

## 2024-01-13 NOTE — Progress Notes (Signed)
 Marcia Adams is a 68 y.o. female presents in office today for a nurse visit for #3rd B12 Injection.   Patient Injection was given in the  Left deltoid. Patient tolerated injection well.   Patient's next injection due 02/13/2024 for 4th b12 injection, appt made? yes  [employer]

## 2024-01-14 ENCOUNTER — Encounter: Payer: Self-pay | Admitting: Family Medicine

## 2024-01-14 NOTE — Telephone Encounter (Signed)
Updated in patients chart

## 2024-02-13 ENCOUNTER — Ambulatory Visit (INDEPENDENT_AMBULATORY_CARE_PROVIDER_SITE_OTHER)

## 2024-02-13 DIAGNOSIS — E538 Deficiency of other specified B group vitamins: Secondary | ICD-10-CM | POA: Diagnosis not present

## 2024-02-13 MED ORDER — CYANOCOBALAMIN 1000 MCG/ML IJ SOLN
1000.0000 ug | Freq: Once | INTRAMUSCULAR | Status: AC
  Administered 2024-02-13: 1000 ug via INTRAMUSCULAR

## 2024-02-13 NOTE — Progress Notes (Signed)
 Marcia Adams is a 69 y.o. female presents in office today for a nurse visit for B12 Injection.   Patient Injection was given in the  Right deltoid. Patient tolerated injection well.   Patient's next injection due 03/15/2024, appt made? yes  [facility] [place]

## 2024-02-28 ENCOUNTER — Other Ambulatory Visit: Payer: Self-pay | Admitting: Gastroenterology

## 2024-03-04 ENCOUNTER — Other Ambulatory Visit (HOSPITAL_COMMUNITY): Payer: Self-pay

## 2024-03-04 MED ORDER — AUVELITY 45-105 MG PO TBCR
1.0000 | EXTENDED_RELEASE_TABLET | Freq: Three times a day (TID) | ORAL | 10 refills | Status: DC
  Filled ????-??-??: fill #0

## 2024-03-05 ENCOUNTER — Other Ambulatory Visit: Payer: Self-pay

## 2024-03-05 ENCOUNTER — Other Ambulatory Visit (HOSPITAL_COMMUNITY): Payer: Self-pay

## 2024-03-05 MED ORDER — AUVELITY 45-105 MG PO TBCR
1.0000 | EXTENDED_RELEASE_TABLET | Freq: Three times a day (TID) | ORAL | 11 refills | Status: DC
  Filled 2024-03-05: qty 60, 20d supply, fill #0

## 2024-03-13 ENCOUNTER — Other Ambulatory Visit (HOSPITAL_COMMUNITY): Payer: Self-pay

## 2024-03-25 ENCOUNTER — Ambulatory Visit

## 2024-03-25 ENCOUNTER — Ambulatory Visit: Admitting: Family Medicine

## 2024-08-11 ENCOUNTER — Encounter
